# Patient Record
Sex: Male | Born: 1950 | Race: White | Hispanic: No | Smoking: Former smoker
Health system: Southern US, Community
[De-identification: ages and names within clinical notes are randomized; demographics above are authoritative.]

## PROBLEM LIST (undated history)

## (undated) DIAGNOSIS — M109 Gout, unspecified: Secondary | ICD-10-CM

## (undated) DIAGNOSIS — E7800 Pure hypercholesterolemia, unspecified: Secondary | ICD-10-CM

## (undated) DIAGNOSIS — I2510 Atherosclerotic heart disease of native coronary artery without angina pectoris: Secondary | ICD-10-CM

## (undated) DIAGNOSIS — M419 Scoliosis, unspecified: Secondary | ICD-10-CM

## (undated) DIAGNOSIS — I2699 Other pulmonary embolism without acute cor pulmonale: Secondary | ICD-10-CM

## (undated) DIAGNOSIS — I82409 Acute embolism and thrombosis of unspecified deep veins of unspecified lower extremity: Secondary | ICD-10-CM

## (undated) DIAGNOSIS — C801 Malignant (primary) neoplasm, unspecified: Secondary | ICD-10-CM

## (undated) DIAGNOSIS — J449 Chronic obstructive pulmonary disease, unspecified: Secondary | ICD-10-CM

## (undated) DIAGNOSIS — I10 Essential (primary) hypertension: Secondary | ICD-10-CM

## (undated) HISTORY — DX: Gout, unspecified: M10.9

## (undated) HISTORY — PX: NECK SURGERY: SHX720

## (undated) HISTORY — PX: SKIN CANCER EXCISION: SHX779

## (undated) HISTORY — PX: FOOT SURGERY: SHX648

---

## 2008-12-16 ENCOUNTER — Ambulatory Visit (HOSPITAL_COMMUNITY): Admission: RE | Admit: 2008-12-16 | Discharge: 2008-12-16 | Payer: Self-pay | Admitting: Family Medicine

## 2010-04-02 ENCOUNTER — Ambulatory Visit (HOSPITAL_COMMUNITY): Admission: RE | Admit: 2010-04-02 | Discharge: 2010-04-02 | Payer: Self-pay | Admitting: Family Medicine

## 2010-07-16 ENCOUNTER — Ambulatory Visit: Payer: Self-pay | Admitting: Orthopedic Surgery

## 2010-07-16 DIAGNOSIS — M6530 Trigger finger, unspecified finger: Secondary | ICD-10-CM | POA: Insufficient documentation

## 2010-07-16 DIAGNOSIS — M47812 Spondylosis without myelopathy or radiculopathy, cervical region: Secondary | ICD-10-CM | POA: Insufficient documentation

## 2010-07-24 ENCOUNTER — Encounter: Payer: Self-pay | Admitting: Orthopedic Surgery

## 2010-07-26 ENCOUNTER — Telehealth: Payer: Self-pay | Admitting: Orthopedic Surgery

## 2010-07-27 ENCOUNTER — Ambulatory Visit (HOSPITAL_COMMUNITY): Admission: RE | Admit: 2010-07-27 | Discharge: 2010-07-27 | Payer: Self-pay | Admitting: Orthopedic Surgery

## 2010-07-30 ENCOUNTER — Encounter: Payer: Self-pay | Admitting: Orthopedic Surgery

## 2010-07-31 ENCOUNTER — Encounter: Payer: Self-pay | Admitting: Orthopedic Surgery

## 2010-08-13 ENCOUNTER — Ambulatory Visit: Payer: Self-pay | Admitting: Orthopedic Surgery

## 2010-08-14 ENCOUNTER — Telehealth: Payer: Self-pay | Admitting: Orthopedic Surgery

## 2010-08-14 ENCOUNTER — Encounter: Payer: Self-pay | Admitting: Orthopedic Surgery

## 2010-08-21 ENCOUNTER — Encounter: Payer: Self-pay | Admitting: Orthopedic Surgery

## 2010-08-21 ENCOUNTER — Telehealth (INDEPENDENT_AMBULATORY_CARE_PROVIDER_SITE_OTHER): Payer: Self-pay | Admitting: *Deleted

## 2010-09-18 ENCOUNTER — Encounter: Payer: Self-pay | Admitting: Orthopedic Surgery

## 2010-09-27 ENCOUNTER — Encounter: Payer: Self-pay | Admitting: Orthopedic Surgery

## 2010-10-05 ENCOUNTER — Ambulatory Visit (HOSPITAL_COMMUNITY): Admission: RE | Admit: 2010-10-05 | Discharge: 2010-10-05 | Payer: Self-pay | Admitting: Neurosurgery

## 2010-10-30 ENCOUNTER — Encounter: Payer: Self-pay | Admitting: Orthopedic Surgery

## 2010-11-29 ENCOUNTER — Encounter: Payer: Self-pay | Admitting: Orthopedic Surgery

## 2010-12-20 NOTE — Progress Notes (Signed)
Summary: Referral to Neurosurgeon.  Phone Note Outgoing Call   Call placed by: Waldon Reining,  August 14, 2010 1:35 PM Call placed to: Specialist Action Taken: Information Sent Summary of Call: I faxed a referral for this patient to [facility] to be seen for spinal stenosis of C-spine.

## 2010-12-20 NOTE — Letter (Signed)
Summary: History form  History form   Imported By: Jacklynn Ganong 07/30/2010 15:34:13  _____________________________________________________________________  External Attachment:    Type:   Image     Comment:   External Document

## 2010-12-20 NOTE — Letter (Signed)
Summary: *Referral Letter [facility])  [facility]  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   Pertinent Labs: MRI  Thank you again for agreeing to see our patient; please contact us if you have any further questions or need additional information.  Sincerely,  Fuller Canada MD

## 2010-12-20 NOTE — Progress Notes (Signed)
Summary: Dr. Franky Macho appt 08/27/10  Phone Note Outgoing Call Call back at [facility] Phone [phone]   Summary of Call: called patient and lmom to confirm appt with Dr. Franky Macho on 08/27/10, left [facility] number for pt to call for time of appt, also gave our number for pt to call us back to confirm that he received our message, no appt time was given from [facility] appt confirmation form only Date.

## 2010-12-20 NOTE — Letter (Signed)
Summary: Insurance MRI notification  Insurance MRI notification   Imported By: Jacklynn Ganong 07/30/2010 15:47:30  _____________________________________________________________________  External Attachment:    Type:   Image     Comment:   External Document

## 2010-12-20 NOTE — Assessment & Plan Note (Signed)
Summary: mri results/disc by courier/bsf   Visit Type:  Follow-up Referring Provider:  Dr. Sherwood Gambler Primary Provider:  Dr. Sherwood Gambler   History of Present Illness: I saw Dwayne Cuevas in the office today for a followup visit.  He is a 60 years old man with the complaint of:  numbness and tingling with weakness of the right upper extremity   MRI results:  IMPRESSION: 1.  Mild spinal stenosis without mass effect on the spinal cord or spinal cord signal abnormality is multifactorial at C5-C6. Associated severe bilateral C6 foraminal stenosis. 2.  Moderate to severe multifactorial right C4 foraminal stenosis. 3.  Borderline C6-C7 spinal stenosis and mild bilateral C7 foraminal stenosis.   Read By:  Augusto Gamble,  M.D.      I have recommended a neuro surgical consult for him   Allergies: 1)  ! 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 Patient Level II (03500)  Patient Instructions: 1)  CONSULT NEURO SURGERY  2)  SPINAL STENOSIS C SPINE  3)  RETURN FOR TRIGGER THUMB IF IT CONTINUES TO LOCK

## 2010-12-20 NOTE — Consult Note (Signed)
Summary: Consult note from Dr. Coletta Memos  Consult note from Dr. Coletta Memos   Imported By: Jacklynn Ganong 09/18/2010 11:45:52  _____________________________________________________________________  External Attachment:    Type:   Image     Comment:   External Document

## 2010-12-20 NOTE — Assessment & Plan Note (Signed)
Summary: EVAL/TREAT TRIGGER FINGER RT THUMB/REF L.FUSCO/[employer]/CAF   Vital Signs:  Patient profile:   60 year old male Height:      72 inches Weight:      191 pounds Pulse rate:   70 / minute Resp:     16 per minute  Vitals Entered By: Fuller Canada MD (July 16, 2010 4:37 PM)  Visit Type:  new patient Referring Provider:  Dr. Sherwood Gambler Primary Provider:  Dr. Sherwood Gambler  CC:  right thumb pain.  History of Present Illness: I saw Dwayne Cuevas in the office today for an initial visit.  He is a 60 years old man with the complaint of:  right trigger thumb.  No xrays.  meds: Welchol, Simvastatin, Fenofibrate, HCTZ, Cialis, Loratadine.  RIGHT trigger thumb for this 60 year old male causing sharp throbbing burning moderate pain.  Basically been triggering and having pain for the last 2 months of locking.  Notes some numbness gradual onset.  Pain comes and goes.      Allergies (verified): 1)  ! 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  Review of Systems Constitutional:  Denies weight loss, weight gain, fever, chills, and fatigue. Cardiovascular:  Denies chest pain, palpitations, fainting, and murmurs. Respiratory:  Denies short of breath, wheezing, couch, tightness, pain on inspiration, and snoring . Gastrointestinal:  Complains of heartburn; denies nausea, vomiting, diarrhea, constipation, and blood in your stools. Genitourinary:  Denies frequency, urgency, difficulty urinating, painful urination, flank pain, and bleeding in urine. Neurologic:  Denies numbness, tingling, unsteady gait, dizziness, tremors, and seizure. Musculoskeletal:  Complains of joint pain; denies swelling, instability,  stiffness, redness, heat, and muscle pain. Endocrine:  Denies excessive thirst, exessive urination, and heat or cold intolerance. Psychiatric:  Denies nervousness, depression, anxiety, and hallucinations. Skin:  Denies changes in the skin, poor healing, rash, itching, and redness. HEENT:  Denies blurred or double vision, eye pain, redness, and watering. Immunology:  Complains of seasonal allergies; denies sinus problems and allergic to bee stings. Hemoatologic:  Denies easy bleeding and brusing.  Physical Exam  Additional Exam:  He has tenderness over the A1 pulley of the RIGHT thumb with catching and locking although the thumb remained stable , grip strength is normal skin is intact pulses are normal sensation is normal.  His thumb is stable  GEN: well developed, well nourished, normal grooming and hygiene, no deformity and normal body habitus.   CDV: pulses are normal, no edema, no erythema. no tenderness  Lymph: normal lymph nodes   Skin: no rashes, skin lesions or open sores   NEURO: normal coordination, reflexes, the abnormal sensation seems to be in the long finger somewhat in the palm and somewhat over the thumb suggesting C7 issue.  Psyche: awake, alert and oriented. Mood normal      Impression & Recommendations:  Problem # 1:  TRIGGER FINGER (ICD-727.03) Assessment New  Orders: New Patient Level III (81191) Injection, Tendon / Ligament (47829) Depo- Medrol 40mg  (J1030) Verbal consent was obtained: The finger was prepped with ethyl chloride and injected with 1:1 injection of .25% sensorcaine, 1cc  and 40 mg of depomedrol, 1cc. There were no complications. 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  Recommend MRI  Medications Added to Medication List This Visit: 1)  Valium 10 Mg Tabs (Diazepam) .[name] Kitchen.. 1 tablet one tablet 30 minutes before mri  Patient Instructions: 1)  MRI neck will call with results. 2)  You have received an injection of cortisone today. You may experience increased pain at the injection site. Apply ice pack to the area for 20 minutes every 2 hours and take 2 xtra strength tylenol every 8 hours. This increased pain will usually resolve in 24 hours. The injection will take effect in 3-10 days.  Prescriptions: VALIUM 10 MG TABS (DIAZEPAM) 1 tablet one tablet 30 minutes before MRI  #1 x 0   Entered and Authorized by:   Fuller Canada MD   Signed by:   Fuller Canada MD on 07/16/2010   Method used:   Print then Give to Patient   RxID:   [id number]

## 2010-12-20 NOTE — Letter (Signed)
Summary: [facility] Office notes Dr Letitia Libra Office notes Dr Franky Macho   Imported By: Cammie Sickle 11/21/2010 10:35:49  _____________________________________________________________________  External Attachment:    Type:   Image     Comment:   External Document

## 2010-12-20 NOTE — Miscellaneous (Signed)
  MRI approved by physician to physician conversationClinical Lists Changes

## 2010-12-20 NOTE — Letter (Signed)
Summary: [facility] referral appointment confirmation  [facility] referral appointment confirmation   Imported By: Jacklynn Ganong 08/21/2010 16:27:45  _____________________________________________________________________  External Attachment:    Type:   Image     Comment:   External Document

## 2010-12-20 NOTE — Progress Notes (Signed)
Summary: MRI appointment.  Phone Note Outgoing Call   Call placed by: Waldon Reining,  July 26, 2010 3:11 PM Call placed to: Patient Action Taken: Appt scheduled Summary of Call: I called and left a message with th epatient's MRI appointment at [facility] on 07-27-10 at 5:30 pm. Patient has [facility], authorization [phone]. Patient will follow up here for his results.

## 2010-12-20 NOTE — Letter (Signed)
Summary: MRI insurance notification  MRI insurance notification   Imported By: Jacklynn Ganong 07/31/2010 14:31:29  _____________________________________________________________________  External Attachment:    Type:   Image     Comment:   External Document

## 2010-12-20 NOTE — Letter (Signed)
Summary: [facility] office notes Dr Letitia Libra office notes Dr Franky Macho   Imported By: Cammie Sickle 10/12/2010 14:28:06  _____________________________________________________________________  External Attachment:    Type:   Image     Comment:   External Document

## 2011-01-24 NOTE — Letter (Signed)
Summary: [facility] office note Dr Letitia Libra office note Dr Franky Macho   Imported By: Cammie Sickle 01/15/2011 20:08:39  _____________________________________________________________________  External Attachment:    Type:   Image     Comment:   External Document

## 2011-01-29 LAB — BASIC METABOLIC PANEL
BUN: 16 mg/dL (ref 6–23)
CO2: 30 mEq/L (ref 19–32)
Calcium: 9.6 mg/dL (ref 8.4–10.5)
Chloride: 104 mEq/L (ref 96–112)
Creatinine, Ser: 1.18 mg/dL (ref 0.4–1.5)
GFR calc Af Amer: >60 mL/min (ref >60)
GFR calc non Af Amer: >60 mL/min (ref >60)
Glucose, Bld: 94 mg/dL (ref 70–99)
Potassium: 4.3 mEq/L (ref 3.5–5.1)
Sodium: 140 mEq/L (ref 135–145)

## 2011-01-29 LAB — CBC
HCT: 44.6 % (ref 39.0–52.0)
Hemoglobin: 15.1 g/dL (ref 13.0–17.0)
MCH: 30.2 pg (ref 26.0–34.0)
MCHC: 33.9 g/dL (ref 30.0–36.0)
MCV: 89.2 fL (ref 78.0–100.0)
Platelets: 289 10*3/uL (ref 150–400)
RBC: 5 MIL/uL (ref 4.22–5.81)
RDW: 12.5 % (ref 11.5–15.5)
WBC: 8.2 10*3/uL (ref 4.0–10.5)

## 2011-01-29 LAB — SURGICAL PCR SCREEN
MRSA, PCR: NEGATIVE
Staphylococcus aureus: NEGATIVE

## 2012-02-24 ENCOUNTER — Encounter (INDEPENDENT_AMBULATORY_CARE_PROVIDER_SITE_OTHER): Payer: Self-pay | Admitting: *Deleted

## 2012-03-04 ENCOUNTER — Encounter (INDEPENDENT_AMBULATORY_CARE_PROVIDER_SITE_OTHER): Payer: Self-pay

## 2012-12-10 ENCOUNTER — Other Ambulatory Visit (HOSPITAL_COMMUNITY): Payer: Self-pay | Admitting: *Deleted

## 2012-12-10 ENCOUNTER — Ambulatory Visit (HOSPITAL_COMMUNITY)
Admission: RE | Admit: 2012-12-10 | Discharge: 2012-12-10 | Disposition: A | Discharge status: Home / Self Care | Payer: PRIVATE HEALTH INSURANCE | Source: Ambulatory Visit | Attending: Orthopedic Surgery | Admitting: Orthopedic Surgery

## 2012-12-10 DIAGNOSIS — M542 Cervicalgia: Secondary | ICD-10-CM

## 2013-01-04 ENCOUNTER — Other Ambulatory Visit (HOSPITAL_COMMUNITY): Payer: Self-pay | Admitting: *Deleted

## 2013-01-04 ENCOUNTER — Ambulatory Visit (HOSPITAL_COMMUNITY)
Admission: RE | Admit: 2013-01-04 | Discharge: 2013-01-04 | Disposition: A | Discharge status: Home / Self Care | Payer: PRIVATE HEALTH INSURANCE | Source: Ambulatory Visit | Attending: Family Medicine | Admitting: Family Medicine

## 2013-01-04 DIAGNOSIS — S3992XA Unspecified injury of lower back, initial encounter: Secondary | ICD-10-CM

## 2013-01-04 DIAGNOSIS — R52 Pain, unspecified: Secondary | ICD-10-CM

## 2013-01-04 DIAGNOSIS — M549 Dorsalgia, unspecified: Secondary | ICD-10-CM

## 2013-01-04 DIAGNOSIS — M5380 Other specified dorsopathies, site unspecified: Secondary | ICD-10-CM | POA: Insufficient documentation

## 2013-01-04 DIAGNOSIS — M25579 Pain in unspecified ankle and joints of unspecified foot: Secondary | ICD-10-CM | POA: Insufficient documentation

## 2013-01-04 DIAGNOSIS — M546 Pain in thoracic spine: Secondary | ICD-10-CM | POA: Insufficient documentation

## 2013-01-04 DIAGNOSIS — M25559 Pain in unspecified hip: Secondary | ICD-10-CM | POA: Insufficient documentation

## 2016-02-05 ENCOUNTER — Inpatient Hospital Stay (HOSPITAL_COMMUNITY)
Admission: EM | Admit: 2016-02-05 | Discharge: 2016-02-07 | DRG: 299 | Disposition: A | Discharge status: Home / Self Care | Payer: Medicare Other | Source: Ambulatory Visit | Attending: Internal Medicine | Admitting: Internal Medicine

## 2016-02-05 ENCOUNTER — Emergency Department (HOSPITAL_COMMUNITY): Payer: Medicare Other

## 2016-02-05 ENCOUNTER — Ambulatory Visit (HOSPITAL_COMMUNITY)
Admission: RE | Admit: 2016-02-05 | Discharge: 2016-02-05 | Disposition: A | Discharge status: Home / Self Care | Payer: Medicare Other | Source: Ambulatory Visit | Attending: Preventative Medicine | Admitting: Preventative Medicine

## 2016-02-05 ENCOUNTER — Encounter (HOSPITAL_COMMUNITY): Payer: Self-pay | Admitting: Emergency Medicine

## 2016-02-05 ENCOUNTER — Other Ambulatory Visit: Payer: Self-pay

## 2016-02-05 ENCOUNTER — Other Ambulatory Visit (HOSPITAL_COMMUNITY): Payer: Self-pay | Admitting: Preventative Medicine

## 2016-02-05 DIAGNOSIS — R52 Pain, unspecified: Secondary | ICD-10-CM

## 2016-02-05 DIAGNOSIS — M109 Gout, unspecified: Secondary | ICD-10-CM | POA: Diagnosis not present

## 2016-02-05 DIAGNOSIS — F17210 Nicotine dependence, cigarettes, uncomplicated: Secondary | ICD-10-CM | POA: Diagnosis present

## 2016-02-05 DIAGNOSIS — I10 Essential (primary) hypertension: Secondary | ICD-10-CM | POA: Diagnosis not present

## 2016-02-05 DIAGNOSIS — I82409 Acute embolism and thrombosis of unspecified deep veins of unspecified lower extremity: Secondary | ICD-10-CM | POA: Diagnosis not present

## 2016-02-05 DIAGNOSIS — I2699 Other pulmonary embolism without acute cor pulmonale: Secondary | ICD-10-CM | POA: Diagnosis present

## 2016-02-05 DIAGNOSIS — I82401 Acute embolism and thrombosis of unspecified deep veins of right lower extremity: Principal | ICD-10-CM | POA: Diagnosis present

## 2016-02-05 DIAGNOSIS — J449 Chronic obstructive pulmonary disease, unspecified: Secondary | ICD-10-CM | POA: Diagnosis not present

## 2016-02-05 DIAGNOSIS — Z23 Encounter for immunization: Secondary | ICD-10-CM

## 2016-02-05 DIAGNOSIS — Z880 Allergy status to penicillin: Secondary | ICD-10-CM

## 2016-02-05 DIAGNOSIS — Z7401 Bed confinement status: Secondary | ICD-10-CM

## 2016-02-05 DIAGNOSIS — R609 Edema, unspecified: Secondary | ICD-10-CM

## 2016-02-05 DIAGNOSIS — M7989 Other specified soft tissue disorders: Secondary | ICD-10-CM | POA: Diagnosis not present

## 2016-02-05 DIAGNOSIS — I4510 Unspecified right bundle-branch block: Secondary | ICD-10-CM | POA: Diagnosis present

## 2016-02-05 DIAGNOSIS — I82411 Acute embolism and thrombosis of right femoral vein: Secondary | ICD-10-CM | POA: Diagnosis not present

## 2016-02-05 HISTORY — DX: Pure hypercholesterolemia, unspecified: E78.00

## 2016-02-05 HISTORY — DX: Chronic obstructive pulmonary disease, unspecified: J44.9

## 2016-02-05 HISTORY — DX: Acute embolism and thrombosis of unspecified deep veins of unspecified lower extremity: I82.409

## 2016-02-05 HISTORY — DX: Other pulmonary embolism without acute cor pulmonale: I26.99

## 2016-02-05 HISTORY — DX: Scoliosis, unspecified: M41.9

## 2016-02-05 HISTORY — DX: Essential (primary) hypertension: I10

## 2016-02-05 LAB — CBC WITH DIFFERENTIAL/PLATELET
Basophils Absolute: 0 10*3/uL (ref 0.0–0.1)
Basophils Relative: 1 %
Eosinophils Absolute: 0.2 10*3/uL (ref 0.0–0.7)
Eosinophils Relative: 2 %
HCT: 45.2 % (ref 39.0–52.0)
Hemoglobin: 15.4 g/dL (ref 13.0–17.0)
Lymphocytes Relative: 27 %
Lymphs Abs: 2.3 10*3/uL (ref 0.7–4.0)
MCH: 36.1 pg — ABNORMAL HIGH (ref 26.0–34.0)
MCHC: 34.1 g/dL (ref 30.0–36.0)
MCV: 105.9 fL — ABNORMAL HIGH (ref 78.0–100.0)
Monocytes Absolute: 0.7 10*3/uL (ref 0.1–1.0)
Monocytes Relative: 8 %
Neutro Abs: 5.5 10*3/uL (ref 1.7–7.7)
Neutrophils Relative %: 62 %
Platelets: 317 10*3/uL (ref 150–400)
RBC: 4.27 MIL/uL (ref 4.22–5.81)
RDW: 15 % (ref 11.5–15.5)
WBC: 8.7 10*3/uL (ref 4.0–10.5)

## 2016-02-05 LAB — BASIC METABOLIC PANEL
Anion gap: 8 (ref 5–15)
BUN: 11 mg/dL (ref 6–20)
CO2: 30 mmol/L (ref 22–32)
Calcium: 9 mg/dL (ref 8.9–10.3)
Chloride: 104 mmol/L (ref 101–111)
Creatinine, Ser: 0.91 mg/dL (ref 0.61–1.24)
GFR calc Af Amer: >60 mL/min (ref >60)
GFR calc non Af Amer: >60 mL/min (ref >60)
Glucose, Bld: 95 mg/dL (ref 65–99)
Potassium: 3.9 mmol/L (ref 3.5–5.1)
Sodium: 142 mmol/L (ref 135–145)

## 2016-02-05 LAB — PROTIME-INR
INR: 1.09 (ref 0.00–1.49)
Prothrombin Time: 14.3 seconds (ref 11.6–15.2)

## 2016-02-05 MED ORDER — ONDANSETRON HCL 4 MG PO TABS: 4.0000 mg | ORAL_TABLET | Freq: Four times a day (QID) | ORAL | Status: DC | PRN

## 2016-02-05 MED ORDER — ACETAMINOPHEN 650 MG RE SUPP: 650.0000 mg | Freq: Four times a day (QID) | RECTAL | Status: DC | PRN

## 2016-02-05 MED ORDER — COLCHICINE 0.6 MG PO TABS
0.6000 mg | ORAL_TABLET | Freq: Two times a day (BID) | ORAL | Status: DC
  Administered 2016-02-06 – 2016-02-07 (×3): 0.6 mg via ORAL
  Filled 2016-02-05 (×3): qty 1

## 2016-02-05 MED ORDER — RIVAROXABAN 15 MG PO TABS: 15.0000 mg | ORAL_TABLET | Freq: Once | ORAL | Status: DC

## 2016-02-05 MED ORDER — COLCHICINE 0.6 MG PO TABS
0.6000 mg | ORAL_TABLET | Freq: Two times a day (BID) | ORAL | Status: DC
  Administered 2016-02-05: 0.6 mg via ORAL
  Filled 2016-02-05: qty 1

## 2016-02-05 MED ORDER — ACETAMINOPHEN 325 MG PO TABS
650.0000 mg | ORAL_TABLET | Freq: Four times a day (QID) | ORAL | Status: DC | PRN
  Administered 2016-02-06: 650 mg via ORAL
  Filled 2016-02-05: qty 2

## 2016-02-05 MED ORDER — IOHEXOL 350 MG/ML SOLN
100.0000 mL | Freq: Once | INTRAVENOUS | Status: AC | PRN
  Administered 2016-02-05: 100 mL via INTRAVENOUS

## 2016-02-05 MED ORDER — SODIUM CHLORIDE 0.9 % IV SOLN: INTRAVENOUS | Status: DC

## 2016-02-05 MED ORDER — ONDANSETRON HCL 4 MG/2ML IJ SOLN: 4.0000 mg | Freq: Four times a day (QID) | INTRAMUSCULAR | Status: DC | PRN

## 2016-02-05 MED ORDER — HEPARIN BOLUS VIA INFUSION
4000.0000 [IU] | Freq: Once | INTRAVENOUS | Status: AC
  Administered 2016-02-05: 4000 [IU] via INTRAVENOUS

## 2016-02-05 MED ORDER — HEPARIN (PORCINE) IN NACL 100-0.45 UNIT/ML-% IJ SOLN
1500.0000 [IU]/h | INTRAMUSCULAR | Status: DC
  Administered 2016-02-05: 1000 [IU]/h via INTRAVENOUS
  Administered 2016-02-06: 1500 [IU]/h via INTRAVENOUS
  Filled 2016-02-05 (×2): qty 250

## 2016-02-05 NOTE — ED Provider Notes (Signed)
CSN: [id number]     Arrival date & time 02/05/16  1621 History   First MD Initiated Contact with Patient 02/05/16 1718     Chief Complaint  Patient presents with  . DVT     (Consider location/radiation/quality/duration/timing/severity/associated sxs/prior Treatment) HPI Dwayne Cuevas is a 65 year old man sent to the hospital for urgent care to have a Doppler of his right lower extremity. It was positive for DVT and he was subsequently sent to the emergency department for evaluation. He states that he has been having leg swelling over the past week. It has worsened in nature. He thought that it was his gout which he has had before.  He does endorse a has had some dyspnea. He has not noted any fever or chills. He denies any history of immobilization, long travel, or blood dyscrasias or bleeding diatheses. He has not had any  DVT or pulmonary embolism in the past. Past Medical History  Diagnosis Date  . DVT (deep venous thrombosis) ([place])   . Hypertension   . Hypercholesteremia   . COPD (chronic obstructive pulmonary disease) ([place])   . Scoliosis    Past Surgical History  Procedure Laterality Date  . Back surgery     No family history on file. Social History  Substance Use Topics  . Smoking status: Current Every Day Smoker -- 1.00 packs/day    Types: Cigarettes  . Smokeless tobacco: None  . Alcohol Use: No    Review of Systems  All other systems reviewed and are negative.     Allergies  Penicillins  Home Medications   Prior to Admission medications   Not on File   BP 155/75 mmHg  Pulse 77  Temp(Src) 98.7 F (37.1 C) (Oral)  Resp 16  Ht 6' (1.829 m)  Wt 84.369 kg  BMI 25.22 kg/m2  SpO2 100% Physical Exam  Constitutional: He is oriented to person, place, and time. He appears well-developed and well-nourished.  HENT:  Head: Normocephalic and atraumatic.  Right Ear: External ear normal.  Left Ear: External ear normal.  Nose: Nose normal.  Mouth/Throat: Oropharynx is  clear and moist.  Eyes: Conjunctivae and EOM are normal. Pupils are equal, round, and reactive to light.  Neck: Normal range of motion. Neck supple.  Cardiovascular: Normal rate, regular rhythm, normal heart sounds and intact distal pulses.   Pulmonary/Chest: Effort normal and breath sounds normal. No respiratory distress. He has no wheezes. He exhibits no tenderness.  Abdominal: Soft. Bowel sounds are normal. He exhibits no distension and no mass. There is no tenderness. There is no guarding.  Musculoskeletal: Normal range of motion. He exhibits edema and tenderness.  Edema and tenderness with erythema of right lower extremity from knee through the foot. Dorsal pedalis pulses are palpable.  Neurological: He is alert and oriented to person, place, and time. He has normal reflexes. He exhibits normal muscle tone. Coordination normal.  Skin: Skin is warm and dry.  Psychiatric: He has a normal mood and affect. His behavior is normal. Judgment and thought content normal.  Nursing note and vitals reviewed.   ED Course  Procedures (including critical care time) Labs Review Labs Reviewed  CBC WITH DIFFERENTIAL/PLATELET - Abnormal; Notable for the following:    MCV 105.9 (*)    MCH 36.1 (*)    All other components within normal limits  BASIC METABOLIC PANEL  PROTIME-INR    Imaging Review Ct Angio Chest Pe W/cm &/or Wo Cm  02/05/2016  CLINICAL DATA:  Diagnosed with DVT  today. Right leg swelling for 2 weeks. Mild shortness of breath. EXAM: CT ANGIOGRAPHY CHEST WITH CONTRAST TECHNIQUE: Multidetector CT imaging of the chest was performed using the standard protocol during bolus administration of intravenous contrast. Multiplanar CT image reconstructions and MIPs were obtained to evaluate the vascular anatomy. CONTRAST:  148mL OMNIPAQUE IOHEXOL 350 MG/ML SOLN COMPARISON:  None. FINDINGS: Technically adequate study with good opacification of the central and segmental pulmonary arteries. Large filling  defect demonstrated in the distal right main pulmonary artery, extending into multiple upper and lower lobe branches. Appearance is consistent with acute pulmonary embolus. RV to LV ratio is less than 1 suggesting no evidence of right heart strain. Normal heart size. Normal caliber thoracic aorta. No aortic dissection. Great vessel origins are patent. Calcification of the thoracic aorta and coronary arteries. Scattered lymph nodes in mediastinum are not pathologically enlarged and likely reactive. Esophagus is decompressed. Small right pleural effusion with patchy infiltration in the right lower lung possibly representing infarcts. Mild emphysematous changes in the lung apices with scattered peripheral fibrosis. No focal consolidation. No pneumothorax. Airways appear patent. Included portions of the upper abdominal organs demonstrate diffuse fatty infiltration of the liver. Vague focal lesion demonstrated in segment 7 measuring 11 mm diameter. This is of indeterminate etiology cannot be characterized on this study. Degenerative changes throughout the spine. Postoperative changes in the lower cervical spine. No destructive bone lesions. Review of the MIP images confirms the above findings. IMPRESSION: Positive examination for pulmonary embolus with involvement of the distal right main pulmonary artery and multiple upper and lower lobe branches. Small right pleural effusion with patchy infiltration may indicate pulmonary infarct. No evidence of right heart strain. These results were called by telephone at the time of interpretation on 02/05/2016 at 7:08 pm to Dr. Pattricia Boss , who verbally acknowledged these results. Electronically Signed   By: Lucienne Capers M.D.   On: 02/05/2016 19:11   US Venous Img Lower Unilateral Right  02/05/2016  CLINICAL DATA:  Right lower extremity pain and edema for the past 2 weeks. History of smoking and varicose veins. Evaluate for DVT. EXAM: RIGHT LOWER EXTREMITY VENOUS DOPPLER  ULTRASOUND TECHNIQUE: Gray-scale sonography with graded compression, as well as color Doppler and duplex ultrasound were performed to evaluate the lower extremity deep venous systems from the level of the common femoral vein and including the common femoral, femoral, profunda femoral, popliteal and calf veins including the posterior tibial, peroneal and gastrocnemius veins when visible. The superficial great saphenous vein was also interrogated. Spectral Doppler was utilized to evaluate flow at rest and with distal augmentation maneuvers in the common femoral, femoral and popliteal veins. COMPARISON:  None. FINDINGS: There is hypoechoic occlusive thrombus extending from the level of the right common femoral vein throughout the entirety of the imaged portions of the right lower extremity venous system, including the saphenofemoral junction and imaged portions of the right greater saphenous vein. The contralateral left common femoral vein appears patent were imaged. Other Findings:  None. IMPRESSION: The examination is positive for extensive occlusive right lower extremity DVT extending from the right common femoral vein to involve all imaged venous structures of the right lower extremity. These results will be called to the ordering clinician or representative by the Radiologist Assistant, and communication documented in the PACS or zVision Dashboard. Electronically Signed   By: Sandi Mariscal M.D.   On: 02/05/2016 15:22   I have personally reviewed and evaluated these images and lab results as part of my medical  decision-making.   EKG Interpretation None      MDM   Final diagnoses:  Other acute pulmonary embolism without acute cor pulmonale ([place])   65 year old man with DVT presents to the ED for treatment. CT angiogram of the chest is significant for pulmonary embolism about heart strain. Patient is started on heparin and was admitted to hospitalist. He remained hemodynamically stable here in the ED. EKG  is currently pending   Pattricia Boss, MD 02/05/16 2210

## 2016-02-05 NOTE — ED Notes (Signed)
Dinner tray given to patient

## 2016-02-05 NOTE — ED Notes (Signed)
Pt sent over by [facility] for a DVT in RT leg. Pt had [place] earlier today. Pt reports mild SOB and RT leg pain/swelling.

## 2016-02-05 NOTE — Progress Notes (Signed)
ANTICOAGULATION CONSULT NOTE - Initial Consult  Pharmacy Consult for Heparin Indication: pulmonary embolus  Allergies  Allergen Reactions  . Penicillins Rash    Has patient had a PCN reaction causing immediate rash, facial/tongue/throat swelling, SOB or lightheadedness with hypotension: Yes Has patient had a PCN reaction causing severe rash involving mucus membranes or skin necrosis: No Has patient had a PCN reaction that required hospitalization No Has patient had a PCN reaction occurring within the last 10 years: No If all of the above answers are "NO", then may proceed with Cephalosporin use.     Patient Measurements: Height: 6' (182.9 cm) Weight: 186 lb (84.369 kg) IBW/kg (Calculated) : 77.6 HEPARIN DW (KG): 84.4  Vital Signs: Temp: 98.7 F (37.1 C) (03/20 1703) Temp Source: Oral (03/20 1703) BP: 155/75 mmHg (03/20 1830) Pulse Rate: 77 (03/20 1830)  Labs:  Recent Labs  02/05/16 1720  HGB 15.4  HCT 45.2  PLT 317  LABPROT 14.3  INR 1.09  CREATININE 0.91    Estimated Creatinine Clearance: 90 mL/min (by C-G formula based on Cr of 0.91).   Medical History: Past Medical History  Diagnosis Date  . DVT (deep venous thrombosis) ([place])   . Hypertension   . Hypercholesteremia   . COPD (chronic obstructive pulmonary disease) ([place])   . Scoliosis    Assessment: Heparin for PE  BASELINE LABS OK   Goal of Therapy:  Monitor platelets by anticoagulation protocol: Yes Heparin level 0.3-0.7  Plan:  Heparin 4000 units given in ED Heparin infusion at 1250 units/hr Heparin level and CBC daily  [place] Crane, Taralynn Quiett A 02/05/2016,8:13 PM

## 2016-02-05 NOTE — H&P (Signed)
PCP:   No PCP Per Patient   Chief Complaint:  Leg swelling  HPI:  65 year old male who  has a past medical history of DVT (deep venous thrombosis) ([place]); Hypertension; Hypercholesteremia; COPD (chronic obstructive pulmonary disease) ([place]); and Scoliosis. today presents to the hospital with chief complaint of right leg swelling and shortness of breath. Patient says that he developed right leg swelling 2 weeks ago, and experienced pain in the leg which moved up to the back of the thigh and hip. Today he also noticed some shortness of breath so he came to the ED for further evaluation. Ultrasound of the lower extremities revealed right lower extremity DVT. CT angiogram showed pulmonary embolus with involvement of distal right main pulmonary artery and multiple upper and lower lobe branches. Patient started IV heparin. He denies recent long distance journey. No recent surgery. But admits to being  bedbound after Christmas eve at least for 2 weeks due to upper respiratory infection.  Patient is not requiring oxygen, currently not short of breath. Complains of chest pain on deep breathing under the left chest wall.  Allergies:   Allergies  Allergen Reactions  . Penicillins Rash    Has patient had a PCN reaction causing immediate rash, facial/tongue/throat swelling, SOB or lightheadedness with hypotension: Yes Has patient had a PCN reaction causing severe rash involving mucus membranes or skin necrosis: No Has patient had a PCN reaction that required hospitalization No Has patient had a PCN reaction occurring within the last 10 years: No If all of the above answers are "NO", then may proceed with Cephalosporin use.       Past Medical History  Diagnosis Date  . DVT (deep venous thrombosis) ([place])   . Hypertension   . Hypercholesteremia   . COPD (chronic obstructive pulmonary disease) ([place])   . Scoliosis     Past Surgical History  Procedure Laterality Date  . Back surgery      Prior  to Admission medications   Not on File    Social History:  reports that he has been smoking Cigarettes.  He has been smoking about 1.00 pack per day. He does not have any smokeless tobacco history on file. He reports that he does not drink alcohol. His drug history is not on file.    Filed Weights   02/05/16 1703  Weight: 84.369 kg (186 lb)    All the positives are listed in BOLD  Review of Systems:  HEENT: Headache, blurred vision, runny nose, sore throat Neck: Hypothyroidism, hyperthyroidism,,lymphadenopathy Chest : Shortness of breath, history of COPD, Asthma Heart : Chest pain, history of coronary arterey disease GI:  Nausea, vomiting, diarrhea, constipation, GERD GU: Dysuria, urgency, frequency of urination, hematuria Neuro: Stroke, seizures, syncope Psych: Depression, anxiety, hallucinations   Physical Exam: Blood pressure 155/75, pulse 77, temperature 98.7 F (37.1 C), temperature source Oral, resp. rate 16, height 6' (1.829 m), weight 84.369 kg (186 lb), SpO2 100 %. Constitutional:   Patient is a well-developed and well-nourished male* in no acute distress and cooperative with exam. Head: Normocephalic and atraumatic Mouth: Mucus membranes moist Eyes: PERRL, EOMI, conjunctivae normal Neck: Supple, No Thyromegaly Cardiovascular: RRR, S1 normal, S2 normal Pulmonary/Chest: CTAB, no wheezes, rales, or rhonchi Abdominal: Soft. Non-tender, non-distended, bowel sounds are normal, no masses, organomegaly, or guarding present.  Neurological: A&O x3, Strength is normal and symmetric bilaterally, cranial nerve II-XII are grossly intact, no focal motor deficit, sensory intact to light touch bilaterally.  Extremities : No Cyanosis, Clubbing or  Edema. Erythema noted in the left foot second digit.  Labs on Admission:  Basic Metabolic Panel:  Recent Labs Lab 02/05/16 1720  NA 142  K 3.9  CL 104  CO2 30  GLUCOSE 95  BUN 11  CREATININE 0.91  CALCIUM 9.0   CBC:  Recent  Labs Lab 02/05/16 1720  WBC 8.7  NEUTROABS 5.5  HGB 15.4  HCT 45.2  MCV 105.9*  PLT 317   Given reported Radiological Exams on Admission: Ct Angio Chest Pe W/cm &/or Wo Cm  02/05/2016  CLINICAL DATA:  Diagnosed with DVT today. Right leg swelling for 2 weeks. Mild shortness of breath. EXAM: CT ANGIOGRAPHY CHEST WITH CONTRAST TECHNIQUE: Multidetector CT imaging of the chest was performed using the standard protocol during bolus administration of intravenous contrast. Multiplanar CT image reconstructions and MIPs were obtained to evaluate the vascular anatomy. CONTRAST:  137mL OMNIPAQUE IOHEXOL 350 MG/ML SOLN COMPARISON:  None. FINDINGS: Technically adequate study with good opacification of the central and segmental pulmonary arteries. Large filling defect demonstrated in the distal right main pulmonary artery, extending into multiple upper and lower lobe branches. Appearance is consistent with acute pulmonary embolus. RV to LV ratio is less than 1 suggesting no evidence of right heart strain. Normal heart size. Normal caliber thoracic aorta. No aortic dissection. Great vessel origins are patent. Calcification of the thoracic aorta and coronary arteries. Scattered lymph nodes in mediastinum are not pathologically enlarged and likely reactive. Esophagus is decompressed. Small right pleural effusion with patchy infiltration in the right lower lung possibly representing infarcts. Mild emphysematous changes in the lung apices with scattered peripheral fibrosis. No focal consolidation. No pneumothorax. Airways appear patent. Included portions of the upper abdominal organs demonstrate diffuse fatty infiltration of the liver. Vague focal lesion demonstrated in segment 7 measuring 11 mm diameter. This is of indeterminate etiology cannot be characterized on this study. Degenerative changes throughout the spine. Postoperative changes in the lower cervical spine. No destructive bone lesions. Review of the MIP images  confirms the above findings. IMPRESSION: Positive examination for pulmonary embolus with involvement of the distal right main pulmonary artery and multiple upper and lower lobe branches. Small right pleural effusion with patchy infiltration may indicate pulmonary infarct. No evidence of right heart strain. These results were called by telephone at the time of interpretation on 02/05/2016 at 7:08 pm to Dr. Pattricia Boss , who verbally acknowledged these results. Electronically Signed   By: Lucienne Capers M.D.   On: 02/05/2016 19:11   US Venous Img Lower Unilateral Right  02/05/2016  CLINICAL DATA:  Right lower extremity pain and edema for the past 2 weeks. History of smoking and varicose veins. Evaluate for DVT. EXAM: RIGHT LOWER EXTREMITY VENOUS DOPPLER ULTRASOUND TECHNIQUE: Gray-scale sonography with graded compression, as well as color Doppler and duplex ultrasound were performed to evaluate the lower extremity deep venous systems from the level of the common femoral vein and including the common femoral, femoral, profunda femoral, popliteal and calf veins including the posterior tibial, peroneal and gastrocnemius veins when visible. The superficial great saphenous vein was also interrogated. Spectral Doppler was utilized to evaluate flow at rest and with distal augmentation maneuvers in the common femoral, femoral and popliteal veins. COMPARISON:  None. FINDINGS: There is hypoechoic occlusive thrombus extending from the level of the right common femoral vein throughout the entirety of the imaged portions of the right lower extremity venous system, including the saphenofemoral junction and imaged portions of the right greater saphenous vein. The  contralateral left common femoral vein appears patent were imaged. Other Findings:  None. IMPRESSION: The examination is positive for extensive occlusive right lower extremity DVT extending from the right common femoral vein to involve all imaged venous structures of  the right lower extremity. These results will be called to the ordering clinician or representative by the Radiologist Assistant, and communication documented in the PACS or zVision Dashboard. Electronically Signed   By: Sandi Mariscal M.D.   On: 02/05/2016 15:22       Assessment/Plan Active Problems:   Pulmonary emboli ([place])   Pulmonary embolism ([place])  Pulmonary embolism We'll admit the patient, start IV heparin per pharmacy consultation. Once stabilized, patient will need to be given the option for Coumadin versus and NOAC.  Gout Start colchicine 0.1 mg twice a day  Code status: Full code  Family discussion: No family at bedside   Time Spent on Admission: 60 min  [facility] Pager: [phone] 02/05/2016, 7:41 PM  If 7PM-7AM, please contact night-coverage  [email or web address]  Password [email or web address]

## 2016-02-05 NOTE — ED Notes (Signed)
MD at bedside. 

## 2016-02-06 ENCOUNTER — Encounter (HOSPITAL_COMMUNITY): Payer: Self-pay | Admitting: Family Medicine

## 2016-02-06 DIAGNOSIS — F17210 Nicotine dependence, cigarettes, uncomplicated: Secondary | ICD-10-CM | POA: Diagnosis present

## 2016-02-06 DIAGNOSIS — I10 Essential (primary) hypertension: Secondary | ICD-10-CM | POA: Diagnosis present

## 2016-02-06 DIAGNOSIS — I824Y2 Acute embolism and thrombosis of unspecified deep veins of left proximal lower extremity: Secondary | ICD-10-CM

## 2016-02-06 DIAGNOSIS — I2699 Other pulmonary embolism without acute cor pulmonale: Secondary | ICD-10-CM | POA: Diagnosis not present

## 2016-02-06 DIAGNOSIS — Z23 Encounter for immunization: Secondary | ICD-10-CM | POA: Diagnosis not present

## 2016-02-06 DIAGNOSIS — I82401 Acute embolism and thrombosis of unspecified deep veins of right lower extremity: Secondary | ICD-10-CM | POA: Diagnosis not present

## 2016-02-06 DIAGNOSIS — I82409 Acute embolism and thrombosis of unspecified deep veins of unspecified lower extremity: Secondary | ICD-10-CM

## 2016-02-06 DIAGNOSIS — I4510 Unspecified right bundle-branch block: Secondary | ICD-10-CM | POA: Diagnosis present

## 2016-02-06 DIAGNOSIS — Z7401 Bed confinement status: Secondary | ICD-10-CM | POA: Diagnosis not present

## 2016-02-06 DIAGNOSIS — J449 Chronic obstructive pulmonary disease, unspecified: Secondary | ICD-10-CM | POA: Diagnosis present

## 2016-02-06 DIAGNOSIS — Z880 Allergy status to penicillin: Secondary | ICD-10-CM | POA: Diagnosis not present

## 2016-02-06 DIAGNOSIS — M109 Gout, unspecified: Secondary | ICD-10-CM | POA: Diagnosis present

## 2016-02-06 LAB — COMPREHENSIVE METABOLIC PANEL
ALT: 9 U/L — ABNORMAL LOW (ref 17–63)
AST: 10 U/L — ABNORMAL LOW (ref 15–41)
Albumin: 3.3 g/dL — ABNORMAL LOW (ref 3.5–5.0)
Alkaline Phosphatase: 71 U/L (ref 38–126)
Anion gap: 6 (ref 5–15)
BUN: 10 mg/dL (ref 6–20)
CO2: 29 mmol/L (ref 22–32)
Calcium: 8.8 mg/dL — ABNORMAL LOW (ref 8.9–10.3)
Chloride: 106 mmol/L (ref 101–111)
Creatinine, Ser: 0.84 mg/dL (ref 0.61–1.24)
GFR calc Af Amer: >60 mL/min (ref >60)
GFR calc non Af Amer: >60 mL/min (ref >60)
Glucose, Bld: 98 mg/dL (ref 65–99)
Potassium: 4.2 mmol/L (ref 3.5–5.1)
Sodium: 141 mmol/L (ref 135–145)
Total Bilirubin: 0.8 mg/dL (ref 0.3–1.2)
Total Protein: 5.9 g/dL — ABNORMAL LOW (ref 6.5–8.1)

## 2016-02-06 LAB — CBC
HCT: 42.4 % (ref 39.0–52.0)
Hemoglobin: 14.5 g/dL (ref 13.0–17.0)
MCH: 36 pg — ABNORMAL HIGH (ref 26.0–34.0)
MCHC: 34.2 g/dL (ref 30.0–36.0)
MCV: 105.2 fL — ABNORMAL HIGH (ref 78.0–100.0)
Platelets: 292 10*3/uL (ref 150–400)
RBC: 4.03 MIL/uL — ABNORMAL LOW (ref 4.22–5.81)
RDW: 14.4 % (ref 11.5–15.5)
WBC: 7 10*3/uL (ref 4.0–10.5)

## 2016-02-06 LAB — HEPARIN LEVEL (UNFRACTIONATED)
Heparin Unfractionated: <0.1 IU/mL — ABNORMAL LOW (ref 0.30–0.70)
Heparin Unfractionated: 0.24 IU/mL — ABNORMAL LOW (ref 0.30–0.70)

## 2016-02-06 MED ORDER — PNEUMOCOCCAL VAC POLYVALENT 25 MCG/0.5ML IJ INJ
0.5000 mL | INJECTION | INTRAMUSCULAR | Status: AC
  Administered 2016-02-07: 0.5 mL via INTRAMUSCULAR
  Filled 2016-02-06: qty 0.5

## 2016-02-06 MED ORDER — HEPARIN BOLUS VIA INFUSION
2500.0000 [IU] | Freq: Once | INTRAVENOUS | Status: AC
  Administered 2016-02-06: 2500 [IU] via INTRAVENOUS
  Filled 2016-02-06: qty 2500

## 2016-02-06 MED ORDER — ACETAMINOPHEN 325 MG PO TABS: 650.0000 mg | ORAL_TABLET | Freq: Four times a day (QID) | ORAL | Status: DC | PRN

## 2016-02-06 MED ORDER — ENOXAPARIN SODIUM 80 MG/0.8ML ~~LOC~~ SOLN
80.0000 mg | Freq: Two times a day (BID) | SUBCUTANEOUS | Status: DC
  Administered 2016-02-06 – 2016-02-07 (×2): 80 mg via SUBCUTANEOUS
  Filled 2016-02-06 (×2): qty 0.8

## 2016-02-06 MED ORDER — WARFARIN - PHARMACIST DOSING INPATIENT: Freq: Every day | Status: DC

## 2016-02-06 MED ORDER — HYDROCODONE-ACETAMINOPHEN 5-325 MG PO TABS
1.0000 | ORAL_TABLET | Freq: Four times a day (QID) | ORAL | Status: DC | PRN
Start: 2016-02-06 — End: 2016-02-07
  Administered 2016-02-06: 2 via ORAL
  Administered 2016-02-06: 1 via ORAL
  Administered 2016-02-06 – 2016-02-07 (×2): 2 via ORAL
  Filled 2016-02-06 (×4): qty 2

## 2016-02-06 MED ORDER — WARFARIN SODIUM 5 MG PO TABS
7.5000 mg | ORAL_TABLET | Freq: Once | ORAL | Status: AC
  Administered 2016-02-06: 7.5 mg via ORAL
  Filled 2016-02-06: qty 2

## 2016-02-06 MED ORDER — INFLUENZA VAC SPLIT QUAD 0.5 ML IM SUSY
0.5000 mL | PREFILLED_SYRINGE | INTRAMUSCULAR | Status: AC
  Administered 2016-02-07: 0.5 mL via INTRAMUSCULAR
  Filled 2016-02-06: qty 0.5

## 2016-02-06 NOTE — ED Notes (Signed)
Patient c/o right leg pain, hospitalist notified.

## 2016-02-06 NOTE — Progress Notes (Signed)
ANTICOAGULATION CONSULT NOTE - Initial Consult  Pharmacy Consult for lovenox and coumadin Indication: pulmonary embolus  Allergies  Allergen Reactions  . Penicillins Rash    Has patient had a PCN reaction causing immediate rash, facial/tongue/throat swelling, SOB or lightheadedness with hypotension: Yes Has patient had a PCN reaction causing severe rash involving mucus membranes or skin necrosis: No Has patient had a PCN reaction that required hospitalization No Has patient had a PCN reaction occurring within the last 10 years: No If all of the above answers are "NO", then may proceed with Cephalosporin use.     Patient Measurements: Height: 6' (182.9 cm) Weight: 179 lb 6.4 oz (81.375 kg) IBW/kg (Calculated) : 77.6  Vital Signs: Temp: 98 F (36.7 C) (03/21 1300) Temp Source: Oral (03/21 1300) BP: 148/62 mmHg (03/21 1300) Pulse Rate: 62 (03/21 1300)  Labs:  Recent Labs  02/05/16 1720 02/06/16 0351 02/06/16 1812  HGB 15.4 14.5  --   HCT 45.2 42.4  --   PLT 317 292  --   LABPROT 14.3  --   --   INR 1.09  --   --   HEPARINUNFRC  --  <0.10* 0.24*  CREATININE 0.91 0.84  --     Estimated Creatinine Clearance: 97.5 mL/min (by C-G formula based on Cr of 0.84).   Medical History: Past Medical History  Diagnosis Date  . DVT (deep venous thrombosis) ([place]) 02/05/2016  . Hypertension   . Hypercholesteremia   . COPD (chronic obstructive pulmonary disease) ([place])   . Scoliosis   . PE (pulmonary embolism) 02/05/2016    Medications:  See med rec  Assessment: 65 yo male who presented to the ED with right leg swelling and SOB. US of the leg revealed RLE DVT. CT angiogram show pulmonary embolus. Patient initially started on heparin now transitioning to lovenox and coumadin. Goal of Therapy:  INR 2-3 Monitor platelets by anticoagulation protocol: Yes   Plan:  Lovenox 80mg  (1MG /KG)sq every 12 hours starting 1 hour after heparin infusion stopped. Coumadin 7.5mg  x 1  today PT/INR daily Monitor for S/S of bleeding  [name], BS [name], BCPS Clinical Pharmacist Pager [phone] 02/06/2016,7:26 PM

## 2016-02-06 NOTE — Progress Notes (Signed)
ANTICOAGULATION CONSULT NOTE - Initial Consult  Pharmacy Consult for Heparin Indication: pulmonary embolus  Allergies  Allergen Reactions  . Penicillins Rash    Has patient had a PCN reaction causing immediate rash, facial/tongue/throat swelling, SOB or lightheadedness with hypotension: Yes Has patient had a PCN reaction causing severe rash involving mucus membranes or skin necrosis: No Has patient had a PCN reaction that required hospitalization No Has patient had a PCN reaction occurring within the last 10 years: No If all of the above answers are "NO", then may proceed with Cephalosporin use.     Patient Measurements: Height: 6' (182.9 cm) Weight: 179 lb 6.4 oz (81.375 kg) IBW/kg (Calculated) : 77.6 HEPARIN DW (KG): 81.4  Vital Signs: Temp: 97.8 F (36.6 C) (03/21 1041) Temp Source: Oral (03/21 1041) BP: 153/71 mmHg (03/21 1041) Pulse Rate: 68 (03/21 1041)  Labs:  Recent Labs  02/05/16 1720 02/06/16 0351  HGB 15.4 14.5  HCT 45.2 42.4  PLT 317 292  LABPROT 14.3  --   INR 1.09  --   HEPARINUNFRC  --  <0.10*  CREATININE 0.91 0.84    Estimated Creatinine Clearance: 97.5 mL/min (by C-G formula based on Cr of 0.84).   Medical History: Past Medical History  Diagnosis Date  . DVT (deep venous thrombosis) ([place]) 02/05/2016  . Hypertension   . Hypercholesteremia   . COPD (chronic obstructive pulmonary disease) ([place])   . Scoliosis   . PE (pulmonary embolism) 02/05/2016   Assessment: Heparin for PE  BASELINE LABS OK   Goal of Therapy:  Monitor platelets by anticoagulation protocol: Yes Heparin level 0.3-0.7  Plan:  Heparin 2500 units Increase Heparin infusion at 1500 units/hr Heparin level in 6-8 hours Heparin level and CBC daily  [name], BS [name], BCPS Clinical Pharmacist Pager [phone] 02/06/2016,10:49 AM

## 2016-02-06 NOTE — Progress Notes (Addendum)
PROGRESS NOTE  Dwayne Cuevas [id number] DOB: 1951/09/18 DOA: 02/05/2016 PCP: No PCP Per Patient  Summary: 84 yom presented with right leg swelling and shortness of breath. US of the BLE revealed RLE DVT. CT angiogram showed pulmonary embolus with involvement of distal right main pulmonary artery and multiple upper and lower lobe branches. Patient started IV heparin and admitted for further treatment.   Assessment/Plan: 1. Pulmonary embolism distal right main pulmonary artery, multiple upper and lower lobe branches. Possible pulmonary infarct. No evidence of heart strain. Hemodynamics stable. No hypoxia. 2. Extensive occlusive right lower extremity DVT. Perfusion in foot intact. Without massive edema. 3. RBBB, chronicity unknown . Adjacent matted. 4. COPD. Stable.  5. Gout, continue medications.    Continue IV heparin   Discussed various anticoagulants and risk/benefit with patient, he elects warfarin.   Code Status: Full DVT prophylaxis: Heparin  Family Communication: No family bedside  Disposition Plan: Anticipate discharge in 2-3 days.   Murray Hodgkins, MD  [facility] Direct contact:  --Via amion app OR  --[email or web address]; password [email or web address] and click  [email or web address] contact night coverage as above 02/06/2016, 7:25 AM  LOS: 1 day   Consultants:  None  Procedures:  None  Antibiotics:  None  HPI/Subjective: Feeling ok. Pain in his leg has improved with pain medications. Breathing is ok, occasionally difficulty breathing. DOE while ambulating to the restroom. Denies any pain or discomfort in chest. Denies hx DVT, recent travel, and surgeries. He reports that end of December he was bedridden due to infection for two weeks.  Recent gout. Last night noted swelling. Toes are numb on foot for last couple of weeks.  Objective: Filed Vitals:   02/06/16 0445 02/06/16 0530 02/06/16 0600 02/06/16 0630  BP:  120/69 143/55 150/72  Pulse: 69 68 63 66  Temp:      TempSrc:        Resp: 15 14 14 15   Height:      Weight:      SpO2: 96% 95% 95% 96%      Filed Weights   02/05/16 1703  Weight: 84.369 kg (186 lb)    Exam:  Afebrile, VSS, no hypoxia  General:  Appears calm and comfortable.  Cardiovascular: RRR, no m/r/g. Telemetry: SR, no arrhythmias  Respiratory: CTA bilaterally, no w/r/r. Normal respiratory effort.  Musculoskeletal: LLE no edema but there is erythema noted on the tip of 3rd toe. RLE  2+ edema up to the knee 1+ edema posterior thigh. Psychiatric: grossly normal mood and affect, speech fluent and appropriate  New data reviewed:  BMP and CBC unremarkable  Pertinent data since admission:  CT scan: Positive examination for pulmonary embolus with involvement of the distal right main pulmonary artery and multiple upper and lower lobe branches. Small right pleural effusion with patchy infiltration may indicate pulmonary infarct. No evidence of right heart strain.  [place]: The examination is positive for extensive occlusive right lower extremity DVT extending from the right common femoral vein to involve all imaged venous structures of the right lower extremity.   Scheduled Meds: . colchicine  0.6 mg Oral BID   Continuous Infusions: . sodium chloride    . heparin 1,250 Units/hr (02/05/16 2110)    Principal Problem:   Pulmonary emboli (HCC) Active Problems:   DVT (deep venous thrombosis) ([place])   Time spent: 35 minutes, greater than 50% in counseling and coordination of care     By signing my name below, I, Rennis Harding attest that this documentation has been prepared  under the direction and in the presence of Murray Hodgkins, MD Electronically signed: Rennis Harding  02/06/2016 11:53AM  I personally performed the services described in this documentation. All medical record entries made by the scribe were at my direction. I have reviewed the chart and agree that the record reflects my personal performance and is accurate and  complete. Murray Hodgkins, MD

## 2016-02-07 DIAGNOSIS — I82401 Acute embolism and thrombosis of unspecified deep veins of right lower extremity: Principal | ICD-10-CM

## 2016-02-07 DIAGNOSIS — I2699 Other pulmonary embolism without acute cor pulmonale: Secondary | ICD-10-CM

## 2016-02-07 LAB — PROTIME-INR
INR: 1.11 (ref 0.00–1.49)
Prothrombin Time: 14.5 seconds (ref 11.6–15.2)

## 2016-02-07 MED ORDER — WARFARIN SODIUM 5 MG PO TABS: 7.5000 mg | ORAL_TABLET | Freq: Once | ORAL | Status: DC

## 2016-02-07 MED ORDER — APIXABAN 5 MG PO TABS: 10.0000 mg | ORAL_TABLET | Freq: Two times a day (BID) | ORAL | Status: DC

## 2016-02-07 MED ORDER — ALLOPURINOL 300 MG PO TABS: 300.0000 mg | ORAL_TABLET | Freq: Every day | ORAL | Status: DC

## 2016-02-07 MED ORDER — APIXABAN 5 MG PO TABS: 5.0000 mg | ORAL_TABLET | Freq: Two times a day (BID) | ORAL | Status: DC

## 2016-02-07 MED ORDER — COLCHICINE 0.6 MG PO TABS: 0.6000 mg | ORAL_TABLET | Freq: Two times a day (BID) | ORAL | Status: DC

## 2016-02-07 NOTE — Care Management Important Message (Signed)
Important Message  Patient Details  Name: Chantry Torchio MRN: [id number] Date of Birth: 1951-11-05   Medicare Important Message Given:  N/A - LOS <3 / Initial given by admissions    Sherald Barge, RN 02/07/2016, 1:31 PM

## 2016-02-07 NOTE — Plan of Care (Signed)
Problem: Consults Goal: Diagnosis - Venous Thromboembolism (VTE) Choose a selection  Outcome: Progressing PE (Pulmonary Embolism)     

## 2016-02-07 NOTE — Progress Notes (Signed)
Patient alert and oriented, independent, VSS, pt. Tolerating diet well. No complaints of pain or nausea. Pt. Had IV removed tip intact. Pt. Had prescriptions given. Pt. Voiced understanding of discharge instructions with no further questions. Pt. Discharged via wheelchair with auxilliary.  

## 2016-02-07 NOTE — Care Management Note (Signed)
Case Management Note  Patient Details  Name: Dwayne Cuevas MRN: [id number] Date of Birth: 1950/11/22  Subjective/Objective:                  Pt is from home, lives alone and is ind with ADL's. Pt has insurance but no drug coverage. Pt will need anticoagulation. Elequis is appropriate for pt and per drug company pt is eligible for financial assistance. Pt has been to Dr. Orson Ape in the past but he has since retired. Larene Pickett states he will be considerd a new pt and they are not accepting new pt's.   Action/Plan: Pt given voucher for first 30 days free and financial aid applications to have his PCP complete and send in. With pt's permission, appointment made at the [facility], appointment time placed on AVS. Anticipate DC home today. No further CM needs at this time.   Expected Discharge Date:    02/07/2016             Expected Discharge Plan:  Home/Self Care  In-House Referral:  NA  Discharge planning Services  CM Consult  Post Acute Care Choice:  NA Choice offered to:  NA  DME Arranged:    DME Agency:     HH Arranged:    HH Agency:     Status of Service:  Completed, signed off  Medicare Important Message Given:    Date Medicare IM Given:    Medicare IM give by:    Date Additional Medicare IM Given:    Additional Medicare Important Message give by:     If discussed at [place] of Stay Meetings, dates discussed:    Additional Comments:  Sherald Barge, RN 02/07/2016, 1:28 PM

## 2016-02-07 NOTE — Discharge Summary (Signed)
Physician Discharge Summary  Dwayne Cuevas [id number] DOB: Sep 05, 1951 DOA: 02/05/2016  PCP: No PCP Per Patient  Admit date: 02/05/2016 Discharge date: 02/07/2016  Time spent: 45 minutes  Recommendations for Outpatient Follow-up:  -Will be discharged home today. -Has appointment with new PCP scheduled for 3/29. -We'll be discharged on Eliquis for treatment of his PE/DVT.   Discharge Diagnoses:  Principal Problem:   Pulmonary emboli [facility]) Active Problems:   DVT (deep venous thrombosis) ([place])   Discharge Condition: Stable and improved  Filed Weights   02/05/16 1703 02/06/16 1041  Weight: 84.369 kg (186 lb) 81.375 kg (179 lb 6.4 oz)    History of present illness:  As per Dr. Darrick Cuevas on 3/20: 65 year old male who  has a past medical history of DVT (deep venous thrombosis) ([place]); Hypertension; Hypercholesteremia; COPD (chronic obstructive pulmonary disease) ([place]); and Scoliosis. today presents to the hospital with chief complaint of right leg swelling and shortness of breath. Patient says that he developed right leg swelling 2 weeks ago, and experienced pain in the leg which moved up to the back of the thigh and hip. Today he also noticed some shortness of breath so he came to the ED for further evaluation. Ultrasound of the lower extremities revealed right lower extremity DVT. CT angiogram showed pulmonary embolus with involvement of distal right main pulmonary artery and multiple upper and lower lobe branches. Patient started IV heparin. He denies recent long distance journey. No recent surgery. But admits to being bedbound after Christmas eve at least for 2 weeks due to upper respiratory infection.  Patient is not requiring oxygen, currently not short of breath. Complains of chest pain on deep breathing under the left chest wall.  Hospital Course:   Pulmonary embolism/DVT right LE -Hemodynamically stable, no current oxygen requirements, no evidence of right heart strain. -Also  with extensive occlusive right lower extremity DVT with intact perfusion to the foot. -Plan to discharge on Eliquis. -Case management has ensured that he will be able to afford this drug after discharge.  Procedures:  None   Consultations: None   Discharge Instructions  Discharge Instructions    Increase activity slowly    Complete by:  As directed             Medication List    TAKE these medications        allopurinol 300 MG tablet  Commonly known as:  ZYLOPRIM  Take 1 tablet (300 mg total) by mouth daily.     apixaban 5 MG Tabs tablet  Commonly known as:  ELIQUIS  Take 2 tablets (10 mg total) by mouth 2 (two) times daily.     apixaban 5 MG Tabs tablet  Commonly known as:  ELIQUIS  Take 1 tablet (5 mg total) by mouth 2 (two) times daily.  Start taking on:  02/14/2016     colchicine 0.6 MG tablet  Take 1 tablet (0.6 mg total) by mouth 2 (two) times daily.       Allergies  Allergen Reactions  . Penicillins Rash    Has patient had a PCN reaction causing immediate rash, facial/tongue/throat swelling, SOB or lightheadedness with hypotension: Yes Has patient had a PCN reaction causing severe rash involving mucus membranes or skin necrosis: No Has patient had a PCN reaction that required hospitalization No Has patient had a PCN reaction occurring within the last 10 years: No If all of the above answers are "NO", then may proceed with Cephalosporin use.  Follow-up Information    Follow up with Cuevas,FERNANDO, MD On 02/14/2016.   Specialty:  Internal Medicine   Why:  1115 am   Contact information:   [place] [address] [phone]        The results of significant diagnostics from this hospitalization (including imaging, microbiology, ancillary and laboratory) are listed below for reference.    Significant Diagnostic Studies: Ct Angio Chest Pe W/cm &/or Wo Cm  02/05/2016  CLINICAL DATA:  Diagnosed with DVT today. Right leg  swelling for 2 weeks. Mild shortness of breath. EXAM: CT ANGIOGRAPHY CHEST WITH CONTRAST TECHNIQUE: Multidetector CT imaging of the chest was performed using the standard protocol during bolus administration of intravenous contrast. Multiplanar CT image reconstructions and MIPs were obtained to evaluate the vascular anatomy. CONTRAST:  153mL OMNIPAQUE IOHEXOL 350 MG/ML SOLN COMPARISON:  None. FINDINGS: Technically adequate study with good opacification of the central and segmental pulmonary arteries. Large filling defect demonstrated in the distal right main pulmonary artery, extending into multiple upper and lower lobe branches. Appearance is consistent with acute pulmonary embolus. RV to LV ratio is less than 1 suggesting no evidence of right heart strain. Normal heart size. Normal caliber thoracic aorta. No aortic dissection. Great vessel origins are patent. Calcification of the thoracic aorta and coronary arteries. Scattered lymph nodes in mediastinum are not pathologically enlarged and likely reactive. Esophagus is decompressed. Small right pleural effusion with patchy infiltration in the right lower lung possibly representing infarcts. Mild emphysematous changes in the lung apices with scattered peripheral fibrosis. No focal consolidation. No pneumothorax. Airways appear patent. Included portions of the upper abdominal organs demonstrate diffuse fatty infiltration of the liver. Vague focal lesion demonstrated in segment 7 measuring 11 mm diameter. This is of indeterminate etiology cannot be characterized on this study. Degenerative changes throughout the spine. Postoperative changes in the lower cervical spine. No destructive bone lesions. Review of the MIP images confirms the above findings. IMPRESSION: Positive examination for pulmonary embolus with involvement of the distal right main pulmonary artery and multiple upper and lower lobe branches. Small right pleural effusion with patchy infiltration may  indicate pulmonary infarct. No evidence of right heart strain. These results were called by telephone at the time of interpretation on 02/05/2016 at 7:08 pm to Dr. Pattricia Cuevas , who verbally acknowledged these results. Electronically Signed   By: Lucienne Capers M.D.   On: 02/05/2016 19:11   US Venous Img Lower Unilateral Right  02/05/2016  CLINICAL DATA:  Right lower extremity pain and edema for the past 2 weeks. History of smoking and varicose veins. Evaluate for DVT. EXAM: RIGHT LOWER EXTREMITY VENOUS DOPPLER ULTRASOUND TECHNIQUE: Gray-scale sonography with graded compression, as well as color Doppler and duplex ultrasound were performed to evaluate the lower extremity deep venous systems from the level of the common femoral vein and including the common femoral, femoral, profunda femoral, popliteal and calf veins including the posterior tibial, peroneal and gastrocnemius veins when visible. The superficial great saphenous vein was also interrogated. Spectral Doppler was utilized to evaluate flow at rest and with distal augmentation maneuvers in the common femoral, femoral and popliteal veins. COMPARISON:  None. FINDINGS: There is hypoechoic occlusive thrombus extending from the level of the right common femoral vein throughout the entirety of the imaged portions of the right lower extremity venous system, including the saphenofemoral junction and imaged portions of the right greater saphenous vein. The contralateral left common femoral vein appears patent were imaged. Other Findings:  None. IMPRESSION: The examination is positive for extensive occlusive right lower extremity DVT extending from the right common femoral vein to involve all imaged venous structures of the right lower extremity. These results will be called to the ordering clinician or representative by the Radiologist Assistant, and communication documented in the PACS or zVision Dashboard. Electronically Signed   By: Sandi Mariscal M.D.   On:  02/05/2016 15:22    Microbiology: No results found for this or any previous visit (from the past 240 hour(s)).   Labs: Basic Metabolic Panel:  Recent Labs Lab 02/05/16 1720 02/06/16 0351  NA 142 141  K 3.9 4.2  CL 104 106  CO2 30 29  GLUCOSE 95 98  BUN 11 10  CREATININE 0.91 0.84  CALCIUM 9.0 8.8*   Liver Function Tests:  Recent Labs Lab 02/06/16 0351  AST 10*  ALT 9*  ALKPHOS 71  BILITOT 0.8  PROT 5.9*  ALBUMIN 3.3*   No results for input(s): LIPASE, AMYLASE in the last 168 hours. No results for input(s): AMMONIA in the last 168 hours. CBC:  Recent Labs Lab 02/05/16 1720 02/06/16 0351  WBC 8.7 7.0  NEUTROABS 5.5  --   HGB 15.4 14.5  HCT 45.2 42.4  MCV 105.9* 105.2*  PLT 317 292   Cardiac Enzymes: No results for input(s): CKTOTAL, CKMB, CKMBINDEX, TROPONINI in the last 168 hours. BNP: BNP (last 3 results) No results for input(s): BNP in the last 8760 hours.  ProBNP (last 3 results) No results for input(s): PROBNP in the last 8760 hours.  CBG: No results for input(s): GLUCAP in the last 168 hours.     SignedLelon Frohlich  [facility] Pager: [phone] 02/07/2016, 2:26 PM

## 2016-02-07 NOTE — Progress Notes (Signed)
ANTICOAGULATION CONSULT NOTE - Initial Consult  Pharmacy Consult for lovenox and coumadin Indication: pulmonary embolus  Allergies  Allergen Reactions  . Penicillins Rash    Has patient had a PCN reaction causing immediate rash, facial/tongue/throat swelling, SOB or lightheadedness with hypotension: Yes Has patient had a PCN reaction causing severe rash involving mucus membranes or skin necrosis: No Has patient had a PCN reaction that required hospitalization No Has patient had a PCN reaction occurring within the last 10 years: No If all of the above answers are "NO", then may proceed with Cephalosporin use.     Patient Measurements: Height: 6' (182.9 cm) Weight: 179 lb 6.4 oz (81.375 kg) IBW/kg (Calculated) : 77.6  Vital Signs: Temp: 97.9 F (36.6 C) (03/22 0300) Temp Source: Oral (03/22 0300) BP: 165/72 mmHg (03/22 0300) Pulse Rate: 60 (03/22 0300)  Labs:  Recent Labs  02/05/16 1720 02/06/16 0351 02/06/16 1812 02/07/16 0614  HGB 15.4 14.5  --   --   HCT 45.2 42.4  --   --   PLT 317 292  --   --   LABPROT 14.3  --   --  14.5  INR 1.09  --   --  1.11  HEPARINUNFRC  --  <0.10* 0.24*  --   CREATININE 0.91 0.84  --   --     Estimated Creatinine Clearance: 97.5 mL/min (by C-G formula based on Cr of 0.84).   Medical History: Past Medical History  Diagnosis Date  . DVT (deep venous thrombosis) ([place]) 02/05/2016  . Hypertension   . Hypercholesteremia   . COPD (chronic obstructive pulmonary disease) ([place])   . Scoliosis   . PE (pulmonary embolism) 02/05/2016    Medications:  See med rec  Assessment: 65 yo male who presented to the ED with right leg swelling and SOB. US of the leg revealed RLE DVT. CT angiogram show pulmonary embolus. Patient initially started on heparin now transitioning to lovenox and coumadin. INR 1.11  Goal of Therapy:  INR 2-3 Monitor platelets by anticoagulation protocol: Yes   Plan:  Continue lovenox 80mg  (1MG /KG)sq every 12  hours Coumadin 7.5mg  x 1 today PT/INR daily Monitor for S/S of bleeding  [name], BS [name], BCPS Clinical Pharmacist Pager [phone] 02/07/2016,10:08 AM

## 2016-02-07 NOTE — Progress Notes (Signed)
ANTICOAGULATION CONSULT NOTE - Initial Consult  Pharmacy Consult for eliquis Indication: pulmonary embolus  Allergies  Allergen Reactions  . Penicillins Rash    Has patient had a PCN reaction causing immediate rash, facial/tongue/throat swelling, SOB or lightheadedness with hypotension: Yes Has patient had a PCN reaction causing severe rash involving mucus membranes or skin necrosis: No Has patient had a PCN reaction that required hospitalization No Has patient had a PCN reaction occurring within the last 10 years: No If all of the above answers are "NO", then may proceed with Cephalosporin use.     Patient Measurements: Height: 6' (182.9 cm) Weight: 179 lb 6.4 oz (81.375 kg) IBW/kg (Calculated) : 77.6  Vital Signs: Temp: 98 F (36.7 C) (03/22 1251) Temp Source: Oral (03/22 1251) BP: 154/65 mmHg (03/22 1251) Pulse Rate: 61 (03/22 1251)  Labs:  Recent Labs  02/05/16 1720 02/06/16 0351 02/06/16 1812 02/07/16 0614  HGB 15.4 14.5  --   --   HCT 45.2 42.4  --   --   PLT 317 292  --   --   LABPROT 14.3  --   --  14.5  INR 1.09  --   --  1.11  HEPARINUNFRC  --  <0.10* 0.24*  --   CREATININE 0.91 0.84  --   --     Estimated Creatinine Clearance: 97.5 mL/min (by C-G formula based on Cr of 0.84).   Medical History: Past Medical History  Diagnosis Date  . DVT (deep venous thrombosis) ([place]) 02/05/2016  . Hypertension   . Hypercholesteremia   . COPD (chronic obstructive pulmonary disease) ([place])   . Scoliosis   . PE (pulmonary embolism) 02/05/2016    Medications:  See med rec  Assessment: 65 yo male who presented to the ED with right leg swelling and SOB. US of the leg revealed RLE DVT. CT angiogram show pulmonary embolus. Patient initially started on heparin, then lovenox and coumadin, now transitioning to eliquis.  Goal of Therapy:  Monitor platelets by anticoagulation protocol: Yes   Plan:  Eliquis 10mg  po bid for 7 days, then 5mg  po bid Monitor for S/S of  bleeding  [name], BS [name], BCPS Clinical Pharmacist Pager [phone] 02/07/2016,2:13 PM

## 2016-02-07 NOTE — Plan of Care (Cosign Needed)
Problem: Consults Goal: Diagnosis - Venous Thromboembolism (VTE) Choose a selection Outcome: Progressing DVT (Deep Vein Thrombosis)     

## 2016-02-07 NOTE — Plan of Care (Signed)
Problem: Consults Goal: Diagnosis - Venous Thromboembolism (VTE) Choose a selection Outcome: Progressing DVT (Deep Vein Thrombosis)     

## 2016-02-14 DIAGNOSIS — Z720 Tobacco use: Secondary | ICD-10-CM | POA: Diagnosis not present

## 2016-02-14 DIAGNOSIS — Z86711 Personal history of pulmonary embolism: Secondary | ICD-10-CM | POA: Diagnosis not present

## 2016-02-14 DIAGNOSIS — I82511 Chronic embolism and thrombosis of right femoral vein: Secondary | ICD-10-CM | POA: Diagnosis not present

## 2016-02-14 DIAGNOSIS — I10 Essential (primary) hypertension: Secondary | ICD-10-CM | POA: Diagnosis not present

## 2016-02-14 DIAGNOSIS — T2045XA Corrosion of unspecified degree of scalp [any part], initial encounter: Secondary | ICD-10-CM | POA: Diagnosis not present

## 2016-03-15 DIAGNOSIS — T2045XA Corrosion of unspecified degree of scalp [any part], initial encounter: Secondary | ICD-10-CM | POA: Diagnosis not present

## 2016-03-15 DIAGNOSIS — I82511 Chronic embolism and thrombosis of right femoral vein: Secondary | ICD-10-CM | POA: Diagnosis not present

## 2016-03-15 DIAGNOSIS — Z720 Tobacco use: Secondary | ICD-10-CM | POA: Diagnosis not present

## 2016-03-15 DIAGNOSIS — Z86711 Personal history of pulmonary embolism: Secondary | ICD-10-CM | POA: Diagnosis not present

## 2016-03-15 DIAGNOSIS — I10 Essential (primary) hypertension: Secondary | ICD-10-CM | POA: Diagnosis not present

## 2016-03-22 DIAGNOSIS — I82511 Chronic embolism and thrombosis of right femoral vein: Secondary | ICD-10-CM | POA: Diagnosis not present

## 2016-03-22 DIAGNOSIS — I10 Essential (primary) hypertension: Secondary | ICD-10-CM | POA: Diagnosis not present

## 2016-03-22 DIAGNOSIS — Z79899 Other long term (current) drug therapy: Secondary | ICD-10-CM | POA: Diagnosis not present

## 2016-03-22 DIAGNOSIS — R946 Abnormal results of thyroid function studies: Secondary | ICD-10-CM | POA: Diagnosis not present

## 2016-03-22 DIAGNOSIS — R6889 Other general symptoms and signs: Secondary | ICD-10-CM | POA: Diagnosis not present

## 2016-03-22 DIAGNOSIS — Z13220 Encounter for screening for lipoid disorders: Secondary | ICD-10-CM | POA: Diagnosis not present

## 2016-03-22 DIAGNOSIS — E782 Mixed hyperlipidemia: Secondary | ICD-10-CM | POA: Diagnosis not present

## 2016-03-22 DIAGNOSIS — E8881 Metabolic syndrome: Secondary | ICD-10-CM | POA: Diagnosis not present

## 2016-03-22 DIAGNOSIS — R7309 Other abnormal glucose: Secondary | ICD-10-CM | POA: Diagnosis not present

## 2016-04-26 ENCOUNTER — Other Ambulatory Visit (HOSPITAL_COMMUNITY): Payer: Self-pay | Admitting: Internal Medicine

## 2016-04-26 DIAGNOSIS — D751 Secondary polycythemia: Secondary | ICD-10-CM | POA: Diagnosis not present

## 2016-04-26 DIAGNOSIS — R5383 Other fatigue: Secondary | ICD-10-CM | POA: Diagnosis not present

## 2016-04-26 DIAGNOSIS — F17210 Nicotine dependence, cigarettes, uncomplicated: Secondary | ICD-10-CM | POA: Diagnosis not present

## 2016-04-26 DIAGNOSIS — I10 Essential (primary) hypertension: Secondary | ICD-10-CM | POA: Diagnosis not present

## 2016-04-26 DIAGNOSIS — Z86718 Personal history of other venous thrombosis and embolism: Secondary | ICD-10-CM | POA: Diagnosis not present

## 2016-04-26 DIAGNOSIS — E785 Hyperlipidemia, unspecified: Secondary | ICD-10-CM | POA: Diagnosis not present

## 2016-04-26 DIAGNOSIS — R0602 Shortness of breath: Secondary | ICD-10-CM | POA: Diagnosis not present

## 2016-04-26 DIAGNOSIS — E782 Mixed hyperlipidemia: Secondary | ICD-10-CM

## 2016-04-26 DIAGNOSIS — R42 Dizziness and giddiness: Secondary | ICD-10-CM | POA: Diagnosis not present

## 2016-04-26 DIAGNOSIS — R0989 Other specified symptoms and signs involving the circulatory and respiratory systems: Secondary | ICD-10-CM | POA: Diagnosis not present

## 2016-04-29 ENCOUNTER — Other Ambulatory Visit (HOSPITAL_COMMUNITY): Payer: Self-pay | Admitting: Respiratory Therapy

## 2016-04-29 DIAGNOSIS — R0602 Shortness of breath: Secondary | ICD-10-CM

## 2016-04-29 DIAGNOSIS — R5383 Other fatigue: Secondary | ICD-10-CM

## 2016-05-02 ENCOUNTER — Other Ambulatory Visit (HOSPITAL_COMMUNITY): Payer: Self-pay | Admitting: Internal Medicine

## 2016-05-02 DIAGNOSIS — D751 Secondary polycythemia: Secondary | ICD-10-CM

## 2016-05-02 DIAGNOSIS — Z86718 Personal history of other venous thrombosis and embolism: Secondary | ICD-10-CM

## 2016-05-03 ENCOUNTER — Other Ambulatory Visit: Payer: Self-pay | Admitting: Internal Medicine

## 2016-05-03 ENCOUNTER — Other Ambulatory Visit (HOSPITAL_COMMUNITY): Payer: Self-pay | Admitting: Internal Medicine

## 2016-05-03 DIAGNOSIS — E782 Mixed hyperlipidemia: Secondary | ICD-10-CM

## 2016-05-03 DIAGNOSIS — M2550 Pain in unspecified joint: Secondary | ICD-10-CM

## 2016-05-03 DIAGNOSIS — Z86718 Personal history of other venous thrombosis and embolism: Secondary | ICD-10-CM

## 2016-05-03 DIAGNOSIS — R0689 Other abnormalities of breathing: Secondary | ICD-10-CM

## 2016-05-03 DIAGNOSIS — R52 Pain, unspecified: Secondary | ICD-10-CM

## 2016-05-03 DIAGNOSIS — R1084 Generalized abdominal pain: Secondary | ICD-10-CM

## 2016-05-03 DIAGNOSIS — D751 Secondary polycythemia: Secondary | ICD-10-CM

## 2016-05-06 DIAGNOSIS — Z86718 Personal history of other venous thrombosis and embolism: Secondary | ICD-10-CM | POA: Diagnosis not present

## 2016-05-08 ENCOUNTER — Ambulatory Visit (HOSPITAL_COMMUNITY)
Admission: RE | Admit: 2016-05-08 | Discharge: 2016-05-08 | Disposition: A | Discharge status: Home / Self Care | Payer: Medicare Other | Source: Ambulatory Visit | Attending: Internal Medicine | Admitting: Internal Medicine

## 2016-05-08 DIAGNOSIS — R0602 Shortness of breath: Secondary | ICD-10-CM | POA: Diagnosis not present

## 2016-05-08 DIAGNOSIS — R5383 Other fatigue: Secondary | ICD-10-CM | POA: Diagnosis not present

## 2016-05-08 LAB — SPIROMETRY WITH GRAPH
FEF 25-75 Pre: 1.51 L/sec
FEF2575-%Pred-Pre: 51 %
FEV1-%Pred-Pre: 66 %
FEV1-Pre: 2.48 L
FEV1FVC-%Pred-Pre: 93 %
FEV6-%Pred-Pre: 74 %
FEV6-Pre: 3.52 L
FEV6FVC-%Pred-Pre: 105 %
FVC-%Pred-Pre: 71 %
FVC-Pre: 3.53 L
Pre FEV1/FVC ratio: 70 %
Pre FEV6/FVC Ratio: 100 %

## 2016-05-09 ENCOUNTER — Ambulatory Visit (HOSPITAL_COMMUNITY)
Admission: RE | Admit: 2016-05-09 | Discharge: 2016-05-09 | Disposition: A | Discharge status: Home / Self Care | Payer: Medicare Other | Source: Ambulatory Visit | Attending: Internal Medicine | Admitting: Internal Medicine

## 2016-05-09 DIAGNOSIS — I714 Abdominal aortic aneurysm, without rupture: Secondary | ICD-10-CM | POA: Diagnosis not present

## 2016-05-09 DIAGNOSIS — R1084 Generalized abdominal pain: Secondary | ICD-10-CM

## 2016-05-09 DIAGNOSIS — R0689 Other abnormalities of breathing: Secondary | ICD-10-CM

## 2016-05-09 DIAGNOSIS — I2510 Atherosclerotic heart disease of native coronary artery without angina pectoris: Secondary | ICD-10-CM | POA: Insufficient documentation

## 2016-05-09 DIAGNOSIS — I700 Atherosclerosis of aorta: Secondary | ICD-10-CM | POA: Insufficient documentation

## 2016-05-09 DIAGNOSIS — R52 Pain, unspecified: Secondary | ICD-10-CM

## 2016-05-09 LAB — POCT I-STAT CREATININE: Creatinine, Ser: 1.4 mg/dL — ABNORMAL HIGH (ref 0.61–1.24)

## 2016-05-09 MED ORDER — IOPAMIDOL (ISOVUE-300) INJECTION 61%
100.0000 mL | Freq: Once | INTRAVENOUS | Status: AC | PRN
  Administered 2016-05-09: 100 mL via INTRAVENOUS

## 2016-05-13 DIAGNOSIS — I82511 Chronic embolism and thrombosis of right femoral vein: Secondary | ICD-10-CM | POA: Diagnosis not present

## 2016-05-13 DIAGNOSIS — I779 Disorder of arteries and arterioles, unspecified: Secondary | ICD-10-CM | POA: Diagnosis not present

## 2016-05-13 DIAGNOSIS — R0602 Shortness of breath: Secondary | ICD-10-CM | POA: Diagnosis not present

## 2016-05-13 DIAGNOSIS — K7689 Other specified diseases of liver: Secondary | ICD-10-CM | POA: Diagnosis not present

## 2016-05-13 DIAGNOSIS — I2584 Coronary atherosclerosis due to calcified coronary lesion: Secondary | ICD-10-CM | POA: Diagnosis not present

## 2016-05-13 DIAGNOSIS — M10071 Idiopathic gout, right ankle and foot: Secondary | ICD-10-CM | POA: Diagnosis not present

## 2016-05-13 DIAGNOSIS — R791 Abnormal coagulation profile: Secondary | ICD-10-CM | POA: Diagnosis not present

## 2016-05-13 DIAGNOSIS — K5710 Diverticulosis of small intestine without perforation or abscess without bleeding: Secondary | ICD-10-CM | POA: Diagnosis not present

## 2016-05-29 NOTE — Progress Notes (Signed)
Cardiology Office Note   Date:  05/31/2016   ID:  Dwayne Cuevas, DOB 1951-02-18, MRN [id number]  PCP:  Hilbert Corrigan, MD  Cardiologist:   Dorris Carnes, MD    Pt referred for SOB and abnormal CT    History of Present Illness: Jayvin Sedlar is a 65 y.o. male with a history of DVT, PE, HTN, HL, copD.  He was discharged from the [facility] in March 2017.    CT of the chest at that time showed calcifidation of the aorta and coronary arteries   Pt says at times he has trouble breathing   Going up stairs to apt takes it out of him Gets some sharp pains that are transient    L sided  Fleeting    Fri night had coughing spell   Hurt on L  Eased        Outpatient Prescriptions Prior to Visit  Medication Sig Dispense Refill  . allopurinol (ZYLOPRIM) 300 MG tablet Take 1 tablet (300 mg total) by mouth daily. 30 tablet 2  . apixaban (ELIQUIS) 5 MG TABS tablet Take 1 tablet (5 mg total) by mouth 2 (two) times daily. 60 tablet 2  . apixaban (ELIQUIS) 5 MG TABS tablet Take 2 tablets (10 mg total) by mouth 2 (two) times daily. 28 tablet 0  . colchicine 0.6 MG tablet Take 1 tablet (0.6 mg total) by mouth 2 (two) times daily. 60 tablet 2   No facility-administered medications prior to visit.     Allergies:   Penicillins   Past Medical History  Diagnosis Date  . DVT (deep venous thrombosis) ([place]) 02/05/2016  . Hypertension   . Hypercholesteremia   . COPD (chronic obstructive pulmonary disease) ([place])   . Scoliosis   . PE (pulmonary embolism) 02/05/2016    Past Surgical History  Procedure Laterality Date  . Back surgery       Social History:  The patient  reports that he has been smoking Cigarettes.  He has been smoking about 1.00 pack per day. He does not have any smokeless tobacco history on file. He reports that he does not drink alcohol.   Smoking x 51 year   Cut back    Family History:  The patient's family history is not on file. Mother and dad with heart attacks     ROS:   Please see the history of present illness. All other systems are reviewed and  Negative to the above problem except as noted.    PHYSICAL EXAM: VS:  BP 126/70 mmHg  Pulse 72  Ht 6' (1.829 m)  Wt 178 lb (80.74 kg)  BMI 24.14 kg/m2  SpO2 95%   O2 stayed at 98% with walking   GEN: Well nourished, well developed, in no acute distress HEENT: normal Neck: no JVD, carotid bruits, or masses Cardiac: RRR; no murmurs, rubs, or gallops,no edema  Respiratory:  clear to auscultation bilaterally, normal work of breathing GI: soft, nontender, nondistended, + BS  No hepatomegaly  MS: no deformity Moving all extremities   Skin: warm and dry, no rash Neuro:  Strength and sensation are intact Psych: euthymic mood, full affect   EKG:  EKG is not ordered today.n March 2017  SR  RBBB   Lipid Panel No results found for: CHOL, TRIG, HDL, CHOLHDL, VLDL, LDLCALC, LDLDIRECT    Wt Readings from Last 3 Encounters:  05/31/16 178 lb (80.74 kg)  02/06/16 179 lb 6.4 oz (81.375 kg)  07/16/10 191 lb (86.637 kg)  ASSESSMENT AND PLAN:  1  Coronary artery dz.  Found on CT Also mild dilation of aorta   He has DOE  Sats stay the same  Will sched for [employer] Will need f/u of PAD    2  HL  Just started on mevacor  Due to get lipids in about a month  3.  PE  On Eliquis  4.  HTN  Good control     F/U in clinic in  Feb   2018    Signed, Dorris Carnes, MD  05/31/2016 1:54 PM    [facility] [address], [place], [place]  [address] Phone: [phone]; Fax: [phone]

## 2016-05-31 ENCOUNTER — Ambulatory Visit (INDEPENDENT_AMBULATORY_CARE_PROVIDER_SITE_OTHER): Payer: Medicare Other | Admitting: Internal Medicine

## 2016-05-31 ENCOUNTER — Encounter: Payer: Self-pay | Admitting: Internal Medicine

## 2016-05-31 VITALS — BP 126/70 | HR 72 | Ht 72.0 in | Wt 178.0 lb

## 2016-05-31 DIAGNOSIS — R0602 Shortness of breath: Secondary | ICD-10-CM

## 2016-05-31 NOTE — Patient Instructions (Signed)
Your physician wants you to follow-up in: 12/2016   Dr Theressa Stamps will receive a reminder letter in the mail two months in advance. If you don't receive a letter, please call our office to schedule the follow-up appointment.   Your physician recommends that you continue on your current medications as directed. Please refer to the Current Medication list given to you today.     Your physician has requested that you have a lexiscan myoview. For further information please visit [email or web address]. Please follow instruction sheet, as given.      Thank you for choosing [facility] !

## 2016-06-06 ENCOUNTER — Encounter (HOSPITAL_COMMUNITY): Payer: Self-pay

## 2016-06-06 ENCOUNTER — Inpatient Hospital Stay (HOSPITAL_COMMUNITY): Admission: RE | Admit: 2016-06-06 | Payer: Medicare Other | Source: Ambulatory Visit

## 2016-06-06 ENCOUNTER — Encounter (HOSPITAL_COMMUNITY)
Admission: RE | Admit: 2016-06-06 | Discharge: 2016-06-06 | Disposition: A | Discharge status: Home / Self Care | Payer: Medicare Other | Source: Ambulatory Visit | Attending: Internal Medicine | Admitting: Internal Medicine

## 2016-06-06 DIAGNOSIS — R0602 Shortness of breath: Secondary | ICD-10-CM | POA: Diagnosis not present

## 2016-06-06 DIAGNOSIS — I510 Cardiac septal defect, acquired: Secondary | ICD-10-CM | POA: Insufficient documentation

## 2016-06-06 LAB — NM MYOCAR MULTI W/SPECT W/WALL MOTION / EF
LV dias vol: 106 mL (ref 62–150)
LV sys vol: 32 mL
Peak HR: 79 {beats}/min
Percent HR: 50 %
RATE: 0.33
Rest HR: 54 {beats}/min
SDS: 3
SRS: 3
SSS: 6
TID: 0.97

## 2016-06-06 MED ORDER — TECHNETIUM TC 99M TETROFOSMIN IV KIT
30.0000 | PACK | Freq: Once | INTRAVENOUS | Status: AC | PRN
  Administered 2016-06-06: 33 via INTRAVENOUS

## 2016-06-06 MED ORDER — TECHNETIUM TC 99M TETROFOSMIN IV KIT
10.0000 | PACK | Freq: Once | INTRAVENOUS | Status: AC | PRN
  Administered 2016-06-06: 10.4 via INTRAVENOUS

## 2016-06-06 MED ORDER — SODIUM CHLORIDE 0.9% FLUSH
INTRAVENOUS | Status: AC
  Filled 2016-06-06: qty 160

## 2016-06-06 MED ORDER — REGADENOSON 0.4 MG/5ML IV SOLN
INTRAVENOUS | Status: AC
  Administered 2016-06-06: 0.4 mg
  Filled 2016-06-06: qty 5

## 2016-06-07 ENCOUNTER — Other Ambulatory Visit: Payer: Self-pay

## 2016-06-07 DIAGNOSIS — Z86711 Personal history of pulmonary embolism: Secondary | ICD-10-CM

## 2016-06-11 ENCOUNTER — Ambulatory Visit (HOSPITAL_COMMUNITY)
Admission: RE | Admit: 2016-06-11 | Discharge: 2016-06-11 | Disposition: A | Discharge status: Home / Self Care | Payer: Medicare Other | Source: Ambulatory Visit | Attending: Internal Medicine | Admitting: Internal Medicine

## 2016-06-11 DIAGNOSIS — I351 Nonrheumatic aortic (valve) insufficiency: Secondary | ICD-10-CM | POA: Insufficient documentation

## 2016-06-11 DIAGNOSIS — Z86711 Personal history of pulmonary embolism: Secondary | ICD-10-CM | POA: Insufficient documentation

## 2016-06-11 NOTE — Progress Notes (Signed)
*  PRELIMINARY RESULTS* Echocardiogram 2D Echocardiogram has been performed.  Samuel Germany 06/11/2016, 9:03 AM

## 2016-06-13 ENCOUNTER — Telehealth: Payer: Self-pay

## 2016-06-13 DIAGNOSIS — R0602 Shortness of breath: Secondary | ICD-10-CM

## 2016-06-13 NOTE — Telephone Encounter (Signed)
-----   Message from Fay Records, MD sent at 06/12/2016 11:52 AM EDT ----- Pumping function of the heart is normal (both R and L sides)  Echo does not appear to show signs of pulmonary hypertension So far, stress test and echo do not show a heart cause for SOB Would set up for lung function tests (PFTs)

## 2016-06-13 NOTE — Telephone Encounter (Signed)
Pt notified of echo results,pft's ordered

## 2016-06-21 ENCOUNTER — Ambulatory Visit (HOSPITAL_COMMUNITY)
Admission: RE | Admit: 2016-06-21 | Discharge: 2016-06-21 | Disposition: A | Discharge status: Home / Self Care | Payer: Medicare Other | Source: Ambulatory Visit | Attending: Internal Medicine | Admitting: Internal Medicine

## 2016-06-21 DIAGNOSIS — R0602 Shortness of breath: Secondary | ICD-10-CM | POA: Insufficient documentation

## 2016-06-21 DIAGNOSIS — J988 Other specified respiratory disorders: Secondary | ICD-10-CM | POA: Insufficient documentation

## 2016-06-21 LAB — PULMONARY FUNCTION TEST
DL/VA % pred: 71 %
DL/VA: 3.39 ml/min/mmHg/L
DLCO unc % pred: 55 %
DLCO unc: 19.36 ml/min/mmHg
FEF 25-75 Post: 1.91 L/sec
FEF 25-75 Pre: 1.57 L/sec
FEF2575-%Change-Post: 21 %
FEF2575-%Pred-Post: 65 %
FEF2575-%Pred-Pre: 53 %
FEV1-%Change-Post: 5 %
FEV1-%Pred-Post: 66 %
FEV1-%Pred-Pre: 63 %
FEV1-Post: 2.48 L
FEV1-Pre: 2.36 L
FEV1FVC-%Change-Post: -2 %
FEV1FVC-%Pred-Pre: 93 %
FEV6-%Change-Post: 8 %
FEV6-%Pred-Post: 75 %
FEV6-%Pred-Pre: 70 %
FEV6-Post: 3.57 L
FEV6-Pre: 3.31 L
FEV6FVC-%Change-Post: 0 %
FEV6FVC-%Pred-Post: 104 %
FEV6FVC-%Pred-Pre: 103 %
FVC-%Change-Post: 7 %
FVC-%Pred-Post: 72 %
FVC-%Pred-Pre: 67 %
FVC-Post: 3.6 L
FVC-Pre: 3.36 L
Post FEV1/FVC ratio: 69 %
Post FEV6/FVC ratio: 99 %
Pre FEV1/FVC ratio: 70 %
Pre FEV6/FVC Ratio: 98 %
RV % pred: 174 %
RV: 4.27 L
TLC % pred: 99 %
TLC: 7.41 L

## 2016-06-21 MED ORDER — ALBUTEROL SULFATE (2.5 MG/3ML) 0.083% IN NEBU
2.5000 mg | INHALATION_SOLUTION | Freq: Once | RESPIRATORY_TRACT | Status: AC
Start: 2016-06-21 — End: 2016-06-21
  Administered 2016-06-21: 2.5 mg via RESPIRATORY_TRACT

## 2016-07-01 ENCOUNTER — Telehealth: Payer: Self-pay

## 2016-07-01 DIAGNOSIS — J449 Chronic obstructive pulmonary disease, unspecified: Secondary | ICD-10-CM

## 2016-07-01 NOTE — Telephone Encounter (Signed)
Ref placed to dr Dineen Kid spoke w pt

## 2016-07-12 ENCOUNTER — Encounter (INDEPENDENT_AMBULATORY_CARE_PROVIDER_SITE_OTHER): Payer: Self-pay | Admitting: Internal Medicine

## 2016-07-15 DIAGNOSIS — Z86718 Personal history of other venous thrombosis and embolism: Secondary | ICD-10-CM | POA: Diagnosis not present

## 2016-07-15 DIAGNOSIS — E782 Mixed hyperlipidemia: Secondary | ICD-10-CM | POA: Diagnosis not present

## 2016-07-15 DIAGNOSIS — F17210 Nicotine dependence, cigarettes, uncomplicated: Secondary | ICD-10-CM | POA: Diagnosis not present

## 2016-07-30 ENCOUNTER — Ambulatory Visit (INDEPENDENT_AMBULATORY_CARE_PROVIDER_SITE_OTHER): Payer: Medicare Other | Admitting: Internal Medicine

## 2016-07-30 ENCOUNTER — Telehealth (INDEPENDENT_AMBULATORY_CARE_PROVIDER_SITE_OTHER): Payer: Self-pay | Admitting: *Deleted

## 2016-07-30 ENCOUNTER — Encounter (INDEPENDENT_AMBULATORY_CARE_PROVIDER_SITE_OTHER): Payer: Self-pay | Admitting: Internal Medicine

## 2016-07-30 ENCOUNTER — Encounter (INDEPENDENT_AMBULATORY_CARE_PROVIDER_SITE_OTHER): Payer: Self-pay | Admitting: *Deleted

## 2016-07-30 ENCOUNTER — Other Ambulatory Visit (INDEPENDENT_AMBULATORY_CARE_PROVIDER_SITE_OTHER): Payer: Self-pay | Admitting: Internal Medicine

## 2016-07-30 VITALS — BP 150/60 | HR 72 | Temp 98.0°F | Ht 72.0 in | Wt 183.0 lb

## 2016-07-30 DIAGNOSIS — Z86010 Personal history of colonic polyps: Secondary | ICD-10-CM | POA: Diagnosis not present

## 2016-07-30 MED ORDER — PEG 3350-KCL-NA BICARB-NACL 420 G PO SOLR: 4000.0000 mL | Freq: Once | ORAL | 0 refills | Status: AC

## 2016-07-30 NOTE — Telephone Encounter (Signed)
Patient needs trilyte 

## 2016-07-30 NOTE — Patient Instructions (Signed)
The risks and benefits such as perforation, bleeding, and infection were reviewed with the patient and is agreeable. 

## 2016-07-30 NOTE — Progress Notes (Signed)
Subjective:    Patient ID: Dwayne Cuevas, male    DOB: 14-Aug-1951, 65 y.o.   MRN: [id number]  HPI Referred by Dr. Jani Gravel for diverticulitis. He tells me in March he had gout and his leg was swollen. He had a DVT and was admitted. He underwent a CT and it showed PE x 2. In June he underwent a CT for abdominal pain. See below. He was treated by diet only.  His last colonoscopy was in 2010. Biopsy revealed Tubular adenoma. Appetite is good. No weight loss. Usually has a BM daily. No melena or BRRB. No change in his BMs.     Last colonoscopy in 2010 by Dr. Laural Golden. Four small removed . A 8x 12 mm sessile polyp snared piece meal from splenic flexure. A 61mm polyp snared from splenic flexure.  Biopsy: Tubular adenoma.  One was hyperplastic.  Hx of DVT,PE,  Hypertension, COPD.        05/09/2016 CT ABDOMEN AND PELVIS WITH CONTRAST IMPRESSION: 1. Infrarenal abdominal aortic aneurysm of 3.3 cm with considerable plaque formation. Diffuse changes of aortic atherosclerosis. Recommend followup by ultrasound in 3 years. This recommendation follows ACR consensus guidelines: White Paper of the ACR Incidental Findings Committee II on Vascular Findings. J Am Coll Radiol 2013; [id number] 2. Poor opacification of the right common iliac artery which is heavily calcified as well. Cannot exclude arterial occlusion. Correlate clinically. 3. Diffuse coronary artery calcifications. 4. Question small bilateral nonobstructing renal calculi or possibly renovascular calcifications. 5. Thickened mucosa of the rectosigmoid colon may be due to diverticulosis but mild mucosal edema cannot be excluded. 6. The appendix and terminal ileum are unremarkable.           Review of Systems Past Medical History:  Diagnosis Date  . COPD (chronic obstructive pulmonary disease) ([place])   . DVT (deep venous thrombosis) ([place]) 02/05/2016  . Gout   . Hypercholesteremia   . Hypertension   . PE (pulmonary embolism)  02/05/2016  . Scoliosis     Past Surgical History:  Procedure Laterality Date  . BACK SURGERY      Allergies  Allergen Reactions  . Penicillins Rash    Has patient had a PCN reaction causing immediate rash, facial/tongue/throat swelling, SOB or lightheadedness with hypotension: Yes Has patient had a PCN reaction causing severe rash involving mucus membranes or skin necrosis: No Has patient had a PCN reaction that required hospitalization No Has patient had a PCN reaction occurring within the last 10 years: No If all of the above answers are "NO", then may proceed with Cephalosporin use.     Current Outpatient Prescriptions on File Prior to Visit  Medication Sig Dispense Refill  . allopurinol (ZYLOPRIM) 300 MG tablet Take 1 tablet (300 mg total) by mouth daily. 30 tablet 2  . apixaban (ELIQUIS) 5 MG TABS tablet Take 1 tablet (5 mg total) by mouth 2 (two) times daily. 60 tablet 2  . Cod Liver Oil CAPS Take 1 capsule by mouth at bedtime.    . enalapril-hydrochlorothiazide (VASERETIC) 10-25 MG tablet Take 1 tablet by mouth daily.    [name] Kitchen ibuprofen (ADVIL,MOTRIN) 800 MG tablet Take 800 mg by mouth 2 (two) times daily.    [name] Kitchen lovastatin (MEVACOR) 40 MG tablet Take 40 mg by mouth at bedtime.     No current facility-administered medications on file prior to visit.        Objective:   Physical Exam Blood pressure (!) 150/60, pulse 72, temperature 98 F (36.7 C),  height 6' (1.829 m), weight 183 lb (83 kg). Alert and oriented. Skin warm and dry. Oral mucosa is moist.   . Sclera anicteric, conjunctivae is pink. Thyroid not enlarged. No cervical lymphadenopathy. Lungs clear. Heart regular rate and rhythm.  Abdomen is soft. Bowel sounds are positive. No hepatomegaly. No abdominal masses felt. No tenderness.  No edema to lower extremities.          Assessment & Plan:  Hx of colon polyps. Needs surveillance colonoscopy. Recent hx of diverticulitis. The risks and benefits such as perforation,  bleeding, and infection were reviewed with the patient and is agreeable.

## 2016-08-01 ENCOUNTER — Telehealth (INDEPENDENT_AMBULATORY_CARE_PROVIDER_SITE_OTHER): Payer: Self-pay | Admitting: *Deleted

## 2016-08-01 NOTE — Telephone Encounter (Addendum)
Per Estill Bamberg w/ Dr Maudie Mercury it is ok for patient to hold Eliquis 2 days before TCS on 10/03/16

## 2016-08-02 DIAGNOSIS — I10 Essential (primary) hypertension: Secondary | ICD-10-CM | POA: Diagnosis not present

## 2016-08-02 DIAGNOSIS — F17200 Nicotine dependence, unspecified, uncomplicated: Secondary | ICD-10-CM | POA: Diagnosis not present

## 2016-08-02 DIAGNOSIS — J449 Chronic obstructive pulmonary disease, unspecified: Secondary | ICD-10-CM | POA: Diagnosis not present

## 2016-10-01 DIAGNOSIS — K7689 Other specified diseases of liver: Secondary | ICD-10-CM | POA: Diagnosis not present

## 2016-10-01 DIAGNOSIS — I2584 Coronary atherosclerosis due to calcified coronary lesion: Secondary | ICD-10-CM | POA: Diagnosis not present

## 2016-10-01 DIAGNOSIS — E872 Acidosis: Secondary | ICD-10-CM | POA: Diagnosis not present

## 2016-10-01 DIAGNOSIS — F17210 Nicotine dependence, cigarettes, uncomplicated: Secondary | ICD-10-CM | POA: Diagnosis not present

## 2016-10-01 DIAGNOSIS — I779 Disorder of arteries and arterioles, unspecified: Secondary | ICD-10-CM | POA: Diagnosis not present

## 2016-10-01 DIAGNOSIS — Z86718 Personal history of other venous thrombosis and embolism: Secondary | ICD-10-CM | POA: Diagnosis not present

## 2016-10-03 ENCOUNTER — Ambulatory Visit (HOSPITAL_COMMUNITY): Admit: 2016-10-03 | Payer: Medicare Other | Admitting: Internal Medicine

## 2016-10-03 ENCOUNTER — Encounter (HOSPITAL_COMMUNITY): Payer: Self-pay

## 2016-10-03 SURGERY — COLONOSCOPY
Anesthesia: Moderate Sedation

## 2016-10-04 DIAGNOSIS — J449 Chronic obstructive pulmonary disease, unspecified: Secondary | ICD-10-CM | POA: Diagnosis not present

## 2016-10-04 DIAGNOSIS — M545 Low back pain: Secondary | ICD-10-CM | POA: Diagnosis not present

## 2016-10-04 DIAGNOSIS — Z23 Encounter for immunization: Secondary | ICD-10-CM | POA: Diagnosis not present

## 2016-10-04 DIAGNOSIS — E782 Mixed hyperlipidemia: Secondary | ICD-10-CM | POA: Diagnosis not present

## 2016-10-04 DIAGNOSIS — I10 Essential (primary) hypertension: Secondary | ICD-10-CM | POA: Diagnosis not present

## 2016-10-07 DIAGNOSIS — I10 Essential (primary) hypertension: Secondary | ICD-10-CM | POA: Diagnosis not present

## 2016-10-07 DIAGNOSIS — J449 Chronic obstructive pulmonary disease, unspecified: Secondary | ICD-10-CM | POA: Diagnosis not present

## 2016-10-07 DIAGNOSIS — R938 Abnormal findings on diagnostic imaging of other specified body structures: Secondary | ICD-10-CM | POA: Diagnosis not present

## 2016-10-07 DIAGNOSIS — Z86711 Personal history of pulmonary embolism: Secondary | ICD-10-CM | POA: Diagnosis not present

## 2016-11-04 DIAGNOSIS — R5383 Other fatigue: Secondary | ICD-10-CM | POA: Diagnosis not present

## 2016-11-04 DIAGNOSIS — I10 Essential (primary) hypertension: Secondary | ICD-10-CM | POA: Diagnosis not present

## 2016-11-04 DIAGNOSIS — R0602 Shortness of breath: Secondary | ICD-10-CM | POA: Diagnosis not present

## 2016-12-09 DIAGNOSIS — Z720 Tobacco use: Secondary | ICD-10-CM | POA: Diagnosis not present

## 2016-12-09 DIAGNOSIS — I10 Essential (primary) hypertension: Secondary | ICD-10-CM | POA: Diagnosis not present

## 2017-01-08 DIAGNOSIS — I10 Essential (primary) hypertension: Secondary | ICD-10-CM | POA: Diagnosis not present

## 2017-01-08 DIAGNOSIS — I2510 Atherosclerotic heart disease of native coronary artery without angina pectoris: Secondary | ICD-10-CM | POA: Diagnosis not present

## 2017-01-08 DIAGNOSIS — M109 Gout, unspecified: Secondary | ICD-10-CM | POA: Diagnosis not present

## 2017-01-08 DIAGNOSIS — J449 Chronic obstructive pulmonary disease, unspecified: Secondary | ICD-10-CM | POA: Diagnosis not present

## 2017-03-31 DIAGNOSIS — B379 Candidiasis, unspecified: Secondary | ICD-10-CM | POA: Diagnosis not present

## 2017-03-31 DIAGNOSIS — R6884 Jaw pain: Secondary | ICD-10-CM | POA: Diagnosis not present

## 2017-03-31 DIAGNOSIS — J449 Chronic obstructive pulmonary disease, unspecified: Secondary | ICD-10-CM | POA: Diagnosis not present

## 2017-04-01 DIAGNOSIS — I10 Essential (primary) hypertension: Secondary | ICD-10-CM | POA: Diagnosis not present

## 2017-04-01 DIAGNOSIS — E785 Hyperlipidemia, unspecified: Secondary | ICD-10-CM | POA: Diagnosis not present

## 2017-04-01 DIAGNOSIS — I2510 Atherosclerotic heart disease of native coronary artery without angina pectoris: Secondary | ICD-10-CM | POA: Diagnosis not present

## 2017-04-01 DIAGNOSIS — M109 Gout, unspecified: Secondary | ICD-10-CM | POA: Diagnosis not present

## 2017-04-01 DIAGNOSIS — J449 Chronic obstructive pulmonary disease, unspecified: Secondary | ICD-10-CM | POA: Diagnosis not present

## 2017-04-08 DIAGNOSIS — I120 Hypertensive chronic kidney disease with stage 5 chronic kidney disease or end stage renal disease: Secondary | ICD-10-CM | POA: Diagnosis not present

## 2017-04-08 DIAGNOSIS — J449 Chronic obstructive pulmonary disease, unspecified: Secondary | ICD-10-CM | POA: Diagnosis not present

## 2017-04-08 DIAGNOSIS — Z86711 Personal history of pulmonary embolism: Secondary | ICD-10-CM | POA: Diagnosis not present

## 2017-04-08 DIAGNOSIS — I2510 Atherosclerotic heart disease of native coronary artery without angina pectoris: Secondary | ICD-10-CM | POA: Diagnosis not present

## 2017-07-09 ENCOUNTER — Other Ambulatory Visit (HOSPITAL_COMMUNITY): Payer: Self-pay | Admitting: Pulmonary Disease

## 2017-07-09 DIAGNOSIS — I739 Peripheral vascular disease, unspecified: Secondary | ICD-10-CM

## 2017-07-09 DIAGNOSIS — I10 Essential (primary) hypertension: Secondary | ICD-10-CM | POA: Diagnosis not present

## 2017-07-09 DIAGNOSIS — J449 Chronic obstructive pulmonary disease, unspecified: Secondary | ICD-10-CM | POA: Diagnosis not present

## 2017-07-09 DIAGNOSIS — I120 Hypertensive chronic kidney disease with stage 5 chronic kidney disease or end stage renal disease: Secondary | ICD-10-CM | POA: Diagnosis not present

## 2017-07-09 DIAGNOSIS — I2510 Atherosclerotic heart disease of native coronary artery without angina pectoris: Secondary | ICD-10-CM | POA: Diagnosis not present

## 2017-07-11 ENCOUNTER — Other Ambulatory Visit (HOSPITAL_COMMUNITY): Payer: Self-pay | Admitting: Pulmonary Disease

## 2017-07-11 ENCOUNTER — Ambulatory Visit (HOSPITAL_COMMUNITY)
Admission: RE | Admit: 2017-07-11 | Discharge: 2017-07-11 | Disposition: A | Discharge status: Home / Self Care | Payer: Medicare Other | Source: Ambulatory Visit | Attending: Pulmonary Disease | Admitting: Pulmonary Disease

## 2017-07-11 DIAGNOSIS — I739 Peripheral vascular disease, unspecified: Secondary | ICD-10-CM

## 2017-07-11 DIAGNOSIS — I998 Other disorder of circulatory system: Secondary | ICD-10-CM | POA: Diagnosis not present

## 2017-07-11 DIAGNOSIS — R600 Localized edema: Secondary | ICD-10-CM | POA: Diagnosis not present

## 2017-07-11 DIAGNOSIS — I70292 Other atherosclerosis of native arteries of extremities, left leg: Secondary | ICD-10-CM | POA: Insufficient documentation

## 2017-07-11 DIAGNOSIS — I70201 Unspecified atherosclerosis of native arteries of extremities, right leg: Secondary | ICD-10-CM | POA: Insufficient documentation

## 2017-07-31 DIAGNOSIS — Z08 Encounter for follow-up examination after completed treatment for malignant neoplasm: Secondary | ICD-10-CM | POA: Diagnosis not present

## 2017-07-31 DIAGNOSIS — L218 Other seborrheic dermatitis: Secondary | ICD-10-CM | POA: Diagnosis not present

## 2017-07-31 DIAGNOSIS — Z85828 Personal history of other malignant neoplasm of skin: Secondary | ICD-10-CM | POA: Diagnosis not present

## 2017-07-31 DIAGNOSIS — C44319 Basal cell carcinoma of skin of other parts of face: Secondary | ICD-10-CM | POA: Diagnosis not present

## 2017-08-05 ENCOUNTER — Emergency Department (HOSPITAL_COMMUNITY)
Admission: EM | Admit: 2017-08-05 | Discharge: 2017-08-06 | Disposition: A | Discharge status: Home / Self Care | Payer: Medicare Other | Attending: Emergency Medicine | Admitting: Emergency Medicine

## 2017-08-05 ENCOUNTER — Encounter (HOSPITAL_COMMUNITY): Payer: Self-pay | Admitting: Emergency Medicine

## 2017-08-05 DIAGNOSIS — Z7982 Long term (current) use of aspirin: Secondary | ICD-10-CM | POA: Diagnosis not present

## 2017-08-05 DIAGNOSIS — I10 Essential (primary) hypertension: Secondary | ICD-10-CM | POA: Insufficient documentation

## 2017-08-05 DIAGNOSIS — Z79899 Other long term (current) drug therapy: Secondary | ICD-10-CM | POA: Insufficient documentation

## 2017-08-05 DIAGNOSIS — S0180XA Unspecified open wound of other part of head, initial encounter: Secondary | ICD-10-CM | POA: Diagnosis not present

## 2017-08-05 DIAGNOSIS — F17210 Nicotine dependence, cigarettes, uncomplicated: Secondary | ICD-10-CM | POA: Diagnosis not present

## 2017-08-05 DIAGNOSIS — R42 Dizziness and giddiness: Secondary | ICD-10-CM | POA: Diagnosis not present

## 2017-08-05 DIAGNOSIS — L7622 Postprocedural hemorrhage and hematoma of skin and subcutaneous tissue following other procedure: Secondary | ICD-10-CM | POA: Diagnosis not present

## 2017-08-05 DIAGNOSIS — L7621 Postprocedural hemorrhage and hematoma of skin and subcutaneous tissue following a dermatologic procedure: Secondary | ICD-10-CM | POA: Diagnosis not present

## 2017-08-05 DIAGNOSIS — J449 Chronic obstructive pulmonary disease, unspecified: Secondary | ICD-10-CM | POA: Insufficient documentation

## 2017-08-05 DIAGNOSIS — R58 Hemorrhage, not elsewhere classified: Secondary | ICD-10-CM

## 2017-08-05 DIAGNOSIS — D689 Coagulation defect, unspecified: Secondary | ICD-10-CM | POA: Diagnosis present

## 2017-08-05 LAB — CBC WITH DIFFERENTIAL/PLATELET
Basophils Absolute: 0 10*3/uL (ref 0.0–0.1)
Basophils Relative: 0 %
Eosinophils Absolute: 0.1 10*3/uL (ref 0.0–0.7)
Eosinophils Relative: 1 %
HCT: 40.8 % (ref 39.0–52.0)
Hemoglobin: 14.2 g/dL (ref 13.0–17.0)
Lymphocytes Relative: 33 %
Lymphs Abs: 3.1 10*3/uL (ref 0.7–4.0)
MCH: 34.3 pg — ABNORMAL HIGH (ref 26.0–34.0)
MCHC: 34.8 g/dL (ref 30.0–36.0)
MCV: 98.6 fL (ref 78.0–100.0)
Monocytes Absolute: 0.6 10*3/uL (ref 0.1–1.0)
Monocytes Relative: 6 %
Neutro Abs: 5.7 10*3/uL (ref 1.7–7.7)
Neutrophils Relative %: 60 %
Platelets: 214 10*3/uL (ref 150–400)
RBC: 4.14 MIL/uL — ABNORMAL LOW (ref 4.22–5.81)
RDW: 14 % (ref 11.5–15.5)
WBC: 9.5 10*3/uL (ref 4.0–10.5)

## 2017-08-05 MED ORDER — SILVER NITRATE-POT NITRATE 75-25 % EX MISC
CUTANEOUS | Status: AC
  Filled 2017-08-05: qty 1

## 2017-08-05 MED ORDER — SODIUM CHLORIDE 0.9 % IV BOLUS (SEPSIS)
500.0000 mL | Freq: Once | INTRAVENOUS | Status: AC
  Administered 2017-08-05: 500 mL via INTRAVENOUS

## 2017-08-05 MED ORDER — SODIUM CHLORIDE 0.9 % IV BOLUS (SEPSIS)
500.0000 mL | Freq: Once | INTRAVENOUS | Status: AC
  Administered 2017-08-06: 500 mL via INTRAVENOUS

## 2017-08-05 NOTE — Discharge Instructions (Signed)
AVOID ALCOHOL. IT IS OKAY TO START ASPIRIN 81 MG DAILY STARTING TOMORROW.Make sure that you drink at least six 8 ounce glasses of water TOMORROW IN ORDER TO STAY WELL-HYDRATED. Call Dr. Nevada Crane tomorrow to let him know that you were here. He may want to see in the office sooner than your next scheduled appointment in October

## 2017-08-05 NOTE — ED Triage Notes (Signed)
Pt had skin cancer removed Thursday and the area started bleeding tonight.

## 2017-08-05 NOTE — ED Provider Notes (Signed)
[facility] DEPT Provider Note   CSN: [id number] Arrival date & time: 08/05/17  2125     History   Chief Complaint Chief Complaint  Patient presents with  . Coagulation Disorder    HPI Dwayne Cuevas is a 66 y.o. male.  HPI Patient had spontaneous bleeding from a surgical wound at his left temporal areathat was excised 5 days ago. Other associated symptoms include mild lightheadedness worse with standing. No other associated symptoms. Past Medical History:  Diagnosis Date  . COPD (chronic obstructive pulmonary disease) ([place])   . DVT (deep venous thrombosis) ([place]) 02/05/2016  . Gout   . Hypercholesteremia   . Hypertension   . PE (pulmonary embolism) 02/05/2016  . Scoliosis     Patient Active Problem List   Diagnosis Date Noted  . History of colonic polyps 07/30/2016  . DVT (deep venous thrombosis) ([place]) 02/06/2016  . Pulmonary emboli ([place]) 02/05/2016  . CERVICAL SPONDYLOSIS WITHOUT MYELOPATHY 07/16/2010  . TRIGGER FINGER 07/16/2010    Past Surgical History:  Procedure Laterality Date  . BACK SURGERY    . SKIN CANCER EXCISION         Home Medications    Prior to Admission medications   Medication Sig Start Date End Date Taking? Authorizing Provider  allopurinol (ZYLOPRIM) 300 MG tablet Take 1 tablet (300 mg total) by mouth daily. 02/07/16  Yes Isaac Bliss, Rayford Halsted, MD  aspirin EC 81 MG tablet Take 81 mg by mouth daily.   Yes [provider]  co-enzyme Q-10 30 MG capsule Take 30 mg by mouth daily.   Yes [provider]  Cod Liver Oil CAPS Take 1 capsule by mouth at bedtime.   Yes [provider]  enalapril-hydrochlorothiazide (VASERETIC) 10-25 MG tablet Take 1 tablet by mouth daily.   Yes [provider]  ibuprofen (ADVIL,MOTRIN) 800 MG tablet Take 800 mg by mouth daily.    Yes [provider]  lovastatin (MEVACOR) 40 MG tablet Take 40 mg by mouth at bedtime.   Yes [provider]  Vitamin E 100 units  TABS Take 1 tablet by mouth daily.   Yes [provider]  patient has been off of aspirin for 1 week since removal of skin cancer  Family History Family History  Problem Relation Age of Onset  . Heart disease Mother   . Heart disease Father   . Cystic fibrosis Paternal Aunt     Social History Social History  Substance Use Topics  . Smoking status: Current Every Day Smoker    Packs/day: 1.00    Types: Cigarettes  . Smokeless tobacco: Never Used  . Alcohol use Yes  denies illicit drug use   Allergies   Penicillins   Review of Systems Review of Systems  Skin: Positive for wound.  Neurological: Positive for light-headedness.  All other systems reviewed and are negative.    Physical Exam Updated Vital Signs BP (!) 163/75   Pulse 73   Temp 98.5 F (36.9 C)   Resp 20   Ht 6' (1.829 m)   Wt 85.3 kg (188 lb)   SpO2 99%   BMI 25.50 kg/m   Physical Exam  Constitutional: He is oriented to person, place, and time. He appears well-developed and well-nourished.  HENT:  Head: Normocephalic and atraumatic.  There is an approximately 3 cm x 2 cm oval excised surgical wound at left temporal areawith clotted blood No active bleeding presently. Otherwise normocephalic atraumatic  Eyes: Pupils are equal, round, and reactive  to light. Conjunctivae are normal.  Neck: Neck supple. No tracheal deviation present. No thyromegaly present.  Cardiovascular: Normal rate and regular rhythm.   No murmur heard. Pulmonary/Chest: Effort normal and breath sounds normal.  Abdominal: Soft. Bowel sounds are normal. He exhibits no distension. There is no tenderness.  Musculoskeletal: Normal range of motion. He exhibits no edema or tenderness.  Neurological: He is alert and oriented to person, place, and time. Coordination normal.  Gait normal mildly lightheaded on standing  Skin: Skin is warm and dry. No rash noted.  Psychiatric: He has a normal mood and affect.  Nursing note and vitals  reviewed.    ED Treatments / Results  Labs (all labs ordered are listed, but only abnormal results are displayed) Labs Reviewed  CBC WITH DIFFERENTIAL/PLATELET    EKG  EKG Interpretation None       Radiology No results found.  Procedures Procedures (including critical care time)  Medications Ordered in ED Medications  silver nitrate applicators 85-63 % applicator (not administered)  sodium chloride 0.9 % bolus 500 mL (500 mLs Intravenous New Bag/Given 08/05/17 2306)   Results for orders placed or performed during the hospital encounter of 08/05/17  CBC with Differential/Platelet  Result Value Ref Range   WBC 9.5 4.0 - 10.5 K/uL   RBC 4.14 (L) 4.22 - 5.81 MIL/uL   Hemoglobin 14.2 13.0 - 17.0 g/dL   HCT 40.8 39.0 - 52.0 %   MCV 98.6 78.0 - 100.0 fL   MCH 34.3 (H) 26.0 - 34.0 pg   MCHC 34.8 30.0 - 36.0 g/dL   RDW 14.0 11.5 - 15.5 %   Platelets 214 150 - 400 K/uL   Neutrophils Relative % 60 %   Neutro Abs 5.7 1.7 - 7.7 K/uL   Lymphocytes Relative 33 %   Lymphs Abs 3.1 0.7 - 4.0 K/uL   Monocytes Relative 6 %   Monocytes Absolute 0.6 0.1 - 1.0 K/uL   Eosinophils Relative 1 %   Eosinophils Absolute 0.1 0.0 - 0.7 K/uL   Basophils Relative 0 %   Basophils Absolute 0.0 0.0 - 0.1 K/uL   US Arterial Seg Multiple  Result Date: 07/12/2017 CLINICAL DATA:  Constant right foot pain, redness, edema. History of DVT. Diabetes, hypertension, tobacco abuse. EXAM: NONINVASIVE PHYSIOLOGIC VASCULAR STUDY OF BILATERAL LOWER EXTREMITIES TECHNIQUE: Evaluation of both lower extremities was performed at rest, including calculation of ankle-brachial indices, multiple segmental pressure evaluation, segmental Doppler and segmental pulse volume recording. COMPARISON:  None. FINDINGS: Right ABI:  0.36 Left ABI:  0.62 Right Lower Extremity: Segmental pressures suggest severe inflow disease, and tandem infrapopliteal occlusive disease. Toe brachial index 0.42. Pulse volume recording is attenuated  from the low thigh distally. Left Lower Extremity: Segmental pressures suggest both inflow and run off components of occlusive disease. Toe brachial index 0.63. IMPRESSION: 1. 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 Consider CTA runoff (higher spatial resolution) or MRA runoff (no radiation risk, can be performed noncontrast in the setting of renal dysfunction) to better define the site and nature of arterial occlusive disease and delineate treatment options. Electronically Signed   By: Lucrezia Europe M.D.   On: 07/12/2017 09:40    Initial Impression / Assessment and Plan / ED Course  I have reviewed the triage vital signs and the nursing notes.  Pertinent labs & imaging results that were available during my care of the patient were reviewed by me  and considered in my medical decision making (see chart for details).     Wound at left temporal areawas cauterized by me using silver nitrate sticks with good hemostasis. 11:40 PMafter treatment with intravenous normal saline, lightheadedness it is improved and patient feels ready to go home.plan avoid alcohol. He is told that he can start aspirin and tomorrow. Follow up with Dr. Nevada Crane, dermatologist Final Clinical Impressions(s) / ED Diagnoses  Diagnosis postoperative bleeding Final diagnoses:  None    New Prescriptions New Prescriptions   No medications on file     Orlie Dakin, MD 08/05/17 2351

## 2017-08-18 DIAGNOSIS — L980 Pyogenic granuloma: Secondary | ICD-10-CM | POA: Diagnosis not present

## 2017-08-18 DIAGNOSIS — Z85828 Personal history of other malignant neoplasm of skin: Secondary | ICD-10-CM | POA: Diagnosis not present

## 2017-08-18 DIAGNOSIS — Z08 Encounter for follow-up examination after completed treatment for malignant neoplasm: Secondary | ICD-10-CM | POA: Diagnosis not present

## 2017-08-25 DIAGNOSIS — L980 Pyogenic granuloma: Secondary | ICD-10-CM | POA: Diagnosis not present

## 2017-08-27 ENCOUNTER — Other Ambulatory Visit: Payer: Self-pay

## 2017-08-27 DIAGNOSIS — I739 Peripheral vascular disease, unspecified: Secondary | ICD-10-CM

## 2017-08-29 ENCOUNTER — Encounter: Payer: Self-pay | Admitting: Vascular Surgery

## 2017-08-29 ENCOUNTER — Ambulatory Visit (INDEPENDENT_AMBULATORY_CARE_PROVIDER_SITE_OTHER): Payer: Medicare Other | Admitting: Vascular Surgery

## 2017-08-29 ENCOUNTER — Ambulatory Visit (HOSPITAL_COMMUNITY)
Admission: RE | Admit: 2017-08-29 | Discharge: 2017-08-29 | Disposition: A | Discharge status: Home / Self Care | Payer: Medicare Other | Source: Ambulatory Visit | Attending: Vascular Surgery | Admitting: Vascular Surgery

## 2017-08-29 ENCOUNTER — Other Ambulatory Visit: Payer: Self-pay | Admitting: *Deleted

## 2017-08-29 ENCOUNTER — Encounter: Payer: Self-pay | Admitting: *Deleted

## 2017-08-29 VITALS — BP 140/73 | HR 70 | Temp 97.3°F | Resp 20 | Ht 72.0 in | Wt 189.3 lb

## 2017-08-29 DIAGNOSIS — I70229 Atherosclerosis of native arteries of extremities with rest pain, unspecified extremity: Secondary | ICD-10-CM | POA: Insufficient documentation

## 2017-08-29 DIAGNOSIS — I998 Other disorder of circulatory system: Secondary | ICD-10-CM

## 2017-08-29 DIAGNOSIS — I739 Peripheral vascular disease, unspecified: Secondary | ICD-10-CM | POA: Insufficient documentation

## 2017-08-29 LAB — VAS US LOWER EXTREMITY ARTERIAL DUPLEX
LEFT PERO DIST SYS: 30 cm/s
Left ant tibial distal sys: 18 cm/s
Left super femoral dist sys PSV: -46 cm/s
Left super femoral mid sys PSV: -51 cm/s
Left super femoral prox sys PSV: -83 cm/s
RIGHT POST TIB DIST SYS: 11 cm/s
left post tibial dist sys: 30 cm/s

## 2017-08-29 MED ORDER — OXYCODONE-ACETAMINOPHEN 5-325 MG PO TABS: 1.0000 | ORAL_TABLET | Freq: Four times a day (QID) | ORAL | 0 refills | Status: DC | PRN

## 2017-08-29 NOTE — Progress Notes (Signed)
Requested by:  Sinda Du, MD [place],  [id number]  Reason for consultation: PAD   History of Present Illness   Dwayne Cuevas is a 66 y.o. (04-29-1951) male who presents with chief complaint: right leg pain.  Onset of symptoms occurred > 1.5 years ago.  Pain is described as aching at rest, waking him from rest, severity 10/10, and associated with ambulation and rest.  Patient has attempted to treat this pain with NSAID.  The patient has rest pain symptoms.  The patient has no leg wounds/ulcers.    Atherosclerotic risk factors include: HLD, HTN, active smoker.  Past Medical History:  Diagnosis Date  . COPD (chronic obstructive pulmonary disease) ([place])   . DVT (deep venous thrombosis) ([place]) 02/05/2016  . Gout   . Hypercholesteremia   . Hypertension   . PE (pulmonary embolism) 02/05/2016  . Scoliosis     Past Surgical History:  Procedure Laterality Date  . BACK SURGERY    . SKIN CANCER EXCISION      Social History   Social History  . Marital status: Divorced    Spouse name: N/A  . Number of children: N/A  . Years of education: N/A   Occupational History  . Not on file.   Social History Main Topics  . Smoking status: Current Every Day Smoker    Packs/day: 1.00    Types: Cigarettes  . Smokeless tobacco: Never Used  . Alcohol use Yes  . Drug use: Unknown  . Sexual activity: Not on file   Other Topics Concern  . Not on file   Social History Narrative  . No narrative on file    Family History  Problem Relation Age of Onset  . Heart disease Mother   . Heart disease Father   . Cystic fibrosis Paternal Aunt     Current Outpatient Prescriptions  Medication Sig Dispense Refill  . allopurinol (ZYLOPRIM) 300 MG tablet Take 1 tablet (300 mg total) by mouth daily. 30 tablet 2  . aspirin EC 81 MG tablet Take 81 mg by mouth daily.    [name] Kitchen co-enzyme Q-10 30 MG capsule Take 30 mg by mouth daily.    [name] Kitchen Cod Liver Oil CAPS Take 1  capsule by mouth at bedtime.    . enalapril-hydrochlorothiazide (VASERETIC) 10-25 MG tablet Take 1 tablet by mouth daily.    [name] Kitchen ibuprofen (ADVIL,MOTRIN) 800 MG tablet Take 800 mg by mouth daily.     [name] Kitchen lovastatin (MEVACOR) 40 MG tablet Take 40 mg by mouth at bedtime.    . Vitamin E 100 units TABS Take 1 tablet by mouth daily.     No current facility-administered medications for this visit.     Allergies  Allergen Reactions  . Penicillins Rash    Has patient had a PCN reaction causing immediate rash, facial/tongue/throat swelling, SOB or lightheadedness with hypotension: Yes Has patient had a PCN reaction causing severe rash involving mucus membranes or skin necrosis: No Has patient had a PCN reaction that required hospitalization No Has patient had a PCN reaction occurring within the last 10 years: No If all of the above answers are "NO", then may proceed with Cephalosporin use.     REVIEW OF SYSTEMS (negative unless checked):   Cardiac:  []  Chest pain or chest pressure? [x]  Shortness of breath upon activity? []  Shortness of breath when lying flat? []  Irregular heart rhythm?  Vascular:  [x]  Pain in calf, thigh, or hip brought on by  walking? [x]  Pain in feet at night that wakes you up from your sleep? [x]  Blood clot in your veins? [x]  Leg swelling?  Pulmonary:  []  Oxygen at home? []  Productive cough? []  Wheezing?  Neurologic:  []  Sudden weakness in arms or legs? [x]  Sudden numbness in arms or legs? []  Sudden onset of difficult speaking or slurred speech? []  Temporary loss of vision in one eye? [x]  Problems with dizziness?  Gastrointestinal:  []  Blood in stool? []  Vomited blood?  Genitourinary:  []  Burning when urinating? []  Blood in urine?  Psychiatric:  []  Major depression  Hematologic:  []  Bleeding problems? []  Problems with blood clotting?  Dermatologic:  []  Rashes or ulcers?  Constitutional:  []  Fever or chills?  Ear/Nose/Throat:  []  Change in  hearing? []  Nose bleeds? []  Sore throat?  Musculoskeletal:  []  Back pain? []  Joint pain? []  Muscle pain?   For VQI Use Only   PRE-ADM LIVING Home  AMB STATUS Ambulatory  CAD Sx None  PRIOR CHF None  STRESS TEST Normal    Physical Examination     Vitals:   08/29/17 1215 08/29/17 1216  BP: (!) 142/73 140/73  Pulse: 70   Resp: 20   Temp: (!) 97.3 F (36.3 C)   TempSrc: Oral   SpO2: 100%   Weight: 189 lb 4.8 oz (85.9 kg)   Height: 6' (1.829 m)    Body mass index is 25.67 kg/m.  General Alert, O x 3, WD, NAD  [place]/AT,    Ear/Nose/ Throat Hearing grossly intact, nares without erythema or drainage, oropharynx without Erythema or Exudate, Mallampati score: 3,   Eyes PERRLA, EOMI,    Neck Supple, mid-line trachea,    Pulmonary Sym exp, good B air movt, CTA B  Cardiac RRR, Nl S1, S2, no Murmurs, No rubs, No S3,S4  Vascular Vessel Right Left  Radial Palpable Palpable  Brachial Palpable Palpable  Carotid Palpable, No Bruit Palpable, No Bruit  Aorta Not palpable N/A  Femoral Not palpable Faintly palpable  Popliteal Not palpable Not palpable  PT Not palpable Not palpable  DP Not palpable Not palpable    Gastro- intestinal soft, non-distended, non-tender to palpation, No guarding or rebound, no HSM, no masses, no CVAT B, No palpable prominent aortic pulse,    Musculo- skeletal M/S 5/5 throughout  , Extremities without ischemic changes  , Non-pitting edema present: R 1+, Varicosities present: B small, No Lipodermatosclerosis present, B feet cyanotic, no frank ulcers or gangrene  Neurologic Cranial nerves 2-12 intact , Pain and light touch intact in extremities , Motor exam as listed above  Psychiatric Judgement intact, Mood & affect appropriate for pt's clinical situation  Dermatologic See M/S exam for extremity exam, No rashes otherwise noted  Lymphatic  Palpable lymph nodes: None    Non-Invasive Vascular Imaging   Outside BLE Physiologic (07/11/17)  R:    ABI: 0.36,   Severe inflow disease and tandem infrapopliteal occlusive disease  L:   ABI: 0.62,   Inflow and runoff disease suggested  BLE Arterial Duplex (08/29/2017)  R:   Occluded CFA  PFA: 63 c/s  SFA: occluded  Pop: 26 c/s  AT: 11 c/s  PT: 11 c/s  L: patent throughout   Outside Studies/Documentation   6 pages of outside documents were reviewed including: outpatient PCP chart and outside physiologic studies.   Medical Decision Making   Dwayne Cuevas is a 66 y.o. male who presents with: RLE critical limb ischemia, moderate L PAD  I discussed with the patient the natural history of critical limb ischemia: 25% require amputation in one year, 50% are able to maintain their limbs in one year, and 25-30% die in one year due to comorbidities.  Given the limb threatening status of this patient, I recommend an aggressive work up including proceeding with an: Aortogram, Bilateral runoff and possible R leg intervention. I discussed with the patient the nature of angiographic procedures, especially the limited patencies of any endovascular intervention. The patient is aware of that the risks of an angiographic procedure include but are not limited to: bleeding, infection, access site complications, embolization, rupture of treated vessel, dissection, possible need for emergent surgical intervention, and possible need for surgical procedures to treat the patient's pathology. The patient is aware of the risks and agrees to proceed.  The procedure is scheduled for: 18 OCT 18.  I discussed in depth with the patient the nature of atherosclerosis, and emphasized the importance of maximal medical management including strict control of blood pressure, blood glucose, and lipid levels, antiplatelet agents, obtaining regular exercise, and cessation of smoking.  The patient is aware that without maximal medical management the underlying atherosclerotic disease process will progress,  limiting the benefit of any interventions. The patient is currently Mevacor  The patient is currently on an anti-platelet: ASA.  Pt reportedly has had worsening of his Cr recently. I told patient to stop taking Motrin. I gave the patient a prescription for: Percocet 5/325 mg 1 PO q6 hr prn pain #30 no refills to help his rest pain.  Thank you for allowing [place] to participate in this patient's care.   Adele Barthel, MD, FACS [facility] Office: [phone] Pager: [phone]  08/29/2017, 12:40 PM

## 2017-09-01 ENCOUNTER — Telehealth: Payer: Self-pay | Admitting: *Deleted

## 2017-09-01 NOTE — Telephone Encounter (Signed)
   [facility] Pre-operative Risk Assessment    Request for surgical clearance:  1. What type of surgery is being performed?:Cardiac Clearance: Aortogram with bilateral runoff and possible Right leg intervention.  2. When is this surgery scheduled? 10.18.2018   3. Are there any medications that need to be held prior to surgery and how long? None   4. Practice name and name of physician performing surgery? [facility] Adele Barthel, MD   5. What is your office phone and fax number? PH: [phone] FX: [phone]  4. Anesthesia type (None, local, MAC, general) ? None    [place], Dwayne Cuevas 09/01/2017, 5:19 PM  _________________________________________________________________   (provider comments below)

## 2017-09-01 NOTE — Telephone Encounter (Signed)
   Chart reviewed as part of pre-operative protocol coverage. Because of Dwayne Cuevas's past medical history and time since last visit, he/she will require a follow-up visit in order to better assess preoperative cardiovascular risk. He has not been seen since 2017. Please schedule appointment and call patient to inform them. Please call vascular surgery office to make them aware as procedure is scheduled for 09/04/17 for their MDs to review as I do not actually see any mention in recent note for need for cardiology clearance prior to procedure.  Charlie Pitter, PA-C  09/01/2017, 5:36 PM

## 2017-09-02 NOTE — Telephone Encounter (Signed)
Called and spoke to [place], Dr. Lianne Moris nurse, to get clarification as to whether the patient in fact needed cardiac clearance for the aortogram scheduled for 09/04/17, since aortograms typically do not require cardiac clearance. Jacqlyn Larsen states that the patient does not need cardiac clearance for the aortogram scheduled for 09/04/17, but will need cardiac clearance if they decide that the patient requires surgical intervention after the aortogram is complete. Patient is already scheduled to see Lyda Jester, PA on 09/03/17 for surgical clearance if it is needed after the aortogram.

## 2017-09-03 ENCOUNTER — Encounter: Payer: Self-pay | Admitting: Cardiology

## 2017-09-03 ENCOUNTER — Ambulatory Visit (INDEPENDENT_AMBULATORY_CARE_PROVIDER_SITE_OTHER): Payer: Medicare Other | Admitting: Cardiology

## 2017-09-03 VITALS — BP 124/50 | HR 69 | Resp 16 | Ht 72.0 in | Wt 188.0 lb

## 2017-09-03 DIAGNOSIS — Z01810 Encounter for preprocedural cardiovascular examination: Secondary | ICD-10-CM | POA: Diagnosis not present

## 2017-09-03 NOTE — Progress Notes (Signed)
09/03/2017 SRIMAN TALLY   [birth year]  [id number]  Primary Physician Sinda Du, MD Primary Cardiologist: Dr. Harrington Challenger  Reason for Visit/CC: Pre-operative Clearance   HPI:  BRODRICK CURRAN is a 66 y.o. male who is being seen today for pre-operative clearance. He is a smoker and significant  LE PVD w/ critical limb ischemia, followed by Dr. [name]. He is scheduled to undergo abdominal aortogram with LE runoff tomorrow. It is likely that he will need LE arterial bypass.   He has been followed by Dr. Harrington Challenger. He has a h/o COPD as well as PE, diagnosed in March 2017. Chest CT showed incidental findings of coronary artery calcifications as well as calcification of the thoracic aorta, prompting referral to cardiology. At that time, he noted exertional dyspnea walking up stairs but no chest pain. Dr. Harrington Challenger ordered an exercise NST. This was performed 06/06/2016. There were not diagnostic ST segment changes during exercise to indicate ischemia. Nuclear images showed small, mild intensity, inferoseptal defect extending from apex to base, overall fixed. There is a minor degree of partial reversibility towards the apex. Suspect variable soft tissue attenuation versus a minor region of apical inferoseptal ischemia. EF was 70. This was overall felt to be a low risk study. Dr. Harrington Challenger recommended medical therapy. 2D echo on 06/11/17 showed normal LVEF at 60-65% with normal wall motion without regional abnormalities. There was mild AI. The aortic root and ascending aorta were normal in size. He was also noted to have normal RV size and systolic function. After his visit with Dr. Harrington Challenger, he was sent to his pulmonologist, who adjusted his COPD inhalers. PT notes the change in his regimen resulted in significant improvement in breathing and improved exercise tolerance.   Despite his LE claudication, he has still been able to ambulate and perform daily ADLs w/o CP or dyspnea. He lives in a 2nd floor apartment and has to ambulate  up and down the stairs daily. He denies any exertional CP or dyspnea with this activity. He is also able to walk 4 blocks without cardiac symptoms. His EKG shows NSR with chronic RBBB (present on previous EKGs since 2017.   According to the Revised Cardiac Risk Index (RCRI), his Perioperative Risk of Major Cardiac Event is (%): 0.9  His Functional Capacity in METs is: 7.25 according to the Duke Activity Status Index (DASI).     Current Meds  Medication Sig  . allopurinol (ZYLOPRIM) 300 MG tablet Take 1 tablet (300 mg total) by mouth daily. (Patient taking differently: Take 300 mg by mouth every other day. )  . co-enzyme Q-10 30 MG capsule Take 30 mg by mouth daily.  [name] Kitchen Cod Liver Oil CAPS Take 1 capsule by mouth at bedtime.  . enalapril-hydrochlorothiazide (VASERETIC) 10-25 MG tablet Take 1 tablet by mouth daily.  [name] Kitchen lovastatin (MEVACOR) 40 MG tablet Take 40 mg by mouth every other day.   . oxyCODONE-acetaminophen (PERCOCET/ROXICET) 5-325 MG tablet Take 1 tablet by mouth every 6 (six) hours as needed for moderate pain. (Patient taking differently: Take 1 tablet by mouth 2 (two) times daily as needed for moderate pain. )  . Vitamin E 100 units TABS Take 100 Units by mouth daily.    Allergies  Allergen Reactions  . Shellfish Allergy Other (See Comments)    gout  . Penicillins Rash    Has patient had a PCN reaction causing immediate rash, facial/tongue/throat swelling, SOB or lightheadedness with hypotension: Yes Has patient had a PCN reaction causing severe rash  involving mucus membranes or skin necrosis: No Has patient had a PCN reaction that required hospitalization No Has patient had a PCN reaction occurring within the last 10 years: No If all of the above answers are "NO", then may proceed with Cephalosporin use.    Past Medical History:  Diagnosis Date  . COPD (chronic obstructive pulmonary disease) ([place])   . DVT (deep venous thrombosis) ([place]) 02/05/2016  . Gout   .  Hypercholesteremia   . Hypertension   . PE (pulmonary embolism) 02/05/2016  . Scoliosis    Family History  Problem Relation Age of Onset  . Heart disease Mother   . Heart disease Father   . Cystic fibrosis Paternal Aunt    Past Surgical History:  Procedure Laterality Date  . BACK SURGERY    . SKIN CANCER EXCISION     Social History   Social History  . Marital status: Divorced    Spouse name: N/A  . Number of children: N/A  . Years of education: N/A   Occupational History  . Not on file.   Social History Main Topics  . Smoking status: Current Every Day Smoker    Packs/day: 1.00    Types: Cigarettes  . Smokeless tobacco: Never Used  . Alcohol use Yes  . Drug use: Unknown  . Sexual activity: Not on file   Other Topics Concern  . Not on file   Social History Narrative  . No narrative on file     Review of Systems: General: negative for chills, fever, night sweats or weight changes.  Cardiovascular: negative for chest pain, dyspnea on exertion, edema, orthopnea, palpitations, paroxysmal nocturnal dyspnea or shortness of breath Dermatological: negative for rash Respiratory: negative for cough or wheezing Urologic: negative for hematuria Abdominal: negative for nausea, vomiting, diarrhea, bright red blood per rectum, melena, or hematemesis Neurologic: negative for visual changes, syncope, or dizziness All other systems reviewed and are otherwise negative except as noted above.   Physical Exam:  Blood pressure (!) 124/50, pulse 69, resp. rate 16, height 6' (1.829 m), weight 188 lb (85.3 kg), SpO2 98 %.  General appearance: alert, cooperative and no distress Neck: no carotid bruit and no JVD Lungs: clear to auscultation bilaterally Heart: regular rate and rhythm, S1, S2 normal, no murmur, click, rub or gallop Extremities: no LEE Pulses: decreased DPs bilaterally Skin: Skin color, texture, turgor normal. No rashes or lesions Neurologic: Grossly normal  EKG NSR  with chronic RBBB -- personally reviewed   ASSESSMENT AND PLAN:   1. Pre-operative Risk Assessment: Pt with suspected CAD based on presence of coronary calcifications on CT scan and risk factors of tobacco use and PAD. However he had a NST 1 year ago in 2017 that was negative for ischemia or previous infarction. 2D echo 05/2016 showed normal LVEF, wall motion and no valvular abnormalities. He also lacks ischemic symptoms.  His Functional Capacity in METs is: 7.25 according to the Duke Activity Status Index (DASI). He denies any exertional CP or dyspnea with this level of METS. EKG shows NSR with RBBB, unchanged from baseline EKG. Other than decreased DPs bilaterally, his physical exam is benign. According to the Revised Cardiac Risk Index (RCRI), his Perioperative Risk of Major Cardiac Event is (%): 0.9   Based on assessment and ACC guidelines, there is no indication for further cardiac testing prior to surgery. He can be cleared for likely vascular surgery with Dr. [name].    Missey Hasley Ladoris Gene, MHS [facility] 09/03/2017 1:08 PM

## 2017-09-03 NOTE — Patient Instructions (Signed)
Medication Instructions:  Your physician recommends that you continue on your current medications as directed. Please refer to the Current Medication list given to you today.   Labwork: NONE ORDERED TODAY  Testing/Procedures: NONE ORDERED TODAY  Follow-Up: 12/08/17 @ 9:40 WITH DR. ROSS IN THE [address] LOCATION AT [place]  Any Other Special Instructions Will Be Listed Below (If Applicable).     If you need a refill on your cardiac medications before your next appointment, please call your pharmacy.

## 2017-09-04 ENCOUNTER — Ambulatory Visit (HOSPITAL_COMMUNITY)
Admission: RE | Admit: 2017-09-04 | Discharge: 2017-09-04 | Disposition: A | Discharge status: Home / Self Care | Payer: Medicare Other | Source: Ambulatory Visit | Attending: Vascular Surgery | Admitting: Vascular Surgery

## 2017-09-04 ENCOUNTER — Encounter (HOSPITAL_COMMUNITY)
Admission: RE | Disposition: A | Discharge status: Home / Self Care | Payer: Self-pay | Source: Ambulatory Visit | Attending: Vascular Surgery

## 2017-09-04 DIAGNOSIS — Z7982 Long term (current) use of aspirin: Secondary | ICD-10-CM | POA: Insufficient documentation

## 2017-09-04 DIAGNOSIS — I739 Peripheral vascular disease, unspecified: Secondary | ICD-10-CM | POA: Diagnosis not present

## 2017-09-04 DIAGNOSIS — Z79899 Other long term (current) drug therapy: Secondary | ICD-10-CM | POA: Insufficient documentation

## 2017-09-04 DIAGNOSIS — F17210 Nicotine dependence, cigarettes, uncomplicated: Secondary | ICD-10-CM | POA: Insufficient documentation

## 2017-09-04 DIAGNOSIS — J449 Chronic obstructive pulmonary disease, unspecified: Secondary | ICD-10-CM | POA: Diagnosis not present

## 2017-09-04 DIAGNOSIS — I70223 Atherosclerosis of native arteries of extremities with rest pain, bilateral legs: Secondary | ICD-10-CM | POA: Diagnosis not present

## 2017-09-04 DIAGNOSIS — I10 Essential (primary) hypertension: Secondary | ICD-10-CM | POA: Insufficient documentation

## 2017-09-04 DIAGNOSIS — Z86718 Personal history of other venous thrombosis and embolism: Secondary | ICD-10-CM | POA: Diagnosis not present

## 2017-09-04 DIAGNOSIS — M109 Gout, unspecified: Secondary | ICD-10-CM | POA: Diagnosis not present

## 2017-09-04 DIAGNOSIS — I998 Other disorder of circulatory system: Secondary | ICD-10-CM | POA: Diagnosis present

## 2017-09-04 DIAGNOSIS — Z86711 Personal history of pulmonary embolism: Secondary | ICD-10-CM | POA: Diagnosis not present

## 2017-09-04 DIAGNOSIS — I70229 Atherosclerosis of native arteries of extremities with rest pain, unspecified extremity: Secondary | ICD-10-CM | POA: Diagnosis present

## 2017-09-04 DIAGNOSIS — E785 Hyperlipidemia, unspecified: Secondary | ICD-10-CM | POA: Diagnosis not present

## 2017-09-04 DIAGNOSIS — Z880 Allergy status to penicillin: Secondary | ICD-10-CM | POA: Diagnosis not present

## 2017-09-04 DIAGNOSIS — M79605 Pain in left leg: Secondary | ICD-10-CM | POA: Diagnosis present

## 2017-09-04 HISTORY — PX: PERIPHERAL VASCULAR INTERVENTION: CATH118257

## 2017-09-04 HISTORY — PX: ABDOMINAL AORTOGRAM W/LOWER EXTREMITY: CATH118223

## 2017-09-04 LAB — POCT ACTIVATED CLOTTING TIME
Activated Clotting Time: 230 seconds
Activated Clotting Time: 219 seconds
Activated Clotting Time: 191 seconds

## 2017-09-04 LAB — POCT I-STAT, CHEM 8
BUN: 24 mg/dL — ABNORMAL HIGH (ref 6–20)
Calcium, Ion: 1.15 mmol/L (ref 1.15–1.40)
Chloride: 104 mmol/L (ref 101–111)
Creatinine, Ser: 1.5 mg/dL — ABNORMAL HIGH (ref 0.61–1.24)
Glucose, Bld: 98 mg/dL (ref 65–99)
HCT: 43 % (ref 39.0–52.0)
Hemoglobin: 14.6 g/dL (ref 13.0–17.0)
Potassium: 4.6 mmol/L (ref 3.5–5.1)
Sodium: 141 mmol/L (ref 135–145)
TCO2: 27 mmol/L (ref 22–32)

## 2017-09-04 SURGERY — ABDOMINAL AORTOGRAM W/LOWER EXTREMITY
Anesthesia: LOCAL

## 2017-09-04 MED ORDER — MIDAZOLAM HCL 2 MG/2ML IJ SOLN
INTRAMUSCULAR | Status: DC | PRN
  Administered 2017-09-04: 1 mg via INTRAVENOUS

## 2017-09-04 MED ORDER — OXYCODONE HCL 5 MG PO TABS
ORAL_TABLET | ORAL | Status: DC
Start: 2017-09-04 — End: 2017-09-05
  Filled 2017-09-04: qty 1

## 2017-09-04 MED ORDER — HEPARIN (PORCINE) IN NACL 2-0.9 UNIT/ML-% IJ SOLN
INTRAMUSCULAR | Status: AC | PRN
  Administered 2017-09-04: 1000 mL

## 2017-09-04 MED ORDER — SODIUM CHLORIDE 0.9% FLUSH: 3.0000 mL | INTRAVENOUS | Status: DC | PRN

## 2017-09-04 MED ORDER — SODIUM CHLORIDE 0.9 % IV SOLN
INTRAVENOUS | Status: DC
  Administered 2017-09-04: 10:00:00 via INTRAVENOUS

## 2017-09-04 MED ORDER — SODIUM CHLORIDE 0.9 % IV SOLN: 250.0000 mL | INTRAVENOUS | Status: DC | PRN

## 2017-09-04 MED ORDER — HYDRALAZINE HCL 20 MG/ML IJ SOLN: 5.0000 mg | INTRAMUSCULAR | Status: DC | PRN

## 2017-09-04 MED ORDER — MORPHINE SULFATE (PF) 10 MG/ML IV SOLN: 2.0000 mg | INTRAVENOUS | Status: DC | PRN

## 2017-09-04 MED ORDER — FENTANYL CITRATE (PF) 100 MCG/2ML IJ SOLN
INTRAMUSCULAR | Status: DC | PRN
  Administered 2017-09-04 (×2): 50 ug via INTRAVENOUS

## 2017-09-04 MED ORDER — OXYCODONE HCL 5 MG PO TABS
5.0000 mg | ORAL_TABLET | ORAL | Status: DC | PRN
Start: 2017-09-04 — End: 2017-09-05
  Administered 2017-09-04: 5 mg via ORAL

## 2017-09-04 MED ORDER — HEPARIN (PORCINE) IN NACL 2-0.9 UNIT/ML-% IJ SOLN
INTRAMUSCULAR | Status: AC
  Filled 2017-09-04: qty 1000

## 2017-09-04 MED ORDER — MIDAZOLAM HCL 2 MG/2ML IJ SOLN
INTRAMUSCULAR | Status: AC
  Filled 2017-09-04: qty 2

## 2017-09-04 MED ORDER — SODIUM CHLORIDE 0.9 % WEIGHT BASED INFUSION: 1.0000 mL/kg/h | INTRAVENOUS | Status: DC

## 2017-09-04 MED ORDER — LIDOCAINE HCL 2 % IJ SOLN
INTRAMUSCULAR | Status: DC | PRN
  Administered 2017-09-04: 15 mL

## 2017-09-04 MED ORDER — IODIXANOL 320 MG/ML IV SOLN
INTRAVENOUS | Status: DC | PRN
  Administered 2017-09-04: 170 mL via INTRA_ARTERIAL

## 2017-09-04 MED ORDER — FENTANYL CITRATE (PF) 100 MCG/2ML IJ SOLN
INTRAMUSCULAR | Status: AC
  Filled 2017-09-04: qty 2

## 2017-09-04 MED ORDER — SODIUM CHLORIDE 0.9% FLUSH: 3.0000 mL | Freq: Two times a day (BID) | INTRAVENOUS | Status: DC

## 2017-09-04 MED ORDER — HEPARIN SODIUM (PORCINE) 1000 UNIT/ML IJ SOLN
INTRAMUSCULAR | Status: DC | PRN
Start: 2017-09-04 — End: 2017-09-04
  Administered 2017-09-04: 9000 [IU] via INTRAVENOUS

## 2017-09-04 MED ORDER — HEPARIN SODIUM (PORCINE) 1000 UNIT/ML IJ SOLN
INTRAMUSCULAR | Status: AC
  Filled 2017-09-04: qty 1

## 2017-09-04 MED ORDER — LABETALOL HCL 5 MG/ML IV SOLN: 10.0000 mg | INTRAVENOUS | Status: DC | PRN

## 2017-09-04 SURGICAL SUPPLY — 14 items
CATH OMNI FLUSH 5F 65CM (CATHETERS) ×1 IMPLANT
COVER PRB 48X5XTLSCP FOLD TPE (BAG) IMPLANT
COVER PROBE 5X48 (BAG) ×3
KIT ENCORE 26 ADVANTAGE (KITS) ×1 IMPLANT
KIT MICROINTRODUCER STIFF 5F (SHEATH) ×1 IMPLANT
KIT PV (KITS) ×3 IMPLANT
SHEATH BRITE TIP 7FR 35CM (SHEATH) ×1 IMPLANT
SHEATH PINNACLE 5F 10CM (SHEATH) ×1 IMPLANT
STENT OMNILINK ELITE 9X19X80 (Permanent Stent) ×1 IMPLANT
SYR MEDRAD MARK V 150ML (SYRINGE) ×3 IMPLANT
TRANSDUCER W/STOPCOCK (MISCELLANEOUS) ×3 IMPLANT
TRAY PV CATH (CUSTOM PROCEDURE TRAY) ×3 IMPLANT
WIRE BENTSON .035X145CM (WIRE) ×1 IMPLANT
WIRE ROSEN-J .035X260CM (WIRE) ×1 IMPLANT

## 2017-09-04 NOTE — Discharge Instructions (Signed)
Angiogram, Care After °This sheet gives you information about how to care for yourself after your procedure. Your health care provider may also give you more specific instructions. If you have problems or questions, contact your health care provider. °What can I expect after the procedure? °After the procedure, it is common to have bruising and tenderness at the catheter insertion area. °Follow these instructions at home: °Insertion site care  °· Follow instructions from your health care provider about how to take care of your insertion site. Make sure you: °¨ Wash your hands with soap and water before you change your bandage (dressing). If soap and water are not available, use hand sanitizer. °¨ Change your dressing as told by your health care provider. °¨ Leave stitches (sutures), skin glue, or adhesive strips in place. These skin closures may need to stay in place for 2 weeks or longer. If adhesive strip edges start to loosen and curl up, you may trim the loose edges. Do not remove adhesive strips completely unless your health care provider tells you to do that. °· Do not take baths, swim, or use a hot tub until your health care provider approves. °· You may shower 24-48 hours after the procedure or as told by your health care provider. °¨ Gently wash the site with plain soap and water. °¨ Pat the area dry with a clean towel. °¨ Do not rub the site. This may cause bleeding. °· Do not apply powder or lotion to the site. Keep the site clean and dry. °· Check your insertion site every day for signs of infection. Check for: °¨ Redness, swelling, or pain. °¨ Fluid or blood. °¨ Warmth. °¨ Pus or a bad smell. °Activity  °· Rest as told by your health care provider, usually for 1-2 days. °· Do not lift anything that is heavier than 10 lbs. (4.5 kg) or as told by your health care provider. °· Do not drive for 24 hours if you were given a medicine to help you relax (sedative). °· Do not drive or use heavy machinery while  taking prescription pain medicine. °General instructions  °· Return to your normal activities as told by your health care provider, usually in about a week. Ask your health care provider what activities are safe for you. °· If the catheter site starts bleeding, lie flat and put pressure on the site. If the bleeding does not stop, get help right away. This is a medical emergency. °· Drink enough fluid to keep your urine clear or pale yellow. This helps flush the contrast dye from your body. °· Take over-the-counter and prescription medicines only as told by your health care provider. °· Keep all follow-up visits as told by your health care provider. This is important. °Contact a health care provider if: °· You have a fever or chills. °· You have redness, swelling, or pain around your insertion site. °· You have fluid or blood coming from your insertion site. °· The insertion site feels warm to the touch. °· You have pus or a bad smell coming from your insertion site. °· You have bruising around the insertion site. °· You notice blood collecting in the tissue around the catheter site (hematoma). The hematoma may be painful to the touch. °Get help right away if: °· You have severe pain at the catheter insertion area. °· The catheter insertion area swells very fast. °· The catheter insertion area is bleeding, and the bleeding does not stop when you hold steady pressure on   the area. °· The area near or just beyond the catheter insertion site becomes pale, cool, tingly, or numb. °These symptoms may represent a serious problem that is an emergency. Do not wait to see if the symptoms will go away. Get medical help right away. Call your local emergency services (911 in the [place] Do not drive yourself to the hospital. °Summary °· After the procedure, it is common to have bruising and tenderness at the catheter insertion area. °· After the procedure, it is important to rest and drink plenty of fluids. °· Do not take baths,  swim, or use a hot tub until your health care provider says it is okay to do so. You may shower 24-48 hours after the procedure or as told by your health care provider. °· If the catheter site starts bleeding, lie flat and put pressure on the site. If the bleeding does not stop, get help right away. This is a medical emergency. °This information is not intended to replace advice given to you by your health care provider. Make sure you discuss any questions you have with your health care provider. °Document Released: 05/23/2005 Document Revised: 10/09/2016 Document Reviewed: 10/09/2016 °Elsevier Interactive Patient Education © 2017 Else[employer] Inc[employer] ° °

## 2017-09-04 NOTE — Progress Notes (Addendum)
Site area: Left  Groin a 7 french arterial sheath was removed  Site Prior to Removal:  Level 0  Pressure Applied For 20 MINUTES    Bedrest Beginning at 1715p  Manual:   Yes.    Patient Status During Pull:  stable  Post Pull Groin Site:  Level 0  Post Pull Instructions Given:  Yes.    Post Pull Pulses Present:  Yes.    Dressing Applied:  Yes.    Comments:  VS remain stable

## 2017-09-04 NOTE — H&P (View-Only) (Signed)
Requested by:  Sinda Du, MD [place], [place] [address]  Reason for consultation: PAD   History of Present Illness   Dwayne Cuevas is a 66 y.o. (May 27, 1951) male who presents with chief complaint: right leg pain.  Onset of symptoms occurred > 1.5 years ago.  Pain is described as aching at rest, waking him from rest, severity 10/10, and associated with ambulation and rest.  Patient has attempted to treat this pain with NSAID.  The patient has rest pain symptoms.  The patient has no leg wounds/ulcers.    Atherosclerotic risk factors include: HLD, HTN, active smoker.  Past Medical History:  Diagnosis Date  . COPD (chronic obstructive pulmonary disease) ([place])   . DVT (deep venous thrombosis) ([place]) 02/05/2016  . Gout   . Hypercholesteremia   . Hypertension   . PE (pulmonary embolism) 02/05/2016  . Scoliosis     Past Surgical History:  Procedure Laterality Date  . BACK SURGERY    . SKIN CANCER EXCISION      Social History   Social History  . Marital status: Divorced    Spouse name: N/A  . Number of children: N/A  . Years of education: N/A   Occupational History  . Not on file.   Social History Main Topics  . Smoking status: Current Every Day Smoker    Packs/day: 1.00    Types: Cigarettes  . Smokeless tobacco: Never Used  . Alcohol use Yes  . Drug use: Unknown  . Sexual activity: Not on file   Other Topics Concern  . Not on file   Social History Narrative  . No narrative on file    Family History  Problem Relation Age of Onset  . Heart disease Mother   . Heart disease Father   . Cystic fibrosis Paternal Aunt     Current Outpatient Prescriptions  Medication Sig Dispense Refill  . allopurinol (ZYLOPRIM) 300 MG tablet Take 1 tablet (300 mg total) by mouth daily. 30 tablet 2  . aspirin EC 81 MG tablet Take 81 mg by mouth daily.    [name] Kitchen co-enzyme Q-10 30 MG capsule Take 30 mg by mouth daily.    [name] Kitchen Cod Liver Oil CAPS Take 1  capsule by mouth at bedtime.    . enalapril-hydrochlorothiazide (VASERETIC) 10-25 MG tablet Take 1 tablet by mouth daily.    [name] Kitchen ibuprofen (ADVIL,MOTRIN) 800 MG tablet Take 800 mg by mouth daily.     [name] Kitchen lovastatin (MEVACOR) 40 MG tablet Take 40 mg by mouth at bedtime.    . Vitamin E 100 units TABS Take 1 tablet by mouth daily.     No current facility-administered medications for this visit.     Allergies  Allergen Reactions  . Penicillins Rash    Has patient had a PCN reaction causing immediate rash, facial/tongue/throat swelling, SOB or lightheadedness with hypotension: Yes Has patient had a PCN reaction causing severe rash involving mucus membranes or skin necrosis: No Has patient had a PCN reaction that required hospitalization No Has patient had a PCN reaction occurring within the last 10 years: No If all of the above answers are "NO", then may proceed with Cephalosporin use.     REVIEW OF SYSTEMS (negative unless checked):   Cardiac:  []  Chest pain or chest pressure? [x]  Shortness of breath upon activity? []  Shortness of breath when lying flat? []  Irregular heart rhythm?  Vascular:  [x]  Pain in calf, thigh, or hip brought on by  walking? [x]  Pain in feet at night that wakes you up from your sleep? [x]  Blood clot in your veins? [x]  Leg swelling?  Pulmonary:  []  Oxygen at home? []  Productive cough? []  Wheezing?  Neurologic:  []  Sudden weakness in arms or legs? [x]  Sudden numbness in arms or legs? []  Sudden onset of difficult speaking or slurred speech? []  Temporary loss of vision in one eye? [x]  Problems with dizziness?  Gastrointestinal:  []  Blood in stool? []  Vomited blood?  Genitourinary:  []  Burning when urinating? []  Blood in urine?  Psychiatric:  []  Major depression  Hematologic:  []  Bleeding problems? []  Problems with blood clotting?  Dermatologic:  []  Rashes or ulcers?  Constitutional:  []  Fever or chills?  Ear/Nose/Throat:  []  Change in  hearing? []  Nose bleeds? []  Sore throat?  Musculoskeletal:  []  Back pain? []  Joint pain? []  Muscle pain?   For VQI Use Only   PRE-ADM LIVING Home  AMB STATUS Ambulatory  CAD Sx None  PRIOR CHF None  STRESS TEST Normal    Physical Examination     Vitals:   08/29/17 1215 08/29/17 1216  BP: (!) 142/73 140/73  Pulse: 70   Resp: 20   Temp: (!) 97.3 F (36.3 C)   TempSrc: Oral   SpO2: 100%   Weight: 189 lb 4.8 oz (85.9 kg)   Height: 6' (1.829 m)    Body mass index is 25.67 kg/m.  General Alert, O x 3, WD, NAD  Head [place]/AT,    Ear/Nose/ Throat Hearing grossly intact, nares without erythema or drainage, oropharynx without Erythema or Exudate, Mallampati score: 3,   Eyes PERRLA, EOMI,    Neck Supple, mid-line trachea,    Pulmonary Sym exp, good B air movt, CTA B  Cardiac RRR, Nl S1, S2, no Murmurs, No rubs, No S3,S4  Vascular Vessel Right Left  Radial Palpable Palpable  Brachial Palpable Palpable  Carotid Palpable, No Bruit Palpable, No Bruit  Aorta Not palpable N/A  Femoral Not palpable Faintly palpable  Popliteal Not palpable Not palpable  PT Not palpable Not palpable  DP Not palpable Not palpable    Gastro- intestinal soft, non-distended, non-tender to palpation, No guarding or rebound, no HSM, no masses, no CVAT B, No palpable prominent aortic pulse,    Musculo- skeletal M/S 5/5 throughout  , Extremities without ischemic changes  , Non-pitting edema present: R 1+, Varicosities present: B small, No Lipodermatosclerosis present, B feet cyanotic, no frank ulcers or gangrene  Neurologic Cranial nerves 2-12 intact , Pain and light touch intact in extremities , Motor exam as listed above  Psychiatric Judgement intact, Mood & affect appropriate for pt's clinical situation  Dermatologic See M/S exam for extremity exam, No rashes otherwise noted  Lymphatic  Palpable lymph nodes: None    Non-Invasive Vascular Imaging   Outside BLE Physiologic (07/11/17)  R:    ABI: 0.36,   Severe inflow disease and tandem infrapopliteal occlusive disease  L:   ABI: 0.62,   Inflow and runoff disease suggested  BLE Arterial Duplex (08/29/2017)  R:   Occluded CFA  PFA: 63 c/s  SFA: occluded  Pop: 26 c/s  AT: 11 c/s  PT: 11 c/s  L: patent throughout   Outside Studies/Documentation   6 pages of outside documents were reviewed including: outpatient PCP chart and outside physiologic studies.   Medical Decision Making   Dwayne Cuevas is a 66 y.o. male who presents with: RLE critical limb ischemia, moderate L PAD  I discussed with the patient the natural history of critical limb ischemia: 25% require amputation in one year, 50% are able to maintain their limbs in one year, and 25-30% die in one year due to comorbidities.  Given the limb threatening status of this patient, I recommend an aggressive work up including proceeding with an: Aortogram, Bilateral runoff and possible R leg intervention. I discussed with the patient the nature of angiographic procedures, especially the limited patencies of any endovascular intervention. The patient is aware of that the risks of an angiographic procedure include but are not limited to: bleeding, infection, access site complications, embolization, rupture of treated vessel, dissection, possible need for emergent surgical intervention, and possible need for surgical procedures to treat the patient's pathology. The patient is aware of the risks and agrees to proceed.  The procedure is scheduled for: 18 OCT 18.  I discussed in depth with the patient the nature of atherosclerosis, and emphasized the importance of maximal medical management including strict control of blood pressure, blood glucose, and lipid levels, antiplatelet agents, obtaining regular exercise, and cessation of smoking.  The patient is aware that without maximal medical management the underlying atherosclerotic disease process will progress,  limiting the benefit of any interventions. The patient is currently Mevacor  The patient is currently on an anti-platelet: ASA.  Pt reportedly has had worsening of his Cr recently. I told patient to stop taking Motrin. I gave the patient a prescription for: Percocet 5/325 mg 1 PO q6 hr prn pain #30 no refills to help his rest pain.  Thank you for allowing [place] to participate in this patient's care.   Adele Barthel, MD, FACS [facility] Office: [phone] Pager: [phone]  08/29/2017, 12:40 PM

## 2017-09-04 NOTE — Interval H&P Note (Signed)
   History and Physical Update  The patient was interviewed and re-examined.  The patient's previous History and Physical has been reviewed and is unchanged from my consult.  There is no change in the plan of care: aortogram, bilateral leg runoff, and possible intervention.   I discussed with the patient the nature of angiographic procedures, especially the limited patencies of any endovascular intervention.    The patient is aware of that the risks of an angiographic procedure include but are not limited to: bleeding, infection, access site complications, renal failure, embolization, rupture of vessel, dissection, arteriovenous fistula, possible need for emergent surgical intervention, possible need for surgical procedures to treat the patient's pathology, anaphylactic reaction to contrast, and stroke and death.    The patient is aware of the risks and agrees to proceed.   Adele Barthel, MD, FACS [facility] Office: [phone] Pager: [phone]  09/04/2017, 12:37 PM

## 2017-09-04 NOTE — Op Note (Signed)
OPERATIVE NOTE   PROCEDURE: 1.  Left common femoral artery cannulation under ultrasound guidance 2.  Placement of catheter in aorta 3.  Aortogram 4.  Left common iliac artery angioplasty and stenting (9 mm x 19 mm) 5.  Bilateral runoff via catheter 6.  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, bilateral rest pain  POST-OPERATIVE DIAGNOSIS: same as above   SURGEON: Adele Barthel, MD  ANESTHESIA: conscious sedation  ESTIMATED BLOOD LOSS: 50 cc  CONTRAST: 170 cc  FINDING(S):  Aorta: patent, impressive of infrarenal abdominal aortic aneurysm   Superior mesenteric artery: patent Celiac artery: patent Inferior mesenteric artery: patent, along with hypertrophied right lumbar artery provides perfusion of right leg Large right lumbar artery in distal aorta: 75-90% proximal stenosis   Right Left  RA patent patent  CIA occluded Patent with >90% stenosis distally: resolved with stenting and angioplasty  EIA occluded patent  IIA occluded Patent, >90% proximal stenosis  CFA occluded patent  SFA occluded patent  PFA Patent, extensive collaterals patent  Pop Reconstitutes from profunda collaterals in above-the-knee position, underfills patent  Trif patent patent  AT patent patent  Pero patent patent  PT patent patent   SPECIMEN(S):  none  INDICATIONS:   Dwayne Cuevas is a 66 y.o. 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  The patient presents for: aortogram, bilateral leg runoff, and possible intervention.  I discussed with the patient the nature of angiographic procedures, especially the limited patencies of any endovascular intervention.  The patient is aware of that the risks of an angiographic procedure include but are not limited to: bleeding, infection, access site complications, renal failure, embolization, rupture of vessel, dissection, possible need for emergent  surgical intervention, possible need for surgical procedures to treat the patient's pathology, and stroke and death.  The patient is aware of the risks and agrees to proceed.  DESCRIPTION: After full informed consent was obtained from the patient, the patient was brought back to the angiography suite.  The patient was placed supine upon the angiography table and connected to cardiopulmonary monitoring equipment.  The patient was then given conscious sedation, the amounts of which are documented in the patient's chart.  A circulating radiologic technician maintained continuous monitoring of the patient's cardiopulmonary status.  Additionally, the control room radiologic technician provided backup monitoring throughout the procedure.  The patient was prepped and drape in the standard fashion for an angiographic procedure.  At this point, attention was turned to the left groin.  Under ultrasound guidance, the subcutaneous tissue surrounding the left common femoral artery was anesthesized with 1% lidocaine with epinephrine.  The artery was then cannulated with a micropuncture needle.  The microwire was advanced into the iliac arterial system.  The needle was exchanged for a microsheath, which was loaded into the common femoral artery over the wire.  The microwire was exchanged for a Bentson wire which was advanced into the proximal left common iliac artery.  The wire would not advance into aorta.  The microsheath was then exchanged for a 5-Fr sheath which was loaded into the common femoral artery.  The Omniflush catheter was then loaded over the wire and would not advance past the common iliac artery.  With some rotation of the catheter, eventually I was able to get the wire into the aorta.  The catheter was advanced up to the level of L1.  The catheter was connected to the power  injector circuit.  After de-airring and de-clotting the circuit, a power injector aortogram was completed.    The findings are listed  above.  I felt stenting the left common iliac artery was indicated in the event the patient was not a candidate for aortobifemoral bypass due to cardiac disease and femorofemoral bypass was necessary.  I did a magnified RAO pelvic injection to identify the distal aortic bifurcation.  Based on measurements, I felt a 9 mm x 19 mm balloon expandable stent was indicated.  I placed a Rosen wire through the left catheter and then removed the catheter.  The left femoral sheath was exchanged for a long 7-Fr sheath.  The patient was given 9000 units of Heparin intravenously, which was a therapeutic bolus.   I advanced the 7-Fr sheath into the aorta.  I then placed the 9 mm x 19 mm balloon expandable stent through the sheath.  The sheath was pulled back and then I positioned the stent with minimal protrusion in the aorta.  The stent was deployed at 11 ATM for 1 minute.  The balloon was deflated and removed.  I did a hand injection which demonstrated resolution of the stenosis with dense calcific plaque adjacent to the stent.  I replaced the dilator in the sheath and advanced it back into the distal aorta.  The dilator was removed and then the Omniflush catheter was loaded over the wire and advanced proximal to the Inferior mesenteric artery and hypertrophied right lumbar artery.  The sheath was pulled back into the left external iliac artery.  The wire was removed and then the catheter was connected to the power injector circuit.  An automated bilateral leg runoff was completed.  The findings are listed above.  Based on the images, ideally this patient needs an aortobifemoral bypass.  The angiogram demonstrated bilateral calf perfusion with intact tibial arteries, so this aortobifemoral bypass does not need to be done in an emergent fashion.  Pre-operative optimization will be necessary to limit the significant mortality and morbidity risk of the procedure.   COMPLICATIONS: none  CONDITION: stable   Adele Barthel,  MD, [facility] [place] Office: [phone] Pager: [phone]  09/04/2017, 3:59 PM

## 2017-09-05 ENCOUNTER — Encounter (HOSPITAL_COMMUNITY): Payer: Self-pay | Admitting: Vascular Surgery

## 2017-09-05 ENCOUNTER — Telehealth: Payer: Self-pay | Admitting: Vascular Surgery

## 2017-09-05 NOTE — Telephone Encounter (Signed)
Sched lab 09/23/17 at 4:00 and MD at 09/26/17. Spoke to pt.

## 2017-09-05 NOTE — Telephone Encounter (Signed)
-----   Message from Mena Goes, RN sent at 09/05/2017  9:32 AM EDT ----- Regarding: 2-3 weeks w/ bilateral GSV map   ----- Message ----- From: Conrad [place], MD Sent: 09/04/2017   4:16 PM To: [facility] [address]  Dwayne Cuevas [id number] 1951-08-04  PROCEDURE: 1.  Left common femoral artery cannulation under ultrasound guidance 2.  Placement of catheter in aorta 3.  Aortogram 4.  Left common iliac artery angioplasty and stenting (9 mm x 19 mm) 5.  Bilateral runoff via catheter 6.  Conscious sedation for 57 minutes  Follow-up: 2-3 weeks  Orders(s) for follow-up: BLE GSV mapping

## 2017-09-08 ENCOUNTER — Other Ambulatory Visit: Payer: Self-pay

## 2017-09-08 DIAGNOSIS — I998 Other disorder of circulatory system: Secondary | ICD-10-CM

## 2017-09-08 DIAGNOSIS — Z48812 Encounter for surgical aftercare following surgery on the circulatory system: Secondary | ICD-10-CM

## 2017-09-08 DIAGNOSIS — I70229 Atherosclerosis of native arteries of extremities with rest pain, unspecified extremity: Secondary | ICD-10-CM

## 2017-09-23 ENCOUNTER — Ambulatory Visit (HOSPITAL_COMMUNITY)
Admission: RE | Admit: 2017-09-23 | Discharge: 2017-09-23 | Disposition: A | Discharge status: Home / Self Care | Payer: Medicare Other | Source: Ambulatory Visit | Attending: Vascular Surgery | Admitting: Vascular Surgery

## 2017-09-23 DIAGNOSIS — I998 Other disorder of circulatory system: Secondary | ICD-10-CM | POA: Insufficient documentation

## 2017-09-23 DIAGNOSIS — I70229 Atherosclerosis of native arteries of extremities with rest pain, unspecified extremity: Secondary | ICD-10-CM

## 2017-09-23 DIAGNOSIS — Z48812 Encounter for surgical aftercare following surgery on the circulatory system: Secondary | ICD-10-CM

## 2017-09-26 ENCOUNTER — Other Ambulatory Visit: Payer: Self-pay | Admitting: *Deleted

## 2017-09-26 ENCOUNTER — Encounter: Payer: Self-pay | Admitting: Vascular Surgery

## 2017-09-26 ENCOUNTER — Encounter: Payer: Self-pay | Admitting: *Deleted

## 2017-09-26 ENCOUNTER — Ambulatory Visit (INDEPENDENT_AMBULATORY_CARE_PROVIDER_SITE_OTHER): Payer: Medicare Other | Admitting: Vascular Surgery

## 2017-09-26 VITALS — BP 114/67 | HR 74 | Temp 97.8°F | Resp 16 | Ht 72.0 in | Wt 187.0 lb

## 2017-09-26 DIAGNOSIS — I998 Other disorder of circulatory system: Secondary | ICD-10-CM

## 2017-09-26 DIAGNOSIS — I70229 Atherosclerosis of native arteries of extremities with rest pain, unspecified extremity: Secondary | ICD-10-CM

## 2017-09-26 MED ORDER — OXYCODONE-ACETAMINOPHEN 5-325 MG PO TABS: 1.0000 | ORAL_TABLET | ORAL | 0 refills | Status: DC | PRN

## 2017-09-26 NOTE — Progress Notes (Addendum)
Established Critical Limb Ischemia Patient   History of Present Illness   Dwayne Cuevas is a 66 y.o. (06/19/1951) male who presents with chief complaint: R leg rest pain.  The patient underwent angiography on 09/04/17 which demonstrated occluded R iliac arterial system, common femoral artery and superficial femoral artery with heavily calcified and stenosis L CIA.  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  I stented the L CIA to preserve L iliac arterial flow in the event his cardiac risk stratification was high risk.  The patient has been cleared for open surgery at this point by his cardiologist.  Patient notes numbness in his right foot is getting worse.       Past Medical History:  Diagnosis Date  . COPD (chronic obstructive pulmonary disease) ([place])   . DVT (deep venous thrombosis) ([place]) 02/05/2016  . Gout   . Hypercholesteremia   . Hypertension   . PE (pulmonary embolism) 02/05/2016  . Scoliosis     Past Surgical History:  Procedure Laterality Date  . BACK SURGERY    . SKIN CANCER EXCISION      Social History        Social History  . Marital status: Divorced    Spouse name: N/A  . Number of children: N/A  . Years of education: N/A      Occupational History  . Not on file.        Social History Main Topics  . Smoking status: Current Every Day Smoker    Packs/day: 1.00    Types: Cigarettes  . Smokeless tobacco: Never Used  . Alcohol use Yes  . Drug use: Unknown  . Sexual activity: Not on file       Other Topics Concern  . Not on file      Social History Narrative  . No narrative on file         Family History  Problem Relation Age of Onset  . Heart disease Mother   . Heart disease Father   . Cystic fibrosis Paternal Aunt           Current Outpatient Prescriptions  Medication Sig Dispense Refill  . allopurinol (ZYLOPRIM) 300 MG tablet Take 1 tablet (300 mg total) by mouth daily. 30 tablet 2  . aspirin EC 81 MG  tablet Take 81 mg by mouth daily.    [name] Kitchen co-enzyme Q-10 30 MG capsule Take 30 mg by mouth daily.    [name] Kitchen Cod Liver Oil CAPS Take 1 capsule by mouth at bedtime.    . enalapril-hydrochlorothiazide (VASERETIC) 10-25 MG tablet Take 1 tablet by mouth daily.    [name] Kitchen ibuprofen (ADVIL,MOTRIN) 800 MG tablet Take 800 mg by mouth daily.     [name] Kitchen lovastatin (MEVACOR) 40 MG tablet Take 40 mg by mouth at bedtime.    . Vitamin E 100 units TABS Take 1 tablet by mouth daily.     No current facility-administered medications for this visit.          Allergies  Allergen Reactions  . Penicillins Rash    Has patient had a PCN reaction causing immediate rash, facial/tongue/throat swelling, SOB or lightheadedness with hypotension: Yes Has patient had a PCN reaction causing severe rash involving mucus membranes or skin necrosis: No Has patient had a PCN reaction that required hospitalization No Has patient had a PCN reaction occurring within the last 10 years: No If all of the above answers are "NO", then may proceed with Cephalosporin use.  REVIEW OF SYSTEMS (negative unless checked):   Cardiac:  []  Chest pain or chest pressure? [x]  Shortness of breath upon activity? []  Shortness of breath when lying flat? []  Irregular heart rhythm?  Vascular:  [x]  Pain in calf, thigh, or hip brought on by walking? [x]  Pain in feet at night that wakes you up from your sleep? [x]  Blood clot in your veins? [x]  Leg swelling?  Pulmonary:  []  Oxygen at home? []  Productive cough? []  Wheezing?  Neurologic:  []  Sudden weakness in arms or legs? [x]  Sudden numbness in arms or legs? []  Sudden onset of difficult speaking or slurred speech? []  Temporary loss of vision in one eye? [x]  Problems with dizziness?  Gastrointestinal:  []  Blood in stool? []  Vomited blood?  Genitourinary:  []  Burning when urinating? []  Blood in urine?  Psychiatric:  []  Major depression  Hematologic:  []  Bleeding  problems? []  Problems with blood clotting?  Dermatologic:  []  Rashes or ulcers?  Constitutional:  []  Fever or chills?  Ear/Nose/Throat:  []  Change in hearing? []  Nose bleeds? []  Sore throat?  Musculoskeletal:  []  Back pain? []  Joint pain? []  Muscle pain?   Physical Examination   Vitals:   09/26/17 1013  BP: 114/67  Pulse: 74  Resp: 16  Temp: 97.8 F (36.6 C)  TempSrc: Oral  SpO2: 99%  Weight: 187 lb (84.8 kg)  Height: 6' (1.829 m)   Body mass index is 25.36 kg/m.  General Alert, O x 3, WD, NAD  Head [place]/AT,    Ear/Nose/ Throat Hearing grossly intact, nares without erythema or drainage, oropharynx without Erythema or Exudate, Mallampati score: 3,   Eyes PERRLA, EOMI,    Neck Supple, mid-line trachea,    Pulmonary Sym exp, good B air movt, CTA B  Cardiac RRR, Nl S1, S2, no Murmurs, No rubs, No S3,S4  Vascular Vessel Right Left  Radial Palpable Palpable  Brachial Palpable Palpable  Carotid Palpable, No Bruit Palpable, No Bruit  Aorta Not palpable N/A  Femoral Not palpable Faintly palpable  Popliteal Not palpable Not palpable  PT Not palpable Not palpable  DP Not palpable Not palpable    Gastro- intestinal soft, non-distended, non-tender to palpation, No guarding or rebound, no HSM, no masses, no CVAT B, No palpable prominent aortic pulse,    Musculo- skeletal M/S 5/5 throughout  , Extremities without ischemic changes  , Non-pitting edema present: R 1+, Varicosities present: B small, No Lipodermatosclerosis present, B feet cyanotic, no frank ulcers or gangrene  Neurologic Cranial nerves 2-12 intact , Pain and light touch intact in extremities , Motor exam as listed above  Psychiatric Judgement intact, Mood & affect appropriate for pt's clinical situation  Dermatologic See M/S exam for extremity exam, No rashes otherwise noted  Lymphatic  Palpable lymph nodes: None    Non-Invasive Vascular Imaging   BLE GSV Mapping (09/23/17)  R: chronic non-occlusive  DVT in R CFA extending into distal EIA and SFV, acceptable R GSV conduit  L: acceptable conduit to knee, maybe ok down to distal calf   Medical Decision Making   Dwayne Cuevas is a 66 y.o. 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 The patient is aware the risks of aortic surgery include but are not limited to: bleeding,  need for transfusion, infection, death, stroke, paralysis, wound complications, bowel injuries, impotence, bowel ischemia, extended ventilation and future ventral hernias.   Overall, I cited a mortality rate of 5-10% and morbidity rate of 30%. The patient has elected to proceed.  He is scheduled for 27 NOV 18, as I can't get him on the schedule earlier.  I discussed in depth with the patient the nature of atherosclerosis, and emphasized the importance of maximal medical management including strict control of blood pressure, blood glucose, and lipid levels, antiplatelet agents, obtaining regular exercise, and cessation of smoking.    The patient is aware that without maximal medical management the underlying atherosclerotic disease process will progress, limiting the benefit of any interventions. The patient is currently Mevacor  The patient is currently on an anti-platelet: ASA.   I gave the patient a prescription for: Percocet 5/325 mg 1 PO q6 hr prn pain #30 no refills to help his rest pain. Reportedly his pharmacy only gave him 20 pills previously.  Thank you for allowing [place] to participate in this patient's care.   Dwayne Barthel, Dwayne Cuevas, FACS [facility] [place] Office: [phone] Pager: [phone]

## 2017-09-26 NOTE — H&P (View-Only) (Signed)
Established Critical Limb Ischemia Patient   History of Present Illness   Dwayne Cuevas is a 66 y.o. (04/07/1951) male who presents with chief complaint: R leg rest pain.  The patient underwent angiography on 09/04/17 which demonstrated occluded R iliac arterial system, common femoral artery and superficial femoral artery with heavily calcified and stenosis L CIA.  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  I stented the L CIA to preserve L iliac arterial flow in the event his cardiac risk stratification was high risk.  The patient has been cleared for open surgery at this point by his cardiologist.  Patient notes numbness in his right foot is getting worse.       Past Medical History:  Diagnosis Date  . COPD (chronic obstructive pulmonary disease) ([place])   . DVT (deep venous thrombosis) ([place]) 02/05/2016  . Gout   . Hypercholesteremia   . Hypertension   . PE (pulmonary embolism) 02/05/2016  . Scoliosis     Past Surgical History:  Procedure Laterality Date  . BACK SURGERY    . SKIN CANCER EXCISION      Social History        Social History  . Marital status: Divorced    Spouse name: N/A  . Number of children: N/A  . Years of education: N/A      Occupational History  . Not on file.        Social History Main Topics  . Smoking status: Current Every Day Smoker    Packs/day: 1.00    Types: Cigarettes  . Smokeless tobacco: Never Used  . Alcohol use Yes  . Drug use: Unknown  . Sexual activity: Not on file       Other Topics Concern  . Not on file      Social History Narrative  . No narrative on file         Family History  Problem Relation Age of Onset  . Heart disease Mother   . Heart disease Father   . Cystic fibrosis Paternal Aunt           Current Outpatient Prescriptions  Medication Sig Dispense Refill  . allopurinol (ZYLOPRIM) 300 MG tablet Take 1 tablet (300 mg total) by mouth daily. 30 tablet 2  . aspirin EC 81 MG  tablet Take 81 mg by mouth daily.    [name] Kitchen co-enzyme Q-10 30 MG capsule Take 30 mg by mouth daily.    [name] Kitchen Cod Liver Oil CAPS Take 1 capsule by mouth at bedtime.    . enalapril-hydrochlorothiazide (VASERETIC) 10-25 MG tablet Take 1 tablet by mouth daily.    [name] Kitchen ibuprofen (ADVIL,MOTRIN) 800 MG tablet Take 800 mg by mouth daily.     [name] Kitchen lovastatin (MEVACOR) 40 MG tablet Take 40 mg by mouth at bedtime.    . Vitamin E 100 units TABS Take 1 tablet by mouth daily.     No current facility-administered medications for this visit.          Allergies  Allergen Reactions  . Penicillins Rash    Has patient had a PCN reaction causing immediate rash, facial/tongue/throat swelling, SOB or lightheadedness with hypotension: Yes Has patient had a PCN reaction causing severe rash involving mucus membranes or skin necrosis: No Has patient had a PCN reaction that required hospitalization No Has patient had a PCN reaction occurring within the last 10 years: No If all of the above answers are "NO", then may proceed with Cephalosporin use.  REVIEW OF SYSTEMS (negative unless checked):   Cardiac:  []  Chest pain or chest pressure? [x]  Shortness of breath upon activity? []  Shortness of breath when lying flat? []  Irregular heart rhythm?  Vascular:  [x]  Pain in calf, thigh, or hip brought on by walking? [x]  Pain in feet at night that wakes you up from your sleep? [x]  Blood clot in your veins? [x]  Leg swelling?  Pulmonary:  []  Oxygen at home? []  Productive cough? []  Wheezing?  Neurologic:  []  Sudden weakness in arms or legs? [x]  Sudden numbness in arms or legs? []  Sudden onset of difficult speaking or slurred speech? []  Temporary loss of vision in one eye? [x]  Problems with dizziness?  Gastrointestinal:  []  Blood in stool? []  Vomited blood?  Genitourinary:  []  Burning when urinating? []  Blood in urine?  Psychiatric:  []  Major depression  Hematologic:  []  Bleeding  problems? []  Problems with blood clotting?  Dermatologic:  []  Rashes or ulcers?  Constitutional:  []  Fever or chills?  Ear/Nose/Throat:  []  Change in hearing? []  Nose bleeds? []  Sore throat?  Musculoskeletal:  []  Back pain? []  Joint pain? []  Muscle pain?   Physical Examination   Vitals:   09/26/17 1013  BP: 114/67  Pulse: 74  Resp: 16  Temp: 97.8 F (36.6 C)  TempSrc: Oral  SpO2: 99%  Weight: 187 lb (84.8 kg)  Height: 6' (1.829 m)   Body mass index is 25.36 kg/m.  General Alert, O x 3, WD, NAD  Head [place]/AT,    Ear/Nose/ Throat Hearing grossly intact, nares without erythema or drainage, oropharynx without Erythema or Exudate, Mallampati score: 3,   Eyes PERRLA, EOMI,    Neck Supple, mid-line trachea,    Pulmonary Sym exp, good B air movt, CTA B  Cardiac RRR, Nl S1, S2, no Murmurs, No rubs, No S3,S4  Vascular Vessel Right Left  Radial Palpable Palpable  Brachial Palpable Palpable  Carotid Palpable, No Bruit Palpable, No Bruit  Aorta Not palpable N/A  Femoral Not palpable Faintly palpable  Popliteal Not palpable Not palpable  PT Not palpable Not palpable  DP Not palpable Not palpable    Gastro- intestinal soft, non-distended, non-tender to palpation, No guarding or rebound, no HSM, no masses, no CVAT B, No palpable prominent aortic pulse,    Musculo- skeletal M/S 5/5 throughout  , Extremities without ischemic changes  , Non-pitting edema present: R 1+, Varicosities present: B small, No Lipodermatosclerosis present, B feet cyanotic, no frank ulcers or gangrene  Neurologic Cranial nerves 2-12 intact , Pain and light touch intact in extremities , Motor exam as listed above  Psychiatric Judgement intact, Mood & affect appropriate for pt's clinical situation  Dermatologic See M/S exam for extremity exam, No rashes otherwise noted  Lymphatic  Palpable lymph nodes: None    Non-Invasive Vascular Imaging   BLE GSV Mapping (09/23/17)  R: chronic non-occlusive  DVT in R CFA extending into distal EIA and SFV, acceptable R GSV conduit  L: acceptable conduit to knee, maybe ok down to distal calf   Medical Decision Making   Dwayne Cuevas is a 66 y.o. 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 The patient is aware the risks of aortic surgery include but are not limited to: bleeding,  need for transfusion, infection, death, stroke, paralysis, wound complications, bowel injuries, impotence, bowel ischemia, extended ventilation and future ventral hernias.   Overall, I cited a mortality rate of 5-10% and morbidity rate of 30%. The patient has elected to proceed.  He is scheduled for 27 NOV 18, as I can't get him on the schedule earlier.  I discussed in depth with the patient the nature of atherosclerosis, and emphasized the importance of maximal medical management including strict control of blood pressure, blood glucose, and lipid levels, antiplatelet agents, obtaining regular exercise, and cessation of smoking.    The patient is aware that without maximal medical management the underlying atherosclerotic disease process will progress, limiting the benefit of any interventions. The patient is currently Mevacor  The patient is currently on an anti-platelet: ASA.   I gave the patient a prescription for: Percocet 5/325 mg 1 PO q6 hr prn pain #30 no refills to help his rest pain. Reportedly his pharmacy only gave him 20 pills previously.  Thank you for allowing [place] to participate in this patient's care.   Adele Barthel, MD, FACS [facility] [place] Office: [phone] Pager: [phone]

## 2017-10-06 NOTE — Pre-Procedure Instructions (Signed)
ELERY CADENHEAD  10/06/2017      [facility] - [place], [place] - [id number] [place] [address] Phone: [phone] Fax: [phone]    Your procedure is scheduled on Tuesday November 27.  Report to [facility] Admitting at 7:30 A.M.  Call this number if you have problems the morning of surgery:  [phone]   Remember:  Do not eat food or drink liquids after midnight.  Take these medicines the morning of surgery with A SIP OF WATER:   Allopurinol (zyloprim) Oxycodone-acetaminophen (percocet)  7 days prior to surgery STOP taking any Aleve, Naproxen, Ibuprofen, Motrin, Advil, Goody's, BC's, all herbal medications, fish oil, and all vitamins  FOLLOW Surgeon's instructions on stopping Aspirin. If no instructions were given, please call surgeon's office.     Do not wear jewelry, make-up or nail polish.  Do not wear lotions, powders, or perfumes, or deoderant.  Do not shave 48 hours prior to surgery.  Men may shave face and neck.  Do not bring valuables to the hospital.  [facility] is not responsible for any belongings or valuables.  Contacts, dentures or bridgework may not be worn into surgery.  Leave your suitcase in the car.  After surgery it may be brought to your room.  For patients admitted to the hospital, discharge time will be determined by your treatment team.  Patients discharged the day of surgery will not be allowed to drive home.   Special instructions:    [facility]- Preparing For Surgery  Before surgery, you can play an important role. Because skin is not sterile, your skin needs to be as free of germs as possible. You can reduce the number of germs on your skin by washing with CHG (chlorahexidine gluconate) Soap before surgery.  CHG is an antiseptic cleaner which kills germs and bonds with the skin to continue killing germs even after washing.  Please do not use if you have an allergy to CHG or  antibacterial soaps. If your skin becomes reddened/irritated stop using the CHG.  Do not shave (including legs and underarms) for at least 48 hours prior to first CHG shower. It is OK to shave your face.  Please follow these instructions carefully.   1. Shower the NIGHT BEFORE SURGERY and the MORNING OF SURGERY with CHG.   2. If you chose to wash your hair, wash your hair first as usual with your normal shampoo.  3. After you shampoo, rinse your hair and body thoroughly to remove the shampoo.  4. Use CHG as you would any other liquid soap. You can apply CHG directly to the skin and wash gently with a scrungie or a clean washcloth.   5. Apply the CHG Soap to your body ONLY FROM THE NECK DOWN.  Do not use on open wounds or open sores. Avoid contact with your eyes, ears, mouth and genitals (private parts). Wash Face and genitals (private parts)  with your normal soap.  6. Wash thoroughly, paying special attention to the area where your surgery will be performed.  7. Thoroughly rinse your body with warm water from the neck down.  8. DO NOT shower/wash with your normal soap after using and rinsing off the CHG Soap.  9. Pat yourself dry with a CLEAN TOWEL.  10. Wear CLEAN PAJAMAS to bed the night before surgery, wear comfortable clothes the morning of surgery  11. Place CLEAN SHEETS on your bed the night of  your first shower and DO NOT SLEEP WITH PETS.    Day of Surgery: Do not apply any deodorants/lotions. Please wear clean clothes to the hospital/surgery center.      Please read over the following fact sheets that you were given. Coughing and Deep Breathing, MRSA Information and Surgical Site Infection Prevention

## 2017-10-07 ENCOUNTER — Encounter: Payer: Self-pay | Admitting: *Deleted

## 2017-10-07 ENCOUNTER — Encounter (HOSPITAL_COMMUNITY)
Admission: RE | Admit: 2017-10-07 | Discharge: 2017-10-07 | Disposition: A | Discharge status: Home / Self Care | Payer: Medicare Other | Source: Ambulatory Visit | Attending: Vascular Surgery | Admitting: Vascular Surgery

## 2017-10-07 ENCOUNTER — Encounter (HOSPITAL_COMMUNITY): Payer: Self-pay

## 2017-10-07 ENCOUNTER — Other Ambulatory Visit: Payer: Self-pay

## 2017-10-07 DIAGNOSIS — M419 Scoliosis, unspecified: Secondary | ICD-10-CM | POA: Diagnosis not present

## 2017-10-07 DIAGNOSIS — Z87891 Personal history of nicotine dependence: Secondary | ICD-10-CM | POA: Insufficient documentation

## 2017-10-07 DIAGNOSIS — M109 Gout, unspecified: Secondary | ICD-10-CM | POA: Diagnosis not present

## 2017-10-07 DIAGNOSIS — E7800 Pure hypercholesterolemia, unspecified: Secondary | ICD-10-CM | POA: Insufficient documentation

## 2017-10-07 DIAGNOSIS — J449 Chronic obstructive pulmonary disease, unspecified: Secondary | ICD-10-CM | POA: Diagnosis not present

## 2017-10-07 DIAGNOSIS — Z9889 Other specified postprocedural states: Secondary | ICD-10-CM | POA: Diagnosis not present

## 2017-10-07 DIAGNOSIS — Z23 Encounter for immunization: Secondary | ICD-10-CM | POA: Diagnosis not present

## 2017-10-07 DIAGNOSIS — I10 Essential (primary) hypertension: Secondary | ICD-10-CM | POA: Diagnosis not present

## 2017-10-07 DIAGNOSIS — Z01812 Encounter for preprocedural laboratory examination: Secondary | ICD-10-CM | POA: Insufficient documentation

## 2017-10-07 DIAGNOSIS — Z86711 Personal history of pulmonary embolism: Secondary | ICD-10-CM | POA: Insufficient documentation

## 2017-10-07 DIAGNOSIS — I2510 Atherosclerotic heart disease of native coronary artery without angina pectoris: Secondary | ICD-10-CM | POA: Diagnosis not present

## 2017-10-07 DIAGNOSIS — Z79899 Other long term (current) drug therapy: Secondary | ICD-10-CM | POA: Insufficient documentation

## 2017-10-07 DIAGNOSIS — Z7982 Long term (current) use of aspirin: Secondary | ICD-10-CM | POA: Insufficient documentation

## 2017-10-07 HISTORY — DX: Atherosclerotic heart disease of native coronary artery without angina pectoris: I25.10

## 2017-10-07 HISTORY — DX: Malignant (primary) neoplasm, unspecified: C80.1

## 2017-10-07 LAB — COMPREHENSIVE METABOLIC PANEL
ALT: 8 U/L — ABNORMAL LOW (ref 17–63)
AST: 14 U/L — ABNORMAL LOW (ref 15–41)
Albumin: 4 g/dL (ref 3.5–5.0)
Alkaline Phosphatase: 73 U/L (ref 38–126)
Anion gap: 9 (ref 5–15)
BUN: 22 mg/dL — ABNORMAL HIGH (ref 6–20)
CO2: 21 mmol/L — ABNORMAL LOW (ref 22–32)
Calcium: 9.4 mg/dL (ref 8.9–10.3)
Chloride: 109 mmol/L (ref 101–111)
Creatinine, Ser: 1.49 mg/dL — ABNORMAL HIGH (ref 0.61–1.24)
GFR calc Af Amer: 55 mL/min — ABNORMAL LOW (ref >60)
GFR calc non Af Amer: 48 mL/min — ABNORMAL LOW (ref >60)
Glucose, Bld: 107 mg/dL — ABNORMAL HIGH (ref 65–99)
Potassium: 4.6 mmol/L (ref 3.5–5.1)
Sodium: 139 mmol/L (ref 135–145)
Total Bilirubin: 0.4 mg/dL (ref 0.3–1.2)
Total Protein: 7.3 g/dL (ref 6.5–8.1)

## 2017-10-07 LAB — BLOOD GAS, ARTERIAL
Acid-base deficit: 1.8 mmol/L (ref 0.0–2.0)
Bicarbonate: 22.2 mmol/L (ref 20.0–28.0)
Drawn by: 27052
FIO2: 21
O2 Saturation: 95.1 %
Patient temperature: 98.6
pCO2 arterial: 36.4 mmHg (ref 32.0–48.0)
pH, Arterial: 7.403 (ref 7.350–7.450)
pO2, Arterial: 71.8 mmHg — ABNORMAL LOW (ref 83.0–108.0)

## 2017-10-07 LAB — APTT: aPTT: 30 seconds (ref 24–36)

## 2017-10-07 LAB — URINALYSIS, ROUTINE W REFLEX MICROSCOPIC
Bilirubin Urine: NEGATIVE
Glucose, UA: NEGATIVE mg/dL
Hgb urine dipstick: NEGATIVE
Ketones, ur: NEGATIVE mg/dL
Leukocytes, UA: NEGATIVE
Nitrite: NEGATIVE
Protein, ur: NEGATIVE mg/dL
Specific Gravity, Urine: 1.017 (ref 1.005–1.030)
pH: 5 (ref 5.0–8.0)

## 2017-10-07 LAB — CBC
HCT: 40 % (ref 39.0–52.0)
Hemoglobin: 13.1 g/dL (ref 13.0–17.0)
MCH: 30.8 pg (ref 26.0–34.0)
MCHC: 32.8 g/dL (ref 30.0–36.0)
MCV: 93.9 fL (ref 78.0–100.0)
Platelets: 207 10*3/uL (ref 150–400)
RBC: 4.26 MIL/uL (ref 4.22–5.81)
RDW: 13.2 % (ref 11.5–15.5)
WBC: 8.4 10*3/uL (ref 4.0–10.5)

## 2017-10-07 LAB — PROTIME-INR
INR: 1.03
Prothrombin Time: 13.4 seconds (ref 11.4–15.2)

## 2017-10-07 LAB — ABO/RH: ABO/RH(D): AB NEG

## 2017-10-07 LAB — PREPARE RBC (CROSSMATCH)

## 2017-10-07 LAB — SURGICAL PCR SCREEN
MRSA, PCR: NEGATIVE
Staphylococcus aureus: NEGATIVE

## 2017-10-07 NOTE — Progress Notes (Signed)
Patient dropped by the office with "Questions about his surgery" Just left PAT visit. Instructed he is to continue his aspirin and OK to get flu shot prior to surgery. Asked him to try and get it today or ASAP. Verbalized understanding .

## 2017-10-07 NOTE — Progress Notes (Signed)
PCP - Sinda Du Cardiologist - Ellen Henri, cardiac clearance note 09/03/17  EKG - 09/03/17 Stress Test - 06/06/16 ECHO - 06/11/16 Cardiac Cath - pt unsure if he has had this, states he has a couple blockages on each side of his heart?  Aspirin Instructions: Patients was not given instructions on stopping Aspirin, patient states he is not taking any more aspirin after today. Pt states he will drive to Dr. Lianne Moris office today on the way home and ask whether he needs to stop aspirin or keep taking this.   Pt also states he thought he was having surgery on both legs. Pt wants to speak to Dr. [name] prior to signing consent form. Pt will do this today.   Anesthesia review: chart sent to anesthesia to review cardiac history and tests, cardiac clearance note  Patient denies shortness of breath, fever, cough and chest pain at PAT appointment   Patient verbalized understanding of instructions that were given to them at the PAT appointment. Patient was also instructed that they will need to review over the PAT instructions again at home before surgery.

## 2017-10-08 NOTE — Progress Notes (Signed)
Anesthesia Chart Review: Patient is a 66 year old male scheduled for aorta bifemoral bypass graft, possible right femoral-popliteal bypass graft on 10/14/17 by Dr. Adele Barthel.  History includes recent former smoker (quit 08/31/17), HTN, RLE DVT/PE 02/05/16, CAD (coronary calcifications on chest CTA 01/2016), COPD, hypercholesterolemia, scoliosis, gout, skin cancer excision, C5-6 ACDF 10/05/10.  PCP is Dr. Sinda Du. Cardiologist is Dr. Dorris Carnes. Seen by Lyda Jester, PA-C on 09/03/17 for preoperative evaluation. She wrote,  "1. Pre-operative Risk Assessment: Pt with suspected CAD based on presence of coronary calcifications on CT scan and risk factors of tobacco use and PAD. However he had a NST 1 year ago in 2017 that was negative for ischemia or previous infarction. 2D echo 05/2016 showed normal LVEF, wall motion and no valvular abnormalities. He also lacks ischemic symptoms.  His Functional Capacity in METs is: 7.25 according to the Duke Activity Status Index (DASI). He denies any exertional CP or dyspnea with this level of METS. EKG shows NSR with RBBB, unchanged from baseline EKG. Other than decreased DPs bilaterally, his physical exam is benign. According to the Revised Cardiac Risk Index (RCRI), his Perioperative Risk of Major Cardiac Event is (%): 0.9   Based on assessment and ACC guidelines, there is no indication for further cardiac testing prior to surgery. He can be cleared for likely vascular surgery with Dr. [name]."  Meds include allopurinol, aspirin 81 mg, cod liver oil, enalapril-HCTZ, lovastatin, Percocet, vitamin E.  BP (!) 108/45   Pulse 86   Temp 36.7 C   Resp 18   Ht 6' (1.829 m)   Wt 184 lb 6.4 oz (83.6 kg)   SpO2 100%   BMI 25.01 kg/m   EKG 09/03/17: NSR, right BBB.  Nuclear stress test 06/06/16:  No diagnostic ST segment changes to indicate ischemia.  Small, mild intensity, inferoseptal defect extending from apex to base, overall fixed. There is a minor  degree of partial reversibility towards the apex. Suspect variable soft tissue attenuation versus a minor region of apical inferoseptal ischemia.  This is a low risk study.  Nuclear stress EF: 70%. (Dr. Harrington Challenger reviewed and wrote, "No significant ischemia on stess test Pumpting funciton is normal. I would keep on same medicines Stay active. Would set up for echo to evaluate pressures in lung with hsitory of PE.)  Echo 06/11/16: Study Conclusions - Left ventricle: The cavity size was normal. Wall thickness was   normal. Systolic function was normal. The estimated ejection   fraction was in the range of 60% to 65%. Wall motion was normal;   there were no regional wall motion abnormalities. Left   ventricular diastolic function parameters were normal. - Aortic valve: Mildly calcified annulus. Trileaflet; mildly   calcified leaflets. There was mild regurgitation.  Preoperative labs noted. Cr 1.49, stable when compared to labs since 04/2016. CBC, PT/PTT, UA WNL.   If no acute changes then I anticipate that he can proceed as planned.  George Hugh [facility] Short Stay Center/Anesthesiology Phone [phone] 10/08/2017 3:00 PM

## 2017-10-14 ENCOUNTER — Inpatient Hospital Stay (HOSPITAL_COMMUNITY): Payer: Medicare Other | Admitting: Certified Registered Nurse Anesthetist

## 2017-10-14 ENCOUNTER — Encounter (HOSPITAL_COMMUNITY): Payer: Self-pay | Admitting: Certified Registered Nurse Anesthetist

## 2017-10-14 ENCOUNTER — Inpatient Hospital Stay (HOSPITAL_COMMUNITY): Payer: Medicare Other

## 2017-10-14 ENCOUNTER — Inpatient Hospital Stay (HOSPITAL_COMMUNITY): Payer: Medicare Other | Admitting: Vascular Surgery

## 2017-10-14 ENCOUNTER — Inpatient Hospital Stay (HOSPITAL_COMMUNITY)
Admission: RE | Admit: 2017-10-14 | Discharge: 2017-10-22 | DRG: 270 | Disposition: A | Discharge status: Home Health | Payer: Medicare Other | Source: Ambulatory Visit | Attending: Vascular Surgery | Admitting: Vascular Surgery

## 2017-10-14 ENCOUNTER — Other Ambulatory Visit: Payer: Self-pay

## 2017-10-14 ENCOUNTER — Encounter (HOSPITAL_COMMUNITY)
Admission: RE | Disposition: A | Discharge status: Home Health | Payer: Self-pay | Source: Ambulatory Visit | Attending: Vascular Surgery

## 2017-10-14 DIAGNOSIS — K567 Ileus, unspecified: Secondary | ICD-10-CM

## 2017-10-14 DIAGNOSIS — Z85828 Personal history of other malignant neoplasm of skin: Secondary | ICD-10-CM | POA: Diagnosis not present

## 2017-10-14 DIAGNOSIS — D696 Thrombocytopenia, unspecified: Secondary | ICD-10-CM | POA: Diagnosis not present

## 2017-10-14 DIAGNOSIS — N179 Acute kidney failure, unspecified: Secondary | ICD-10-CM | POA: Diagnosis not present

## 2017-10-14 DIAGNOSIS — R579 Shock, unspecified: Secondary | ICD-10-CM | POA: Diagnosis not present

## 2017-10-14 DIAGNOSIS — K5900 Constipation, unspecified: Secondary | ICD-10-CM | POA: Diagnosis not present

## 2017-10-14 DIAGNOSIS — E7800 Pure hypercholesterolemia, unspecified: Secondary | ICD-10-CM | POA: Diagnosis present

## 2017-10-14 DIAGNOSIS — J9690 Respiratory failure, unspecified, unspecified whether with hypoxia or hypercapnia: Secondary | ICD-10-CM | POA: Diagnosis not present

## 2017-10-14 DIAGNOSIS — I10 Essential (primary) hypertension: Secondary | ICD-10-CM | POA: Diagnosis present

## 2017-10-14 DIAGNOSIS — Z452 Encounter for adjustment and management of vascular access device: Secondary | ICD-10-CM

## 2017-10-14 DIAGNOSIS — I9581 Postprocedural hypotension: Secondary | ICD-10-CM | POA: Diagnosis not present

## 2017-10-14 DIAGNOSIS — Z86711 Personal history of pulmonary embolism: Secondary | ICD-10-CM | POA: Diagnosis not present

## 2017-10-14 DIAGNOSIS — M419 Scoliosis, unspecified: Secondary | ICD-10-CM | POA: Diagnosis present

## 2017-10-14 DIAGNOSIS — Z09 Encounter for follow-up examination after completed treatment for conditions other than malignant neoplasm: Secondary | ICD-10-CM

## 2017-10-14 DIAGNOSIS — Z8249 Family history of ischemic heart disease and other diseases of the circulatory system: Secondary | ICD-10-CM | POA: Diagnosis not present

## 2017-10-14 DIAGNOSIS — G9340 Encephalopathy, unspecified: Secondary | ICD-10-CM | POA: Diagnosis not present

## 2017-10-14 DIAGNOSIS — I714 Abdominal aortic aneurysm, without rupture: Secondary | ICD-10-CM | POA: Diagnosis not present

## 2017-10-14 DIAGNOSIS — Z7982 Long term (current) use of aspirin: Secondary | ICD-10-CM

## 2017-10-14 DIAGNOSIS — D62 Acute posthemorrhagic anemia: Secondary | ICD-10-CM | POA: Diagnosis not present

## 2017-10-14 DIAGNOSIS — J9601 Acute respiratory failure with hypoxia: Secondary | ICD-10-CM | POA: Diagnosis not present

## 2017-10-14 DIAGNOSIS — I7409 Other arterial embolism and thrombosis of abdominal aorta: Secondary | ICD-10-CM | POA: Diagnosis not present

## 2017-10-14 DIAGNOSIS — R0600 Dyspnea, unspecified: Secondary | ICD-10-CM

## 2017-10-14 DIAGNOSIS — I998 Other disorder of circulatory system: Secondary | ICD-10-CM | POA: Diagnosis present

## 2017-10-14 DIAGNOSIS — J9811 Atelectasis: Secondary | ICD-10-CM | POA: Diagnosis not present

## 2017-10-14 DIAGNOSIS — J440 Chronic obstructive pulmonary disease with acute lower respiratory infection: Secondary | ICD-10-CM | POA: Diagnosis not present

## 2017-10-14 DIAGNOSIS — E876 Hypokalemia: Secondary | ICD-10-CM | POA: Diagnosis not present

## 2017-10-14 DIAGNOSIS — I2510 Atherosclerotic heart disease of native coronary artery without angina pectoris: Secondary | ICD-10-CM

## 2017-10-14 DIAGNOSIS — M79609 Pain in unspecified limb: Secondary | ICD-10-CM | POA: Diagnosis not present

## 2017-10-14 DIAGNOSIS — E875 Hyperkalemia: Secondary | ICD-10-CM | POA: Diagnosis not present

## 2017-10-14 DIAGNOSIS — E872 Acidosis: Secondary | ICD-10-CM | POA: Diagnosis not present

## 2017-10-14 DIAGNOSIS — R200 Anesthesia of skin: Secondary | ICD-10-CM | POA: Diagnosis not present

## 2017-10-14 DIAGNOSIS — Z9889 Other specified postprocedural states: Secondary | ICD-10-CM | POA: Diagnosis not present

## 2017-10-14 DIAGNOSIS — Z01818 Encounter for other preprocedural examination: Secondary | ICD-10-CM

## 2017-10-14 DIAGNOSIS — I739 Peripheral vascular disease, unspecified: Secondary | ICD-10-CM | POA: Diagnosis present

## 2017-10-14 DIAGNOSIS — Z4682 Encounter for fitting and adjustment of non-vascular catheter: Secondary | ICD-10-CM | POA: Diagnosis not present

## 2017-10-14 DIAGNOSIS — Z951 Presence of aortocoronary bypass graft: Secondary | ICD-10-CM | POA: Diagnosis not present

## 2017-10-14 DIAGNOSIS — F17210 Nicotine dependence, cigarettes, uncomplicated: Secondary | ICD-10-CM | POA: Diagnosis present

## 2017-10-14 DIAGNOSIS — J810 Acute pulmonary edema: Secondary | ICD-10-CM | POA: Diagnosis not present

## 2017-10-14 DIAGNOSIS — R0602 Shortness of breath: Secondary | ICD-10-CM | POA: Diagnosis not present

## 2017-10-14 DIAGNOSIS — R570 Cardiogenic shock: Secondary | ICD-10-CM | POA: Diagnosis not present

## 2017-10-14 DIAGNOSIS — I2699 Other pulmonary embolism without acute cor pulmonale: Secondary | ICD-10-CM | POA: Diagnosis not present

## 2017-10-14 DIAGNOSIS — I708 Atherosclerosis of other arteries: Secondary | ICD-10-CM | POA: Diagnosis present

## 2017-10-14 HISTORY — PX: INTRAOPERATIVE ARTERIOGRAM: SHX5157

## 2017-10-14 HISTORY — PX: FEMORAL-POPLITEAL BYPASS GRAFT: SHX937

## 2017-10-14 HISTORY — PX: AORTA - BILATERAL FEMORAL ARTERY BYPASS GRAFT: SHX1175

## 2017-10-14 HISTORY — PX: THROMBECTOMY FEMORAL ARTERY: SHX6406

## 2017-10-14 LAB — PROTIME-INR
INR: 1.37
Prothrombin Time: 16.7 seconds — ABNORMAL HIGH (ref 11.4–15.2)

## 2017-10-14 LAB — BASIC METABOLIC PANEL
Anion gap: 6 (ref 5–15)
BUN: 23 mg/dL — ABNORMAL HIGH (ref 6–20)
CO2: 19 mmol/L — ABNORMAL LOW (ref 22–32)
Calcium: 9.7 mg/dL (ref 8.9–10.3)
Chloride: 111 mmol/L (ref 101–111)
Creatinine, Ser: 1.79 mg/dL — ABNORMAL HIGH (ref 0.61–1.24)
GFR calc Af Amer: 44 mL/min — ABNORMAL LOW (ref >60)
GFR calc non Af Amer: 38 mL/min — ABNORMAL LOW (ref >60)
Glucose, Bld: 201 mg/dL — ABNORMAL HIGH (ref 65–99)
Potassium: 5.8 mmol/L — ABNORMAL HIGH (ref 3.5–5.1)
Sodium: 136 mmol/L (ref 135–145)

## 2017-10-14 LAB — TROPONIN I: Troponin I: <0.03 ng/mL (ref <0.03)

## 2017-10-14 LAB — CBC
HCT: 29 % — ABNORMAL LOW (ref 39.0–52.0)
Hemoglobin: 9.9 g/dL — ABNORMAL LOW (ref 13.0–17.0)
MCH: 31 pg (ref 26.0–34.0)
MCHC: 34.1 g/dL (ref 30.0–36.0)
MCV: 90.9 fL (ref 78.0–100.0)
Platelets: 173 10*3/uL (ref 150–400)
RBC: 3.19 MIL/uL — ABNORMAL LOW (ref 4.22–5.81)
RDW: 13 % (ref 11.5–15.5)
WBC: 16.4 10*3/uL — ABNORMAL HIGH (ref 4.0–10.5)

## 2017-10-14 LAB — GLUCOSE, CAPILLARY
Glucose-Capillary: 180 mg/dL — ABNORMAL HIGH (ref 65–99)
Glucose-Capillary: 199 mg/dL — ABNORMAL HIGH (ref 65–99)
Glucose-Capillary: 201 mg/dL — ABNORMAL HIGH (ref 65–99)

## 2017-10-14 LAB — LACTIC ACID, PLASMA: Lactic Acid, Venous: 2.6 mmol/L (ref 0.5–1.9)

## 2017-10-14 LAB — POCT I-STAT 3, ART BLOOD GAS (G3+)
Acid-base deficit: 3 mmol/L — ABNORMAL HIGH (ref 0.0–2.0)
Bicarbonate: 23.2 mmol/L (ref 20.0–28.0)
O2 Saturation: 100 %
Patient temperature: 98.1
TCO2: 25 mmol/L (ref 22–32)
pCO2 arterial: 46.2 mmHg (ref 32.0–48.0)
pH, Arterial: 7.308 — ABNORMAL LOW (ref 7.350–7.450)
pO2, Arterial: 526 mmHg — ABNORMAL HIGH (ref 83.0–108.0)

## 2017-10-14 LAB — ECHOCARDIOGRAM COMPLETE
Height: 72 in
Weight: 2944 oz

## 2017-10-14 LAB — PREPARE RBC (CROSSMATCH)

## 2017-10-14 LAB — APTT: aPTT: 74 seconds — ABNORMAL HIGH (ref 24–36)

## 2017-10-14 LAB — MAGNESIUM: Magnesium: 1 mg/dL — ABNORMAL LOW (ref 1.7–2.4)

## 2017-10-14 SURGERY — CREATION, BYPASS, ARTERIAL, AORTA TO FEMORAL, BILATERAL, USING GRAFT
Anesthesia: General | Site: Leg Upper | Laterality: Right

## 2017-10-14 MED ORDER — ACETAMINOPHEN 325 MG RE SUPP
325.0000 mg | RECTAL | Status: DC | PRN
  Filled 2017-10-14: qty 2

## 2017-10-14 MED ORDER — FENTANYL CITRATE (PF) 250 MCG/5ML IJ SOLN
INTRAMUSCULAR | Status: AC
  Filled 2017-10-14: qty 5

## 2017-10-14 MED ORDER — FUROSEMIDE 10 MG/ML IJ SOLN
INTRAMUSCULAR | Status: DC | PRN
Start: 2017-10-14 — End: 2017-10-14
  Administered 2017-10-14: 5 mg via INTRAMUSCULAR

## 2017-10-14 MED ORDER — PHENOL 1.4 % MT LIQD
1.0000 | OROMUCOSAL | Status: DC | PRN
  Administered 2017-10-17: 1 via OROMUCOSAL
  Filled 2017-10-14: qty 177

## 2017-10-14 MED ORDER — MIDAZOLAM HCL 2 MG/2ML IJ SOLN
INTRAMUSCULAR | Status: AC
  Filled 2017-10-14: qty 2

## 2017-10-14 MED ORDER — DEXMEDETOMIDINE HCL IN NACL 200 MCG/50ML IV SOLN
INTRAVENOUS | Status: DC | PRN
  Administered 2017-10-14: .5 ug/kg/h via INTRAVENOUS

## 2017-10-14 MED ORDER — EPHEDRINE SULFATE 50 MG/ML IJ SOLN
INTRAMUSCULAR | Status: DC | PRN
  Administered 2017-10-14: 10 mg via INTRAVENOUS
  Administered 2017-10-14: 40 mg via INTRAVENOUS
  Administered 2017-10-14 (×2): 10 mg via INTRAVENOUS
  Administered 2017-10-14: 5 mg via INTRAVENOUS

## 2017-10-14 MED ORDER — HYDRALAZINE HCL 20 MG/ML IJ SOLN: 5.0000 mg | INTRAMUSCULAR | Status: DC | PRN

## 2017-10-14 MED ORDER — INSULIN ASPART 100 UNIT/ML ~~LOC~~ SOLN
0.0000 [IU] | SUBCUTANEOUS | Status: DC
  Administered 2017-10-14: 2 [IU] via SUBCUTANEOUS
  Administered 2017-10-14: 3 [IU] via SUBCUTANEOUS
  Administered 2017-10-15 – 2017-10-20 (×2): 1 [IU] via SUBCUTANEOUS

## 2017-10-14 MED ORDER — PANTOPRAZOLE SODIUM 40 MG PO TBEC: 40.0000 mg | DELAYED_RELEASE_TABLET | Freq: Every day | ORAL | Status: DC

## 2017-10-14 MED ORDER — ALUM & MAG HYDROXIDE-SIMETH 200-200-20 MG/5ML PO SUSP: 15.0000 mL | ORAL | Status: DC | PRN

## 2017-10-14 MED ORDER — VITAMIN E 45 MG (100 UNIT) PO CAPS
100.0000 [IU] | ORAL_CAPSULE | Freq: Every day | ORAL | Status: DC
  Filled 2017-10-14 (×10): qty 1

## 2017-10-14 MED ORDER — ASPIRIN EC 81 MG PO TBEC
81.0000 mg | DELAYED_RELEASE_TABLET | Freq: Every day | ORAL | Status: DC
  Administered 2017-10-15 – 2017-10-22 (×8): 81 mg via ORAL
  Filled 2017-10-14 (×8): qty 1

## 2017-10-14 MED ORDER — POTASSIUM CHLORIDE CRYS ER 20 MEQ PO TBCR
20.0000 meq | EXTENDED_RELEASE_TABLET | Freq: Every day | ORAL | Status: AC | PRN
  Administered 2017-10-18: 40 meq via ORAL
  Filled 2017-10-14: qty 2

## 2017-10-14 MED ORDER — ORAL CARE MOUTH RINSE
15.0000 mL | OROMUCOSAL | Status: DC
  Administered 2017-10-14 – 2017-10-15 (×6): 15 mL via OROMUCOSAL

## 2017-10-14 MED ORDER — MIDAZOLAM HCL 50 MG/10ML IJ SOLN
0.0000 mg/h | INTRAMUSCULAR | Status: DC
  Administered 2017-10-14: 1 mg/h via INTRAVENOUS
  Administered 2017-10-15: 2 mg/h via INTRAVENOUS
  Administered 2017-10-15: 5 mg/h via INTRAVENOUS
  Administered 2017-10-15: 4 mg/h via INTRAVENOUS
  Filled 2017-10-14 (×2): qty 10

## 2017-10-14 MED ORDER — SODIUM CHLORIDE 0.9% FLUSH
10.0000 mL | INTRAVENOUS | Status: DC | PRN
  Administered 2017-10-21 (×2): 10 mL
  Filled 2017-10-14 (×2): qty 40

## 2017-10-14 MED ORDER — ONDANSETRON HCL 4 MG/2ML IJ SOLN
INTRAMUSCULAR | Status: DC | PRN
  Administered 2017-10-14: 4 mg via INTRAVENOUS

## 2017-10-14 MED ORDER — MANNITOL 25 % IV SOLN
INTRAVENOUS | Status: DC | PRN
  Administered 2017-10-14: 12.5 g via INTRAVENOUS

## 2017-10-14 MED ORDER — HEPARIN SODIUM (PORCINE) 1000 UNIT/ML IJ SOLN
INTRAMUSCULAR | Status: AC
  Filled 2017-10-14: qty 1

## 2017-10-14 MED ORDER — COENZYME Q10 30 MG PO CAPS: 30.0000 mg | ORAL_CAPSULE | Freq: Every day | ORAL | Status: DC

## 2017-10-14 MED ORDER — SODIUM CHLORIDE 0.9 % IV SOLN
INTRAVENOUS | Status: DC
  Administered 2017-10-14 (×2): via INTRAVENOUS

## 2017-10-14 MED ORDER — MAGNESIUM SULFATE 2 GM/50ML IV SOLN
2.0000 g | Freq: Once | INTRAVENOUS | Status: AC
  Administered 2017-10-14: 2 g via INTRAVENOUS
  Filled 2017-10-14: qty 50

## 2017-10-14 MED ORDER — GUAIFENESIN-DM 100-10 MG/5ML PO SYRP: 15.0000 mL | ORAL_SOLUTION | ORAL | Status: DC | PRN

## 2017-10-14 MED ORDER — FENTANYL CITRATE (PF) 100 MCG/2ML IJ SOLN
INTRAMUSCULAR | Status: AC
  Administered 2017-10-14: 100 ug via INTRAVENOUS
  Filled 2017-10-14: qty 2

## 2017-10-14 MED ORDER — MORPHINE SULFATE (PF) 4 MG/ML IV SOLN
4.0000 mg | INTRAVENOUS | Status: DC | PRN
  Administered 2017-10-15: 2 mg via INTRAVENOUS
  Filled 2017-10-14: qty 1

## 2017-10-14 MED ORDER — VANCOMYCIN HCL IN DEXTROSE 1-5 GM/200ML-% IV SOLN
1000.0000 mg | INTRAVENOUS | Status: AC
  Administered 2017-10-14: 1000 mg via INTRAVENOUS
  Filled 2017-10-14: qty 200

## 2017-10-14 MED ORDER — ROCURONIUM BROMIDE 100 MG/10ML IV SOLN
INTRAVENOUS | Status: DC | PRN
  Administered 2017-10-14: 30 mg via INTRAVENOUS
  Administered 2017-10-14: 50 mg via INTRAVENOUS
  Administered 2017-10-14: 30 mg via INTRAVENOUS
  Administered 2017-10-14: 20 mg via INTRAVENOUS
  Administered 2017-10-14: 30 mg via INTRAVENOUS
  Administered 2017-10-14 (×2): 20 mg via INTRAVENOUS
  Administered 2017-10-14 (×2): 30 mg via INTRAVENOUS

## 2017-10-14 MED ORDER — DOCUSATE SODIUM 100 MG PO CAPS: 100.0000 mg | ORAL_CAPSULE | Freq: Every day | ORAL | Status: DC

## 2017-10-14 MED ORDER — VASOPRESSIN 20 UNIT/ML IV SOLN
INTRAVENOUS | Status: AC
  Filled 2017-10-14: qty 1

## 2017-10-14 MED ORDER — DEXMEDETOMIDINE HCL IN NACL 200 MCG/50ML IV SOLN
INTRAVENOUS | Status: AC
  Filled 2017-10-14: qty 50

## 2017-10-14 MED ORDER — HYDROCHLOROTHIAZIDE 25 MG PO TABS
25.0000 mg | ORAL_TABLET | Freq: Every day | ORAL | Status: DC
  Filled 2017-10-14: qty 1

## 2017-10-14 MED ORDER — LACTATED RINGERS IV SOLN
INTRAVENOUS | Status: DC | PRN
  Administered 2017-10-14 (×4): via INTRAVENOUS

## 2017-10-14 MED ORDER — GLYCOPYRROLATE 0.2 MG/ML IJ SOLN
INTRAMUSCULAR | Status: DC | PRN
Start: 2017-10-14 — End: 2017-10-14
  Administered 2017-10-14: .2 mg via INTRAVENOUS

## 2017-10-14 MED ORDER — ENALAPRIL-HYDROCHLOROTHIAZIDE 10-25 MG PO TABS: 1.0000 | ORAL_TABLET | Freq: Every day | ORAL | Status: DC

## 2017-10-14 MED ORDER — PERFLUTREN LIPID MICROSPHERE
INTRAVENOUS | Status: AC
  Administered 2017-10-14: 2 mL
  Filled 2017-10-14: qty 10

## 2017-10-14 MED ORDER — MAGNESIUM SULFATE 2 GM/50ML IV SOLN
2.0000 g | Freq: Every day | INTRAVENOUS | Status: DC | PRN
  Filled 2017-10-14: qty 50

## 2017-10-14 MED ORDER — VASOPRESSIN 20 UNIT/ML IV SOLN
0.2000 [IU]/min | INTRAVENOUS | Status: DC
  Administered 2017-10-14: .05 [IU]/min via INTRAVENOUS
  Filled 2017-10-14: qty 5

## 2017-10-14 MED ORDER — VASOPRESSIN 20 UNIT/ML IV SOLN
0.0300 [IU]/min | INTRAVENOUS | Status: DC
  Filled 2017-10-14: qty 2

## 2017-10-14 MED ORDER — FENTANYL CITRATE (PF) 100 MCG/2ML IJ SOLN
100.0000 ug | Freq: Once | INTRAMUSCULAR | Status: AC
  Administered 2017-10-14: 100 ug via INTRAVENOUS

## 2017-10-14 MED ORDER — ONDANSETRON HCL 4 MG/2ML IJ SOLN: INTRAMUSCULAR | Status: DC | PRN

## 2017-10-14 MED ORDER — PROPOFOL 10 MG/ML IV BOLUS
INTRAVENOUS | Status: AC
  Filled 2017-10-14: qty 20

## 2017-10-14 MED ORDER — SODIUM CHLORIDE 0.9 % IV SOLN: 0.4000 ug/kg/h | INTRAVENOUS | Status: DC

## 2017-10-14 MED ORDER — ACETAMINOPHEN 325 MG PO TABS
325.0000 mg | ORAL_TABLET | ORAL | Status: DC | PRN
  Administered 2017-10-19: 650 mg via ORAL
  Filled 2017-10-14: qty 2

## 2017-10-14 MED ORDER — METOPROLOL TARTRATE 5 MG/5ML IV SOLN: 2.0000 mg | INTRAVENOUS | Status: DC | PRN

## 2017-10-14 MED ORDER — PHENYLEPHRINE HCL 10 MG/ML IJ SOLN
INTRAMUSCULAR | Status: DC | PRN
  Administered 2017-10-14: 60 ug/min via INTRAVENOUS
  Administered 2017-10-14: 16:00:00 via INTRAVENOUS

## 2017-10-14 MED ORDER — HEPARIN SODIUM (PORCINE) 5000 UNIT/ML IJ SOLN
5000.0000 [IU] | Freq: Three times a day (TID) | INTRAMUSCULAR | Status: DC
  Administered 2017-10-15 – 2017-10-22 (×21): 5000 [IU] via SUBCUTANEOUS
  Filled 2017-10-14 (×22): qty 1

## 2017-10-14 MED ORDER — LIDOCAINE HCL (CARDIAC) 20 MG/ML IV SOLN: INTRAVENOUS | Status: DC | PRN

## 2017-10-14 MED ORDER — PROPOFOL 10 MG/ML IV BOLUS
INTRAVENOUS | Status: DC | PRN
  Administered 2017-10-14: 30 mg via INTRAVENOUS
  Administered 2017-10-14: 130 mg via INTRAVENOUS

## 2017-10-14 MED ORDER — CALCIUM CHLORIDE 10 % IV SOLN
INTRAVENOUS | Status: DC | PRN
  Administered 2017-10-14: 500 mg via INTRAVENOUS
  Administered 2017-10-14: 300 mg via INTRAVENOUS
  Administered 2017-10-14: 200 mg via INTRAVENOUS
  Administered 2017-10-14 (×2): 300 mg via INTRAVENOUS
  Administered 2017-10-14 (×2): 200 mg via INTRAVENOUS

## 2017-10-14 MED ORDER — SODIUM CHLORIDE 0.9 % IV SOLN
INTRAVENOUS | Status: DC
  Administered 2017-10-14 – 2017-10-15 (×2): via INTRAVENOUS

## 2017-10-14 MED ORDER — CHLORHEXIDINE GLUCONATE CLOTH 2 % EX PADS
6.0000 | MEDICATED_PAD | Freq: Every day | CUTANEOUS | Status: DC
  Administered 2017-10-14 – 2017-10-19 (×6): 6 via TOPICAL

## 2017-10-14 MED ORDER — IODIXANOL 320 MG/ML IV SOLN
INTRAVENOUS | Status: DC | PRN
  Administered 2017-10-14: 40 mL via INTRAVENOUS

## 2017-10-14 MED ORDER — PANTOPRAZOLE SODIUM 40 MG IV SOLR
40.0000 mg | Freq: Two times a day (BID) | INTRAVENOUS | Status: DC
  Administered 2017-10-14 – 2017-10-17 (×7): 40 mg via INTRAVENOUS
  Filled 2017-10-14 (×7): qty 40

## 2017-10-14 MED ORDER — SODIUM CHLORIDE 0.9 % IV SOLN
1.0000 g | Freq: Once | INTRAVENOUS | Status: AC
  Administered 2017-10-14: 1 g via INTRAVENOUS
  Filled 2017-10-14: qty 10

## 2017-10-14 MED ORDER — SODIUM CHLORIDE 0.9 % IV SOLN
INTRAVENOUS | Status: DC | PRN
  Administered 2017-10-14: 10:00:00 500 mL

## 2017-10-14 MED ORDER — LABETALOL HCL 5 MG/ML IV SOLN: 10.0000 mg | INTRAVENOUS | Status: DC | PRN

## 2017-10-14 MED ORDER — FENTANYL CITRATE (PF) 100 MCG/2ML IJ SOLN
INTRAMUSCULAR | Status: DC | PRN
  Administered 2017-10-14: 100 ug via INTRAVENOUS
  Administered 2017-10-14 (×3): 50 ug via INTRAVENOUS
  Administered 2017-10-14: 150 ug via INTRAVENOUS
  Administered 2017-10-14 (×2): 50 ug via INTRAVENOUS
  Administered 2017-10-14: 100 ug via INTRAVENOUS
  Administered 2017-10-14 (×5): 50 ug via INTRAVENOUS
  Administered 2017-10-14: 100 ug via INTRAVENOUS
  Administered 2017-10-14: 50 ug via INTRAVENOUS

## 2017-10-14 MED ORDER — SODIUM CHLORIDE 0.9% FLUSH
10.0000 mL | Freq: Two times a day (BID) | INTRAVENOUS | Status: DC
  Administered 2017-10-15 – 2017-10-19 (×7): 10 mL

## 2017-10-14 MED ORDER — HEPARIN SODIUM (PORCINE) 1000 UNIT/ML IJ SOLN
INTRAMUSCULAR | Status: DC | PRN
  Administered 2017-10-14 (×2): 2000 [IU] via INTRAVENOUS
  Administered 2017-10-14: 5000 [IU] via INTRAVENOUS
  Administered 2017-10-14: 9000 [IU] via INTRAVENOUS
  Administered 2017-10-14: 2000 [IU] via INTRAVENOUS

## 2017-10-14 MED ORDER — LACTATED RINGERS IV SOLN
INTRAVENOUS | Status: DC | PRN
  Administered 2017-10-14: 09:00:00 via INTRAVENOUS

## 2017-10-14 MED ORDER — ALLOPURINOL 300 MG PO TABS
300.0000 mg | ORAL_TABLET | ORAL | Status: DC
  Administered 2017-10-16 – 2017-10-20 (×3): 300 mg via ORAL
  Filled 2017-10-14 (×6): qty 1

## 2017-10-14 MED ORDER — MIDAZOLAM HCL 2 MG/2ML IJ SOLN
INTRAMUSCULAR | Status: AC
  Administered 2017-10-14: 1.5 mg via INTRAVENOUS
  Filled 2017-10-14: qty 2

## 2017-10-14 MED ORDER — HEMOSTATIC AGENTS (NO CHARGE) OPTIME
TOPICAL | Status: DC | PRN
  Administered 2017-10-14 (×3): 1 via TOPICAL

## 2017-10-14 MED ORDER — OXYCODONE-ACETAMINOPHEN 5-325 MG PO TABS
1.0000 | ORAL_TABLET | ORAL | Status: DC | PRN
  Administered 2017-10-19: 2 via ORAL
  Administered 2017-10-19: 1 via ORAL
  Administered 2017-10-20: 2 via ORAL
  Administered 2017-10-20: 1 via ORAL
  Administered 2017-10-20: 2 via ORAL
  Administered 2017-10-21 – 2017-10-22 (×4): 1 via ORAL
  Filled 2017-10-14 (×2): qty 1
  Filled 2017-10-14: qty 2
  Filled 2017-10-14: qty 1
  Filled 2017-10-14: qty 2
  Filled 2017-10-14 (×2): qty 1
  Filled 2017-10-14 (×2): qty 2

## 2017-10-14 MED ORDER — MIDAZOLAM HCL 2 MG/2ML IJ SOLN
1.5000 mg | Freq: Once | INTRAMUSCULAR | Status: AC
  Administered 2017-10-14: 1.5 mg via INTRAVENOUS

## 2017-10-14 MED ORDER — ALBUMIN HUMAN 5 % IV SOLN
INTRAVENOUS | Status: DC | PRN
  Administered 2017-10-14 (×5): via INTRAVENOUS

## 2017-10-14 MED ORDER — CHLORHEXIDINE GLUCONATE 0.12% ORAL RINSE (MEDLINE KIT)
15.0000 mL | Freq: Two times a day (BID) | OROMUCOSAL | Status: DC
  Administered 2017-10-14 – 2017-10-15 (×2): 15 mL via OROMUCOSAL

## 2017-10-14 MED ORDER — PHENYLEPHRINE HCL 10 MG/ML IJ SOLN
INTRAMUSCULAR | Status: DC | PRN
  Administered 2017-10-14: 160 ug via INTRAVENOUS
  Administered 2017-10-14: 240 ug via INTRAVENOUS
  Administered 2017-10-14: 40 ug via INTRAVENOUS
  Administered 2017-10-14 (×2): 80 ug via INTRAVENOUS
  Administered 2017-10-14: 40 ug via INTRAVENOUS
  Administered 2017-10-14 (×2): 80 ug via INTRAVENOUS

## 2017-10-14 MED ORDER — 0.9 % SODIUM CHLORIDE (POUR BTL) OPTIME
TOPICAL | Status: DC | PRN
  Administered 2017-10-14 (×2): 1000 mL
  Administered 2017-10-14: 3000 mL

## 2017-10-14 MED ORDER — HEPARIN SODIUM (PORCINE) 1000 UNIT/ML IJ SOLN
INTRAMUSCULAR | Status: AC
  Filled 2017-10-14: qty 3

## 2017-10-14 MED ORDER — VASOPRESSIN 20 UNIT/ML IV SOLN
INTRAVENOUS | Status: DC | PRN
  Administered 2017-10-14 (×2): 2 [IU] via INTRAVENOUS
  Administered 2017-10-14: 3 [IU] via INTRAVENOUS
  Administered 2017-10-14 (×2): 1 [IU] via INTRAVENOUS
  Administered 2017-10-14: 3 [IU] via INTRAVENOUS
  Administered 2017-10-14 (×4): 2 [IU] via INTRAVENOUS
  Administered 2017-10-14: 3 [IU] via INTRAVENOUS
  Administered 2017-10-14: 2 [IU] via INTRAVENOUS

## 2017-10-14 MED ORDER — LACTATED RINGERS IV SOLN
INTRAVENOUS | Status: DC
  Administered 2017-10-14 (×2): via INTRAVENOUS

## 2017-10-14 MED ORDER — SODIUM CHLORIDE 0.9 % IV SOLN
0.0000 ug/min | INTRAVENOUS | Status: DC
  Administered 2017-10-14: 70 ug/min via INTRAVENOUS
  Administered 2017-10-14: 35 ug/min via INTRAVENOUS
  Filled 2017-10-14 (×2): qty 1

## 2017-10-14 MED ORDER — SODIUM POLYSTYRENE SULFONATE 15 GM/60ML PO SUSP
60.0000 g | Freq: Once | ORAL | Status: AC
  Administered 2017-10-14: 60 g
  Filled 2017-10-14: qty 240

## 2017-10-14 MED ORDER — MIDAZOLAM BOLUS VIA INFUSION
1.0000 mg | INTRAVENOUS | Status: DC | PRN
  Administered 2017-10-14: 2 mg via INTRAVENOUS
  Administered 2017-10-15 (×2): 1 mg via INTRAVENOUS
  Filled 2017-10-14: qty 2

## 2017-10-14 MED ORDER — SODIUM CHLORIDE 0.9 % IJ SOLN
INTRAMUSCULAR | Status: AC
  Filled 2017-10-14: qty 20

## 2017-10-14 MED ORDER — CHLORHEXIDINE GLUCONATE 4 % EX LIQD: 60.0000 mL | Freq: Once | CUTANEOUS | Status: DC

## 2017-10-14 MED ORDER — CLINDAMYCIN PHOSPHATE 300 MG/50ML IV SOLN
300.0000 mg | Freq: Three times a day (TID) | INTRAVENOUS | Status: AC
  Administered 2017-10-14 – 2017-10-15 (×3): 300 mg via INTRAVENOUS
  Filled 2017-10-14 (×4): qty 50

## 2017-10-14 MED ORDER — DEXAMETHASONE SODIUM PHOSPHATE 10 MG/ML IJ SOLN
INTRAMUSCULAR | Status: DC | PRN
  Administered 2017-10-14: 5 mg via INTRAVENOUS

## 2017-10-14 MED ORDER — PRAVASTATIN SODIUM 40 MG PO TABS
40.0000 mg | ORAL_TABLET | Freq: Every day | ORAL | Status: DC
  Administered 2017-10-16 – 2017-10-21 (×6): 40 mg via ORAL
  Filled 2017-10-14 (×6): qty 1

## 2017-10-14 MED ORDER — MIDAZOLAM HCL 5 MG/5ML IJ SOLN
INTRAMUSCULAR | Status: DC | PRN
  Administered 2017-10-14: 2 mg via INTRAVENOUS

## 2017-10-14 MED ORDER — SODIUM CHLORIDE 0.9 % IV SOLN: 500.0000 mL | Freq: Once | INTRAVENOUS | Status: DC | PRN

## 2017-10-14 MED ORDER — CHLORHEXIDINE GLUCONATE 4 % EX LIQD
60.0000 mL | Freq: Once | CUTANEOUS | Status: DC
  Administered 2017-10-14: 4 via TOPICAL

## 2017-10-14 MED ORDER — SODIUM BICARBONATE 8.4 % IV SOLN
INTRAVENOUS | Status: DC | PRN
  Administered 2017-10-14: 50 mL via INTRAVENOUS

## 2017-10-14 MED ORDER — SODIUM CHLORIDE 0.9 % IV SOLN
Freq: Once | INTRAVENOUS | Status: AC
  Administered 2017-10-14: 20:00:00 via INTRAVENOUS

## 2017-10-14 MED ORDER — ENALAPRIL MALEATE 10 MG PO TABS
10.0000 mg | ORAL_TABLET | Freq: Every day | ORAL | Status: DC
  Filled 2017-10-14 (×2): qty 1

## 2017-10-14 MED ORDER — ONDANSETRON HCL 4 MG/2ML IJ SOLN
4.0000 mg | Freq: Four times a day (QID) | INTRAMUSCULAR | Status: DC | PRN
  Administered 2017-10-17 – 2017-10-18 (×2): 4 mg via INTRAVENOUS
  Filled 2017-10-14 (×2): qty 2

## 2017-10-14 SURGICAL SUPPLY — 113 items
ADH SKN CLS APL DERMABOND .7 (GAUZE/BANDAGES/DRESSINGS) ×3
ADH SKN CLS LQ APL DERMABOND (GAUZE/BANDAGES/DRESSINGS) ×9
AGENT HMST SPONGE THK3/8 (HEMOSTASIS) ×9
BAG ISL DRAPE 18X18 STRL (DRAPES) ×6
BAG ISOLATION DRAPE 18X18 (DRAPES) IMPLANT
BANDAGE ESMARK 6X9 LF (GAUZE/BANDAGES/DRESSINGS) IMPLANT
BNDG CMPR 9X6 STRL LF SNTH (GAUZE/BANDAGES/DRESSINGS)
BNDG ESMARK 6X9 LF (GAUZE/BANDAGES/DRESSINGS)
CANISTER SUCT 3000ML PPV (MISCELLANEOUS) ×4 IMPLANT
CATH EMB 3FR 80CM (CATHETERS) ×1 IMPLANT
CLIP VESOCCLUDE MED 24/CT (CLIP) ×4 IMPLANT
CLIP VESOCCLUDE SM WIDE 24/CT (CLIP) ×5 IMPLANT
COVER PROBE W GEL 5X96 (DRAPES) ×4 IMPLANT
CUFF TOURNIQUET SINGLE 24IN (TOURNIQUET CUFF) IMPLANT
CUFF TOURNIQUET SINGLE 34IN LL (TOURNIQUET CUFF) IMPLANT
CUFF TOURNIQUET SINGLE 44IN (TOURNIQUET CUFF) IMPLANT
DERMABOND ADHESIVE PROPEN (GAUZE/BANDAGES/DRESSINGS) ×3
DERMABOND ADVANCED (GAUZE/BANDAGES/DRESSINGS) ×1
DERMABOND ADVANCED .7 DNX12 (GAUZE/BANDAGES/DRESSINGS) ×3 IMPLANT
DERMABOND ADVANCED .7 DNX6 (GAUZE/BANDAGES/DRESSINGS) IMPLANT
DRAIN CHANNEL 15F RND FF W/TCR (WOUND CARE) IMPLANT
DRAPE C-ARM 42X72 X-RAY (DRAPES) ×3 IMPLANT
DRAPE HALF SHEET 40X57 (DRAPES) ×1 IMPLANT
DRAPE ISOLATION BAG 18X18 (DRAPES) ×2
DRSG COVADERM 4X14 (GAUZE/BANDAGES/DRESSINGS) ×1 IMPLANT
DRSG COVADERM 4X6 (GAUZE/BANDAGES/DRESSINGS) ×2 IMPLANT
DRSG COVADERM 4X8 (GAUZE/BANDAGES/DRESSINGS) ×1 IMPLANT
ELECT BLADE 4.0 EZ CLEAN MEGAD (MISCELLANEOUS) ×4
ELECT BLADE 6.5 EXT (BLADE) ×1 IMPLANT
ELECT CAUTERY BLADE 6.4 (BLADE) ×1 IMPLANT
ELECT REM PT RETURN 9FT ADLT (ELECTROSURGICAL) ×4
ELECTRODE BLDE 4.0 EZ CLN MEGD (MISCELLANEOUS) IMPLANT
ELECTRODE REM PT RTRN 9FT ADLT (ELECTROSURGICAL) ×3 IMPLANT
EVACUATOR SILICONE 100CC (DRAIN) IMPLANT
FELT TEFLON 1X6 (MISCELLANEOUS) ×1 IMPLANT
GAUZE SPONGE 4X4 16PLY XRAY LF (GAUZE/BANDAGES/DRESSINGS) ×1 IMPLANT
GLOVE BIO SURGEON STRL SZ 6.5 (GLOVE) ×1 IMPLANT
GLOVE BIO SURGEON STRL SZ7 (GLOVE) ×4 IMPLANT
GLOVE BIO SURGEON STRL SZ7.5 (GLOVE) ×2 IMPLANT
GLOVE BIOGEL PI IND STRL 6.5 (GLOVE) IMPLANT
GLOVE BIOGEL PI IND STRL 7.5 (GLOVE) ×3 IMPLANT
GLOVE BIOGEL PI IND STRL 8 (GLOVE) IMPLANT
GLOVE BIOGEL PI IND STRL 8.5 (GLOVE) IMPLANT
GLOVE BIOGEL PI INDICATOR 6.5 (GLOVE) ×2
GLOVE BIOGEL PI INDICATOR 7.5 (GLOVE) ×1
GLOVE BIOGEL PI INDICATOR 8 (GLOVE) ×1
GLOVE BIOGEL PI INDICATOR 8.5 (GLOVE) ×2
GLOVE SKINSENSE NS SZ6.5 (GLOVE) ×1
GLOVE SKINSENSE NS SZ7.0 (GLOVE) ×2
GLOVE SKINSENSE STRL SZ6.5 (GLOVE) IMPLANT
GLOVE SKINSENSE STRL SZ7.0 (GLOVE) IMPLANT
GLOVE SS BIOGEL STRL SZ 8 (GLOVE) IMPLANT
GLOVE SUPERSENSE BIOGEL SZ 8 (GLOVE) ×2
GLOVE SURG SS PI 7.0 STRL IVOR (GLOVE) ×1 IMPLANT
GOWN STRL REUS W/ TWL LRG LVL3 (GOWN DISPOSABLE) ×9 IMPLANT
GOWN STRL REUS W/TWL LRG LVL3 (GOWN DISPOSABLE) ×16
GOWN STRL REUS W/TWL XL LVL3 (GOWN DISPOSABLE) ×6 IMPLANT
GRAFT HEMASHIELD 14X8MM (Vascular Products) ×1 IMPLANT
GRAFT PROPATEN THIN WALL 6X80 (Vascular Products) ×1 IMPLANT
HEMOSTAT SPONGE AVITENE ULTRA (HEMOSTASIS) ×3 IMPLANT
INSERT FOGARTY 61MM (MISCELLANEOUS) ×5 IMPLANT
INSERT FOGARTY SM (MISCELLANEOUS) ×10 IMPLANT
KIT BASIN OR (CUSTOM PROCEDURE TRAY) ×4 IMPLANT
KIT ROOM TURNOVER OR (KITS) ×4 IMPLANT
LOOP VESSEL MAXI BLUE (MISCELLANEOUS) ×2 IMPLANT
LOOP VESSEL MINI RED (MISCELLANEOUS) ×2 IMPLANT
MARKER GRAFT CORONARY BYPASS (MISCELLANEOUS) IMPLANT
NS IRRIG 1000ML POUR BTL (IV SOLUTION) ×8 IMPLANT
PACK AORTA (CUSTOM PROCEDURE TRAY) ×4 IMPLANT
PACK PERIPHERAL VASCULAR (CUSTOM PROCEDURE TRAY) ×3 IMPLANT
PAD ARMBOARD 7.5X6 YLW CONV (MISCELLANEOUS) ×8 IMPLANT
PEN SKIN MARKING BROAD (MISCELLANEOUS) ×1 IMPLANT
PENCIL BUTTON HOLSTER BLD 10FT (ELECTRODE) ×1 IMPLANT
RETAINER VISCERA MED (MISCELLANEOUS) ×4 IMPLANT
SET MICROPUNCTURE 5F STIFF (MISCELLANEOUS) IMPLANT
SPONGE LAP 18X18 X RAY DECT (DISPOSABLE) ×2 IMPLANT
STAPLER VISISTAT 35W (STAPLE) ×6 IMPLANT
STOPCOCK 4 WAY LG BORE MALE ST (IV SETS) IMPLANT
SUT ETHIBOND 5 LR DA (SUTURE) IMPLANT
SUT ETHILON 3 0 PS 1 (SUTURE) IMPLANT
SUT GORETEX 5 0 TT13 24 (SUTURE) ×2 IMPLANT
SUT GORETEX 6.0 TT13 (SUTURE) IMPLANT
SUT GORETEX 6.0 TT9 (SUTURE) ×1 IMPLANT
SUT MNCRL AB 4-0 PS2 18 (SUTURE) ×12 IMPLANT
SUT PDS AB 1 TP1 96 (SUTURE) ×8 IMPLANT
SUT PROLENE 3 0 SH1 36 (SUTURE) ×7 IMPLANT
SUT PROLENE 5 0 C 1 24 (SUTURE) ×6 IMPLANT
SUT PROLENE 5 0 C 1 36 (SUTURE) IMPLANT
SUT PROLENE 6 0 BV (SUTURE) ×7 IMPLANT
SUT PROLENE 6 0 CC (SUTURE) ×2 IMPLANT
SUT PROLENE 7 0 BV 1 (SUTURE) IMPLANT
SUT SILK 2 0 (SUTURE) ×4
SUT SILK 2 0 PERMA HAND 18 BK (SUTURE) IMPLANT
SUT SILK 2 0 SH (SUTURE) ×1 IMPLANT
SUT SILK 2 0 SH CR/8 (SUTURE) ×4 IMPLANT
SUT SILK 2 0 TIES 17X18 (SUTURE) ×4
SUT SILK 2-0 18XBRD TIE 12 (SUTURE) ×3 IMPLANT
SUT SILK 2-0 18XBRD TIE BLK (SUTURE) ×3 IMPLANT
SUT SILK 3 0 (SUTURE) ×4
SUT SILK 3 0 TIES 17X18 (SUTURE) ×4
SUT SILK 3-0 18XBRD TIE 12 (SUTURE) ×3 IMPLANT
SUT SILK 3-0 18XBRD TIE BLK (SUTURE) ×3 IMPLANT
SUT VIC AB 2-0 CT1 27 (SUTURE) ×20
SUT VIC AB 2-0 CT1 TAPERPNT 27 (SUTURE) ×6 IMPLANT
SUT VIC AB 3-0 SH 27 (SUTURE) ×28
SUT VIC AB 3-0 SH 27X BRD (SUTURE) ×9 IMPLANT
SYRINGE 1CC SLIP TB (MISCELLANEOUS) ×1 IMPLANT
SYRINGE 3CC LL L/F (MISCELLANEOUS) ×1 IMPLANT
TOWEL BLUE STERILE X RAY DET (MISCELLANEOUS) ×7 IMPLANT
TRAY FOLEY W/METER SILVER 16FR (SET/KITS/TRAYS/PACK) ×4 IMPLANT
TUBING EXTENTION W/L.L. (IV SETS) ×1 IMPLANT
UNDERPAD 30X30 (UNDERPADS AND DIAPERS) ×4 IMPLANT
WATER STERILE IRR 1000ML POUR (IV SOLUTION) ×8 IMPLANT

## 2017-10-14 NOTE — Anesthesia Procedure Notes (Addendum)
Central Venous Catheter Insertion Performed by: Rica Koyanagi, MD Start/End11/27/2018 9:05 AM, 10/14/2017 9:21 AM Patient location: Pre-op. Preanesthetic checklist: patient identified, IV checked, site marked, risks and benefits discussed, surgical consent, monitors and equipment checked, pre-op evaluation and timeout performed Position: Trendelenburg Lidocaine 1% used for infiltration and patient sedated Hand hygiene performed , maximum sterile barriers used  and Seldinger technique used Catheter size: 7.5 Fr Central line was placed.Double lumen Procedure performed using ultrasound guided technique. Ultrasound Notes:anatomy identified, needle tip was noted to be adjacent to the nerve/plexus identified, no ultrasound evidence of intravascular and/or intraneural injection and image(s) printed for medical record Attempts: 1 Following insertion, line sutured. Post procedure assessment: blood return through all ports, free fluid flow and no air  Patient tolerated the procedure well with no immediate complications.

## 2017-10-14 NOTE — Consult Note (Signed)
Cardiology Consultation:   Patient ID: Dwayne Cuevas; [id number]; 1951-08-18   Admit date: 10/14/2017 Date of Consult: 10/14/2017  Primary Care Provider: Sinda Du, MD Primary Cardiologist: No primary care provider on file. Eva   Primary Electrophysiologist:     Patient Profile:   Dwayne Cuevas is a 66 y.o. male with a hx of peripheral vascular disease, COPD, coronary artery disease who is being seen today for the evaluation of cardiogenic shock at the request of Dr. Epifania Gore of PCCM.  [name] Kitchen  History of Present Illness:   Dwayne Cuevas is a 66 year old gentleman with a history of coronary artery disease, COPD, severe peripheral vascular disease. He has a history of coronary calcifications.  He had a stress test which showed some nonspecific changes and was read as low risk.  He was seen preoperatively prior to his vascular surgery and was thought to be at relatively low risk.  He had a fairly extensive vascular procedure today.  No additional history is available since the patient is sedated and intubated and ventilated  Apparently has ongoing cigarette use.  Past Medical History:  Diagnosis Date  . Cancer ([place])    skin cancer removed from leg and head  . COPD (chronic obstructive pulmonary disease) ([place])   . Coronary artery disease    "a couple blockages on each side of my heart" pt reports this but has not sure if he has had a cath  . DVT (deep venous thrombosis) ([place]) 02/05/2016  . Gout   . Hypercholesteremia   . Hypertension   . PE (pulmonary embolism) 02/05/2016  . Scoliosis     Past Surgical History:  Procedure Laterality Date  . ABDOMINAL AORTOGRAM W/LOWER EXTREMITY N/A 09/04/2017   Procedure: ABDOMINAL AORTOGRAM W/LOWER EXTREMITY;  Surgeon: Conrad Berwyn, MD;  Location: [facility];  Service: Cardiovascular;  Laterality: N/A;  . FOOT SURGERY Left    "put my foot back together"   . NECK SURGERY     discs removed, steel plate  . PERIPHERAL VASCULAR  INTERVENTION Left 09/04/2017   Procedure: PERIPHERAL VASCULAR INTERVENTION;  Surgeon: Conrad [place], MD;  Location: [facility];  Service: Cardiovascular;  Laterality: Left;  left common iliac  . SKIN CANCER EXCISION       Home Medications:  Prior to Admission medications   Medication Sig Start Date End Date Taking? Authorizing Provider  allopurinol (ZYLOPRIM) 300 MG tablet Take 1 tablet (300 mg total) by mouth daily. Patient taking differently: Take 300 mg by mouth every other day.  02/07/16  Yes Isaac Bliss, Rayford Halsted, MD  aspirin EC 81 MG tablet Take 81 mg by mouth daily.   Yes [provider]  co-enzyme Q-10 30 MG capsule Take 30 mg by mouth daily.   Yes [provider]  Cod Liver Oil CAPS Take 1 capsule by mouth at bedtime.   Yes [provider]  enalapril-hydrochlorothiazide (VASERETIC) 10-25 MG tablet Take 1 tablet by mouth daily.   Yes [provider]  lovastatin (MEVACOR) 40 MG tablet Take 40 mg by mouth every other day.    Yes [provider]  oxyCODONE-acetaminophen (PERCOCET/ROXICET) 5-325 MG tablet Take 1-2 tablets every 4 (four) hours as needed by mouth for moderate pain. 09/26/17  Yes Conrad [place], MD  Vitamin E 100 units TABS Take 100 Units by mouth daily.    Yes [provider]    Inpatient Medications: Scheduled Meds: . allopurinol  300 mg Oral QODAY  .  aspirin EC  81 mg Oral Daily  . chlorhexidine gluconate (MEDLINE KIT)  15 mL Mouth Rinse BID  . Chlorhexidine Gluconate Cloth  6 each Topical Daily  . co-enzyme Q-10  30 mg Oral Daily  . [START ON 10/15/2017] docusate sodium  100 mg Oral Daily  . enalapril-hydrochlorothiazide  1 tablet Oral Daily  . heparin subcutaneous  5,000 Units Subcutaneous Q8H  . mouth rinse  15 mL Mouth Rinse 10 times per day  . pantoprazole  40 mg Oral Daily  . pravastatin  40 mg Oral q1800  . sodium chloride flush  10-40 mL Intracatheter Q12H  . Vitamin E  100 Units Oral Daily     Continuous Infusions: . sodium chloride Stopped (10/14/17 1635)  . sodium chloride    . sodium chloride 100 mL/hr at 10/14/17 1835  . clindamycin (CLEOCIN) IV    . dexmedetomidine (PRECEDEX) IV infusion 0.5 mcg/kg/hr (10/14/17 1745)  . lactated ringers    . magnesium sulfate 1 - 4 g bolus IVPB    . phenylephrine (NEO-SYNEPHRINE) Adult infusion 30 mcg/min (10/14/17 1800)  . vasopressin (PITRESSIN) infusion - *FOR GI BLEED* 0.04 Units/min (10/14/17 1815)   PRN Meds: sodium chloride, acetaminophen **OR** acetaminophen, alum & mag hydroxide-simeth, guaiFENesin-dextromethorphan, hydrALAZINE, labetalol, magnesium sulfate 1 - 4 g bolus IVPB, metoprolol tartrate, morphine injection, ondansetron, oxyCODONE-acetaminophen, phenol, potassium chloride, sodium chloride flush  Allergies:    Allergies  Allergen Reactions  . Bee Venom     Swelling, shortness of breathe  . Shellfish Allergy Other (See Comments)    Gout, not allergy, patient doesn't eat because it causes gout  . Penicillins Rash and Other (See Comments)    Has patient had a PCN reaction causing immediate rash, facial/tongue/throat swelling, SOB or lightheadedness with hypotension: Yes Has patient had a PCN reaction causing severe rash involving mucus membranes or skin necrosis: No Has patient had a PCN reaction that required hospitalization No Has patient had a PCN reaction occurring within the last 10 years: No If all of the above answers are "NO", then may proceed with Cephalosporin use.     Social History:   Social History   Socioeconomic History  . Marital status: Divorced    Spouse name: Not on file  . Number of children: Not on file  . Years of education: Not on file  . Highest education level: Not on file  Social Needs  . Financial resource strain: Not on file  . Food insecurity - worry: Not on file  . Food insecurity - inability: Not on file  . Transportation needs - medical: Not on file  . Transportation needs -  non-medical: Not on file  Occupational History  . Not on file  Tobacco Use  . Smoking status: Former Smoker    Types: Cigarettes    Last attempt to quit: 08/31/2017    Years since quitting: 0.1  . Smokeless tobacco: Never Used  Substance and Sexual Activity  . Alcohol use: No    Frequency: Never  . Drug use: No  . Sexual activity: Not on file  Other Topics Concern  . Not on file  Social History Narrative  . Not on file    Family History:    Family History  Problem Relation Age of Onset  . Heart disease Mother   . Heart disease Father   . Cystic fibrosis Paternal Aunt      ROS:  Please see the history of present illness.  ROS  All other ROS reviewed  and negative.     Physical Exam/Data:   Vitals:   10/14/17 1745 10/14/17 1800 10/14/17 1815 10/14/17 1830  BP:  (!) 138/29    Pulse: (!) 50 (!) 54 (!) 49 (!) 51  Resp: (!) 24 19 (!) 24 (!) 24  Temp:  98.1 F (36.7 C)    TempSrc:  Oral    SpO2: 100% 100% 100% 100%  Weight:      Height:        Intake/Output Summary (Last 24 hours) at 10/14/2017 1837 Last data filed at 10/14/2017 1815 Gross per 24 hour  Intake 7883 ml  Output 2595 ml  Net 5288 ml   Filed Weights   10/14/17 0757  Weight: 184 lb (83.5 kg)   Body mass index is 24.95 kg/m.  General:   Chronically ill-appearing gentleman.  His face and neck appear fairly dusky. HEENT: ET tube in place. Lymph: no adenopathy Neck: Significant JVD. Endocrine:  No thryomegaly Vascular: No carotid bruits; FA pulses 2+ bilaterally without bruits  Cardiac: Heart sounds.  Normal S1 and S2. Lungs: Ventilator Abd: Positive bowel sounds Ext: No edema.  His pulses are intact. Musculoskeletal:  No deformities, BUE and BLE strength normal and equal Skin: warm and dry  Neuro:  CNs 2-12 intact, no focal abnormalities noted Psych:  Normal affect   EKG:  The EKG was personally reviewed and demonstrates: Sinus bradycardia.  He has no ST or T wave changes.  Right bundle  branch block. Telemetry:  Telemetry was personally reviewed and demonstrates: Sinus bradycardia  Relevant CV Studies:   Laboratory Data:  ChemistryNo results for input(s): NA, K, CL, CO2, GLUCOSE, BUN, CREATININE, CALCIUM, GFRNONAA, GFRAA, ANIONGAP in the last 168 hours.  No results for input(s): PROT, ALBUMIN, AST, ALT, ALKPHOS, BILITOT in the last 168 hours. HematologyNo results for input(s): WBC, RBC, HGB, HCT, MCV, MCH, MCHC, RDW, PLT in the last 168 hours. Cardiac EnzymesNo results for input(s): TROPONINI in the last 168 hours. No results for input(s): TROPIPOC in the last 168 hours.  BNPNo results for input(s): BNP, PROBNP in the last 168 hours.  DDimer No results for input(s): DDIMER in the last 168 hours.  Radiology/Studies:  Dg Ang/ext/uni/or Left  Result Date: 10/14/2017 CLINICAL DATA:  Aortobifemoral bypass EXAM: LEFT ANG/EXT/UNI/ OR COMPARISON:  None. FINDINGS: Contrast fills the distal superficial femoral artery and popliteal artery. There is a focal occlusion of the popliteal artery just above the knee. The popliteal artery reconstitutes just below the occlusion. There is poor filling of the tibial vessels. IMPRESSION: See above. Electronically Signed   By: Marybelle Killings M.D.   On: 10/14/2017 17:24    Assessment and Plan:   1. Severe hypertension following vascular surgery: We were asked to see the patient for an episode of severe hypotension.  His blood pressure is now back to normal but he is on multiple pressors.  I attempted a bedside echocardiogram but I was not able to visualize the heart at all.  He has COPD and is on the vent.  I have called the echo tech and we will get an echocardiogram tonight.  2.  Sinus bradycardia: He is on Precedex which is probably contributing to his bradycardia.  I suspect he will do better with a heart rate of 80-90 instead of 50. I discussed the case with Dr. Sabra Heck.  He will stop the Precedex and use other medications that do not cause  bradycardia.  We have labs pending including troponin levels, CBC, electrolytes.  For questions or updates, please contact [place] Please consult [email or web address] for contact info under Cardiology/STEMI.   Signed, Mertie Moores, MD  10/14/2017 6:37 PM

## 2017-10-14 NOTE — Anesthesia Procedure Notes (Signed)
Arterial Line Insertion Start/End11/27/2018 9:20 AM Performed by: Verdie Drown, CRNA, CRNA  Preanesthetic checklist: patient identified, IV checked, site marked, risks and benefits discussed, surgical consent, monitors and equipment checked, pre-op evaluation, timeout performed and anesthesia consent Lidocaine 1% used for infiltration radial was placed Hand hygiene performed  and maximum sterile barriers used   Attempts: 1 (Attempt x3 by K. White, CRNA) Procedure performed without using ultrasound guided technique. Following insertion, dressing applied and Biopatch. Post procedure assessment: normal  Patient tolerated the procedure well with no immediate complications.

## 2017-10-14 NOTE — Op Note (Signed)
OPERATIVE NOTE   PROCEDURE: 1. Aortobifemoral bypass 2. Right iliofemoral endarterectomy  3. Left leg angiogram 4. Left leg thrombectomy 5. Right common femoral artery to below-the-knee popliteal artery bypass with Propaten 6. Aborted left tibial exploration  PRE-OPERATIVE DIAGNOSIS: right leg rest pain, small abdominal aortic aneurysm, extensive calcific aortoiliac atherosclerosis  POST-OPERATIVE DIAGNOSIS: same as above   CO-SURGEONS: Adele Barthel, MD; Gae Gallop, MD  ASSISTANT(S): Linus Orn, CSA  ANESTHESIA: general  ESTIMATED BLOOD LOSS: 2000 cc  BLOOD: 2 u pRBC, 700 cc BRAT  FLUIDS: 1250 cc Albumin  UOP: 535 cc  FINDING(S): 1.  Extensive infrarenal thrombus within infrarenal abdominal aortic aneurysm  2.  Severely calcific bilateral common iliac artery  3.  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  Intact retrograde inferior mesenteric artery blood flow 5.  Possible embolus at-the-knee popliteal artery 6.  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  Less appearance of left leg after thrombectomy without dopplerable signals 8.  Dopplerable right anterior tibial artery and posterior tibial artery at end of case  SPECIMEN(S):  none  INDICATIONS:   Dwayne Cuevas is a 66 y.o. male who presents with rest pain in right leg with ascending right foot anesthesia and small abdominal aortic aneurysm.  I recommended aortobifemoral bypass and possible right leg femoropopliteal bypass.  The patient is aware the risks of aortic surgery include but are not limited to: bleeding, need for transfusion, infection, death, stroke, paralysis, wound complications, bowel injuries, impotence, bowel ischemia, extended ventilation and future ventral hernias.  Overall, I cited a mortality rate of 5-10% and morbidity rate of 30%.  The risk, benefits, and alternative for bypass operations were discussed with the patient.  The patient is aware the  risks include but are not limited to: bleeding, infection, myocardial infarction, stroke, limb loss, nerve damage, limb edema, need for additional procedures in the future, wound complications, and inability to complete the bypass.  The patient is aware of these risks and agreed to proceed.   DESCRIPTION: After obtaining full informed written consent, the patient was brought back to the operating room and placed supine upon the operating table.  The patient received IV antibiotics prior to induction.  A procedure time out was completed and the correct surgical site was verified.  After obtaining adequate anesthesia, the patient was prepped and draped in the standard fashion for: aortobifemoral bypass and bilateral leg bypasses.   A two surgeon technique was planned due to the expected long duration of this case and desire to limit cardiac risk.  I turned my attention to the right groin.  Under Sonosite guidance, I identified the right common femoral artery.  I made an incision over this artery and then dissected it out with electrocautery.  The right common femoral artery was chronically occluded with severe calcific atherosclerosis.  I felt this would likely need exploration and endarterectomy.  I dissected out a large lateral circumflex branch and then also the superficial femoral artery which was also chronically occluded.  I was able to find three profunda femoris arteries arising from the common femoral artery.  I placed vessel loops on all of these branches.  I dissected out the right external iliac artery and ligated the cross vein.  I was able to bluntly dissect a retroperitoneal tunnel on top the external iliac artery.  In a similar fashion, Dr. Scot Dock dissected out the left common femoral artery, see his note for details.  This artery was less  diseased than the right common femoral artery.  I turned my attention to the patient's mid-line.  I made an incision in the mid-line and then dissected  through the subcutaneous tissue down to the fascia.  I opened the mid-line fascia and then bluntly into the peritoneum.  I opened the rest of the fascia and peritoneum under direct visualization.  I had to lyse some adhesions to the omentum to free it up.  I briefly inspected the intraabdominal contents and no frank disease was noted.  I placed wet lap pads along the abdominal wall.  The Balfour retractor was applied to get lateral exposure.  I set up the Omnitract retractor system.    I eviscerated the transverse colon into a wet towel and then eviscerated the small bowel into a wet towel.  I retracted the small bowel and transverse colon to gain exposure to the retroperitoneum.  I had to release the duodenum with sharp dissection to release more the the duodenum off the retroperitoneum.  I reset the traction to gain access to the retroperitoneum overlying the aorta.  Using electrocautery, I dissected out the aorta from infrarenal segment down to aortic bifurcation.  In this process, I found the left renal vein which was retracted.  I gave 9000 units of Heparin and 12.5 g of Mannitol.  An additional 2000 units of Heparin was administered every hour after initial bolus to maintain anticoagulation.  In total, 20000 units of Heparin was administrated to achieve and maintain a therapeutic level of anticoagulation.  After retraction of the renal vein, I was able to expose the left renal artery, which I controlled with vessel loops.  Similarly, I dissected out the right renal artery which was slightly lower than the left renal artery.  I was able to bluntly dissect out the infrarenal aortic neck.  I was able to pass an umbilical tape around the neck.    I turned my attention to the pelvis, where I dissected out the right common iliac artery.  This was heavily calcified without any pulse.  I dissected out a retroperitoneal tunnel here and passed a ringed forcep from the right groin, immediately anterior to the iliac  arterial system up into pelvis.  An umbilical tape was passed up the tunnel and clamped.  I then dissected out the left common iliac artery.  This was also severely calcified.  I could feel a stent here.  I dissected out the left common iliac artery distal to the stent and passed two 1 Ethibond ties behind the left common iliac artery.  The left retroperitoneal tunnel was dissected out by Dr. Scot Dock in a similar fashion as the right side and an umbilical tape passed through the retroperitoneal tunnel.    At this point, I returned my attention to the infrarenal aorta.  I measured the aorta with sizers and selected a 14 mm x 8 mm aortobifemoral graft.  I placed the two renal arteries under tension and then clamped the infrarenal aorta immediately distal to both renal arteries with a Debakey aortic clamp.  I made an transverse incision in the infrarenal 2 cm distal to the clamp.  Immediately chronic thrombus was evident.  I carried the sharp dissection circumferentially.  In this process, I encountered a lumbar that needed immediately suture ligation with 3-0 Silk.  I extended the aortotomy distally in the anterior wall.  I evacuated chronic thrombus, revealing multiple lumbar arteries that requiring suture ligation.  Eventually, I had essentially control of all obvious  lumbar arteries with limited endarterectomy of posterior plaque and suture ligation with 3-0 silk.  I had to do a limited endarterectomy of the infrarenal neck due to heavy calcification.  I unclamped the aorta briefly to evacuate any thrombus at the clamp.  There was no thrombus noted.  I reclamped the aorta and then released the vessel loops on the renal arteries.  I sharply shortened the main body of the aortobifemoral graft.  I sewed the aortobifemoral graft to the infrarenal neck with a running stitch of 3-0 Prolene, taking care to take large stitches.  Prior to completion of the backwall, I pulled up on the suture line with a nerve hook.  I  continued with the circumferential aortic anastomosis.  Prior to completion, I pulled up on the suture line with the nerve hook.  I completed this anastomosis in the usual fashion.  I released the aortic clamp, which demonstrated some bleeding from the anastomotic line.  I repaired the anastomosis with 3-0 Prolene stitches until there was no further active bleeding.  I allowed each limb to bleed, evacuating any thrombus.  I placed Fogarty clamps on each limb proximally.    I pulled the right aortofemoral limb through the retroperitoneal tunnel by tieing the limb to the umbilical tape in that tunnel.  Similarly the left aortofemoral limb was delivered through the left retroperitoneal tunnel.  See Dr. Nicole Cella dictation for details regarding the left femoral anastomosis.  I turned my attention to the right common femoral artery.  I clamped the distal right external iliac artery and then placed the large lateral circumflex branch and profunda femoral arteries under tension.  I made an anterior incision in the right common femoral artery with a 15 blade.  There was NO lumen.  I extended this proximally to proximal common femoral artery and distally on the proximal superficial femoral artery, distal to the profunda femoral arterial orifices.  The lumen throughout was occluded with a calcified plaque.  I dissected the plaque out in the mid-segment common femoral artery and cracked it in half with a Mayo scissor.  I carried the endarterectomy into the distal external iliac artery proximal to the large lateral circumflex branch.  I pulled out this calcific plug and had some return of antegrade bleeding from distal external iliac artery.  I then carried the endarterectomy into the superficial femoral artery, which was chronically occluded.  I transected the plaque in the proximal segment.  I then was able to pull the plaque out of the profunda femoral arteries, getting the plaque to feather out of the orifices.  I was  able to get return of vigorous backbleeding from 2 out 3 profunda femoral arteries.  I continued with the endarterectomy until there was no further loose intimal flaps.  The residual disease was densely adherent to vessel wall.  I felt any further endarterectomy would result in rupture of the wall.  I spatulated the right aortofemoral limb to the geometry of this arteriotomy.  I sewed the right aortofemoral limb to the right common femoral artery, extending slightly onto superficial femoral artery with two running stitches of 5-0 Prolene, starting each from the heel or the toe of the anastomosis.  I backbled the profunda femoral arteries, external iliac artery and aortofemoral graft prior to completion of this anastomosis.  The anastomotic suture line was pulled up with a nerve hook prior to completion of this anastomosis.  I released all vessel loops and clamps.  Immediately, the right foot appeared well  perfused.  At this point, Dr. Scot Dock had completed his left femoral anastomosis.  The left foot appeared to be more pallorous and no doppler signals were noted.  We elected to do a limited left leg angiogram.  Initially a butterfly needle was placed in the left aortofemoral graft and a hand injection was completed.  This failed to reveal anything, so the puncture wound was repaired and the needle was repositioned into the right superficial femoral artery.  The proximal superficial femoral artery was clamped and then an hand injection was completed.  This demonstrated flow down to the popliteal artery with possible embolus at the-knee level with persistence of flow into proximal anterior tibial artery.  At this point, Dr. Scot Dock extended the incision in the left groin.  He dissected out the proximal superficial femoral artery.  He placed vessels loops proximally and distally and placed the artery under tension.  He removed the needle and then made an arteriotomy through the level of the needle puncture.  He  passed the 3 Fogarty distally all the way down to 70 cm.  He extracted what appeared to be chronic aortic thrombus.  After 2 more passes, no further thrombus was noted.  The backbleeding from the superficial femoral artery was improved.  The arteriotomy was repaired with two 6-0 Prolene stitches from each end and tied in the middle.  There was no doppler signals distally but the foot appeared to be less ischemic.  We elected to watch the left foot for now.  At this point, the patient appeared to be tolerating the procedure without any difficulties.  I elected to proceed with the femoropopliteal bypass.  I looked at both calves and despite the vein mapping preoperatively, neither greater saphenous vein was adequate for bypass purposes.  I elected to proceed with prosthetic femoropopliteal bypass.  I turned my attention right calf.  I placed a bump and externally rotated the right knee.  I made an incision one finger-breath behind the tibia.  I dissected through the subcutaneous tissue and then through the fascia with electrocautery.  In this process, I encountered the greater saphenous vein.  This appeared to be <2 mm in diameter, unsuitable for bypass purposes.  I dissected out the medial popliteal vein.  This appeared to chronically thrombosed with pallorous sclerotic appearance.  I dissected out the below-the-knee popliteal artery which appeared to be soft enough for a distal target.  I placed vessel loops proximally and distally.  I dissect out the femoral condyles and popliteal vessels, creating a space for the Gore metal tunneler.  I dissected a intramuscular tunnel from the below-the-knee popliteal artery exposure up the right groin in a sub-sartorial fashion.  I removed the tunnel and then sewed a 6 mm Propaten to the inner cannula.  I pulled the graft through the tunnel, taking care to maintain orientation.  The metal tunnel was removed, leaving the graft in place.  I adjusted the position of the graft and  clamped the distal graft in the calf.  I protected the extra length graft with sterile tunnel.    I turned my attention to the right groin.  The external iliac artery was clamped.  The large lateral circumflex branch and profunda femoral arteries were placed under tension.  I clamped the right aortofemoral graft proximally in the groin.  I made an graftotomy with a 11-blade in the distal aortofemoral graft.  I spatulated the proximal end of the Propaten graft to meet the dimension of the graftotomy.  I sewed the Propaten graft to the aortofemoral limb in an end-to-side configuration with a running stitch of CV-5.  Prior to completion of this anastomosis, I pulled up on the suture line with needle hook.  The anastomosis was completed in the usual fashion.  I allowed the graft to antegrade bleed after releasing all vessel loops and clamps.  There excellent pulsatile bleeding.  I clamped the graft near its anastomosis.    I turned my attention to the popliteal exposure.  I placed the right below-the-knee popliteal artery under tension proximally and distally.  I made an arteriotomy with a 11 blade.  I extended this arteriotomy proximally and distally.  This arterial wall was somewhat calcified.  I pulled the graft to tension and extended the right leg to determine the length of graft needed.  I transected the extra graft and then spatulated the distal graft for the dimensions of this arteriotomy.  I sewed the Propaten graft to the below-the-knee popliteal artery in an end-to-side configuration with a running stitch of CV-6.  Prior to completion, I backbled both ends of the below-the-knee popliteal artery artery.  There was reasonable backbleeding from both ends of the below-the-knee popliteal artery.  I then allowed the femoropopliteal graft to bleed: excellent pulsatile bleeding.  I completed this anastomosis in the usual fashion.  All vessel loops and clamps were removed.  Immediately there was a pulse in the  below-the-knee popliteal artery.  Distally I could identify an anterior tibial artery and posterior tibial artery doppler signals.  At this point, the left foot appeared less ischemic but markedly different from right foot.  Dr. Scot Dock started to explore the left calf to perform a possible tibial thrombectomy through a popliteal exposure.  I meanwhile turned my attention to both groins.  There was no further active bleeding.  Both groins were washed out and Avitene applied.  After a few minutes, no further bleeding was noted.  Each groin was repaired with a double layer of 2-0 Vicryl in the plane immediately superficial to the grafts, a double layer of 3-0 Vicryl and finally a running subcuticular stitch of 4-0 Vicryl in the skin.  The skin in each groin was cleaned, dried, and reinforced with Dermabond.  At this point, I turned my attention to the abdomen.  There appeared to be some backbleeding from the Inferior mesenteric artery orifice, suggesting adequate collateral flow.  I ligated this with a 2-0 silk.  No further pulsatile bleeding was noted.  I packed the abdomen with Avitene and lap pads.   At this point, Dr. Scot Dock had exposure of the left popliteal artery and tibioperoneal trunk.  Both were noted to be very lateral and calcified.    Around this time, Anesthesia noted onset of severe hypotension not responsive to vasopressor support.  I immediately clamped the aortic graft and this failed to increase the blood pressure.  Emergency measures were initiated by Anesthesia.  The patient was given an additional unit of pRBC and vasopressin support was initiated on top of phenylephrine drip.  Eventually blood pressure recovered, but I felt not further measures were possible at the patient was now unstable.  Dr. Scot Dock was washed out the left calf.  All active bleeding was controlled.  He repaired the left popliteal exposure was a layer of 3-0 Vicryl in the subcutaneous tissue and then the skin was  stapled.    At this point, I wrapped the aortofemoral graft with some of the aortic sac.  The rest  of the retroperitoneum was reapproximated with a running stitch of 3-0 Vicryl.  I washed out the abdomen and no further active bleeding was noted.  I did not feel it was safe to give Protamine at this point due to the resolving hypotension.  I briefly re-examined the intra-abdominal organs and intestines and no ischemic disease was evident.   I removed all the retractors.   I returned the bowels to their anatomic position.  The transverse colon with omentum was draped over the intestines.  I repaired the fascia with two double stranded 1 PDS stitches running from each end of the fascia.  I tied the two stitches together in the middle.  The skin was reapproximated with staples.  The right calf was washed out and then the muscle reapproximated to cover the graft.  The subcutaneous tissue was repaired in two layers and finally the skin was repaired with a running subcuticular stitch of 4-0 Vicryl.  The skin was cleaned, dried, and reinforced with Dermabond.   The plan is to take this patient back to the ICU for resuscitation and additional work-up.  My suspicion is the patient sustained a myocardial infarction given the adequate volume resuscitation and good urine output through the case and lack of response to aortic cross clamping.   COMPLICATIONS: hypotension  CONDITION: critical   Adele Barthel, MD, FACS [facility] Office: [phone] Pager: [phone]  10/14/2017, 5:16 PM

## 2017-10-14 NOTE — Consult Note (Signed)
PULMONARY / CRITICAL CARE MEDICINE   Name: Dwayne Cuevas MRN: [id number] DOB: May 09, 1951    ADMISSION DATE:  10/14/2017 CONSULTATION DATE:  12/14/16  REFERRING MD:  Dr. [name]  CHIEF COMPLAINT:  Hypotension  HISTORY OF PRESENT ILLNESS:   History is obtained from the chart and from my discussions with Dr. [name].   Dwayne Cuevas is a 66 y.o. (February 04, 1951) male who presents with chief complaint: R leg rest pain.  The patient underwent angiography on 09/04/17 which demonstrated occluded R iliac arterial system, common femoral artery and superficial femoral artery with heavily calcified and stenosis L CIA.  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  I stented the L CIA to preserve L iliac arterial flow in the event his cardiac risk stratification was high risk.  The patient has been cleared for open surgery at this point by his cardiologist.  Patient notes numbness in his right foot is getting worse.  We were asked to see the patient acutely in the post operative settting for the development of hypotension intra operatively, requiring vasopressor support. Currently he is in the Cardiac ICU, intubated, sedated and on vasopressor support.  PAST MEDICAL HISTORY :  He  has a past medical history of Cancer ([place]), COPD (chronic obstructive pulmonary disease) ([place]), Coronary artery disease, DVT (deep venous thrombosis) ([place]) (02/05/2016), Gout, Hypercholesteremia, Hypertension, PE (pulmonary embolism) (02/05/2016), and Scoliosis.  PAST SURGICAL HISTORY: He  has a past surgical history that includes Skin cancer excision; ABDOMINAL AORTOGRAM W/LOWER EXTREMITY (N/A, 09/04/2017); PERIPHERAL VASCULAR INTERVENTION (Left, 09/04/2017); Neck surgery; and Foot surgery (Left).  Allergies  Allergen Reactions  . Bee Venom     Swelling, shortness of breathe  . Shellfish Allergy Other (See Comments)    Gout, not allergy, patient doesn't eat because it causes gout  . Penicillins Rash and Other (See Comments)    Has  patient had a PCN reaction causing immediate rash, facial/tongue/throat swelling, SOB or lightheadedness with hypotension: Yes Has patient had a PCN reaction causing severe rash involving mucus membranes or skin necrosis: No Has patient had a PCN reaction that required hospitalization No Has patient had a PCN reaction occurring within the last 10 years: No If all of the above answers are "NO", then may proceed with Cephalosporin use.     No current facility-administered medications on file prior to encounter.    Current Outpatient Medications on File Prior to Encounter  Medication Sig  . allopurinol (ZYLOPRIM) 300 MG tablet Take 1 tablet (300 mg total) by mouth daily. (Patient taking differently: Take 300 mg by mouth every other day. )  . aspirin EC 81 MG tablet Take 81 mg by mouth daily.  [name] Kitchen co-enzyme Q-10 30 MG capsule Take 30 mg by mouth daily.  [name] Kitchen Cod Liver Oil CAPS Take 1 capsule by mouth at bedtime.  . enalapril-hydrochlorothiazide (VASERETIC) 10-25 MG tablet Take 1 tablet by mouth daily.  [name] Kitchen lovastatin (MEVACOR) 40 MG tablet Take 40 mg by mouth every other day.   . oxyCODONE-acetaminophen (PERCOCET/ROXICET) 5-325 MG tablet Take 1-2 tablets every 4 (four) hours as needed by mouth for moderate pain.  . Vitamin E 100 units TABS Take 100 Units by mouth daily.     FAMILY HISTORY:  His indicated that his mother is deceased. He indicated that his father is alive. He indicated that his sister is alive. He indicated that the status of his paternal aunt is unknown.   SOCIAL HISTORY: He  reports that he quit smoking about 6 weeks  ago. His smoking use included cigarettes. he has never used smokeless tobacco. He reports that he does not drink alcohol or use drugs.  REVIEW OF SYSTEMS:   Can not be obtained.  SUBJECTIVE:  Sedated.  VITAL SIGNS: BP (!) 105/38   Pulse (!) 54   Temp 98.1 F (36.7 C) (Oral)   Resp 19   Ht 6' (1.829 m)   Wt 83.5 kg (184 lb)   SpO2 100%   BMI 24.95 kg/m    HEMODYNAMICS: CVP:  [11 mmHg] 11 mmHg  VENTILATOR SETTINGS: Vent Mode: PRVC FiO2 (%):  [100 %] 100 % Set Rate:  [24 bmp] 24 bmp Vt Set:  [161 mL] 620 mL PEEP:  [5 cmH20] 5 cmH20 Plateau Pressure:  [18 cmH20] 18 cmH20  INTAKE / OUTPUT: No intake/output data recorded.  PHYSICAL EXAMINATION: General:  Intubated,sedated. No distress observed. Neuro:  Rass of -3. HEENT:  ETT, OGT. PERL, sluggish. No JVD or icterus. Right IJ central line. No bleeding around the site. Cardiovascular:  S1s2, bradycardic regular rhythm. ECG monitor reveals sinus bradycardia. No murmur or gallop. Lungs:  Bilateral breath sounds. Clear to auscultation. No subq emphysema/crepitus. Abdomen:  Midline bandaged surgical incision. Flat, soft, no tympanny. Musculoskeletal:  Bilateral femoral, calve surgical incisions. No bleeding from the sites. Left radial artery catheter. Skin:  Cool to touch over the left foot. No ischemic changes. No pitting edema.  LABS:  BMET No results for input(s): NA, K, CL, CO2, BUN, CREATININE, GLUCOSE in the last 168 hours.  Electrolytes No results for input(s): CALCIUM, MG, PHOS in the last 168 hours.  CBC No results for input(s): WBC, HGB, HCT, PLT in the last 168 hours.  Coag's No results for input(s): APTT, INR in the last 168 hours.  Sepsis Markers No results for input(s): LATICACIDVEN, PROCALCITON, O2SATVEN in the last 168 hours.  ABG No results for input(s): PHART, PCO2ART, PO2ART in the last 168 hours.  Liver Enzymes No results for input(s): AST, ALT, ALKPHOS, BILITOT, ALBUMIN in the last 168 hours.  Cardiac Enzymes No results for input(s): TROPONINI, PROBNP in the last 168 hours.  Glucose No results for input(s): GLUCAP in the last 168 hours.  Imaging Dg Ang/ext/uni/or Left  Result Date: 10/14/2017 CLINICAL DATA:  Aortobifemoral bypass EXAM: LEFT ANG/EXT/UNI/ OR COMPARISON:  None. FINDINGS: Contrast fills the distal superficial femoral artery and  popliteal artery. There is a focal occlusion of the popliteal artery just above the knee. The popliteal artery reconstitutes just below the occlusion. There is poor filling of the tibial vessels. IMPRESSION: See above. Electronically Signed   By: Marybelle Killings M.D.   On: 10/14/2017 17:24     STUDIES:  Bedside KUB shows non specific gas pattern, midline surgical staples, and a tube in the distal portion of the stomach. Bedside cxr shows ett tip approximately at the arch of the aorta, and in the trachea. Right IJ catheter in proper position. No pulmonary edema/opacities. No pneumothorax. Cardiac silhouette appears unremarkable.   Official reports are pending.  CULTURES: None     ANTIBIOTICS: none  SIGNIFICANT EVENTS: Post operative hypotension.  LINES/TUBES: ETT 11/27 Right IJ 11/27 Left arterial line, radial 11/27  DISCUSSION: 66 y.o. Male with postoperative hypotension requiring vasopressor support.  ASSESSMENT / PLAN:  PULMONARY A: Acute postoperative respiratory failure due to hypotension  P:   ABG's Vt of 8cc/kg of IBW Peep of 5cm Continue mechanical ventilation until hypotension resolves.  CARDIOVASCULAR A: Hypotension Had preoperative cardiac clearance. H/O coronary calcifications, normal nuclear  stress test 2017. Cleared preoperatively for surgery.  P:  1. Cardiology to evaluate tonight. 2. Echocardiogram tonight 3. Serial troponins 4. ECG 5. Wean off vasopressin. Continue neosynephrine and wean as tolerated. Maintain MAP of at least 60-65 mm Hg. 6. Discontinue precedex as it is likely contributing to hypotension and bradycardia. Use Versed for sedation.  RENAL A:  No issues  P:   Monitor renal function.  GASTROINTESTINAL A:  No issues  P:   PPI for stress prophylaxis  HEMATOLOGIC A:  Acute postoperative blood loss  P: Monitor H/H, transfuse if Hgb < 8gms giving his risk for myocardial ischemia.   INFECTIOUS A:  No issues  P:   Monitor  temperature  ENDOCRINE A:  No issues    P:  S/s insulin for glycemic control if needed.   NEUROLOGIC A:  Sedation  P:   RASS goal: -1 to -2    FAMILY  - Updates: family not available at this time.  - Inter-disciplinary family meet or Palliative Care meeting due by:  day 7  CRITICAL CARE Performed by: Jesus Genera   Total critical care time: 65 minutes  Critical care time was exclusive of separately billable procedures and treating other patients.  Critical care was necessary to treat or prevent imminent or life-threatening deterioration.  Critical care was time spent personally by me on the following activities: development of treatment plan with patient and/or surrogate as well as nursing, discussions with consultants, evaluation of patient's response to treatment, examination of patient, obtaining history from patient or surrogate, ordering and performing treatments and interventions, ordering and review of laboratory studies, ordering and review of radiographic studies, pulse oximetry and re-evaluation of patient's condition.  [facility] [place] Pager: [phone]  10/14/2017, 6:17 PM

## 2017-10-14 NOTE — Interval H&P Note (Signed)
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 After consideration of risks, benefits and other options for treatment, the patient has consented to  Procedure(s): AORTA BIFEMORAL BYPASS GRAFT (Bilateral) BYPASS GRAFT FEMORAL-POPLITEAL ARTERY RIGHT (Right) as a surgical intervention .  The patient's history has been reviewed, patient examined, no change in status, stable for surgery.  I have reviewed the patient's chart and labs.  Questions were answered to the patient's satisfaction.     Adele Barthel

## 2017-10-14 NOTE — Transfer of Care (Signed)
Immediate Anesthesia Transfer of Care Note  Patient: Dwayne Cuevas  Procedure(s) Performed: AORTA BIFEMORAL BYPASS GRAFT (Bilateral ) Right FEMORAL-POPLITEAL ARTERY Bypass graft using gortex graft (Right Leg Upper) INTRA OPERATIVE ARTERIOGRAM (Left Leg Lower) THROMBECTOMY Left Lower leg (Left Leg Lower)  Patient Location: ICU  Anesthesia Type:General  Level of Consciousness: Patient remains intubated per anesthesia plan  Airway & Oxygen Therapy: Patient remains intubated per anesthesia plan and Patient placed on Ventilator (see vital sign flow sheet for setting)  Post-op Assessment: Report given to RN and Post -op Vital signs reviewed and stable  Post vital signs: Reviewed and stable  Last Vitals:  Vitals:   10/14/17 0915 10/14/17 0920  BP: (!) 115/47 (!) 105/38  Pulse: 62 62  Resp: 16 14  Temp:    SpO2: 100% 99%    Last Pain:  Vitals:   10/14/17 0910  TempSrc:   PainSc: 4       Patients Stated Pain Goal: 3 (15/94/58 5929)  Complications: No apparent anesthesia complications

## 2017-10-14 NOTE — Progress Notes (Signed)
  Echocardiogram 2D Echocardiogram with Definity has been performed.  Tresa Res 10/14/2017, 7:24 PM

## 2017-10-14 NOTE — Progress Notes (Signed)
[facility] Progress Note Patient Name: Dwayne Cuevas DOB: February 16, 1951 MRN: [id number]   Date of Service  10/14/2017  HPI/Events of Note  Hyperkalemia with no ectopy but bradycardia Hypomag  eICU Interventions  Kayexalate Calcium gluconate x 1 Mag replaced at lower dose given renal insufficiency     Intervention Category Intermediate Interventions: Electrolyte abnormality - evaluation and management  DETERDING,ELIZABETH 10/14/2017, 8:14 PM

## 2017-10-14 NOTE — Progress Notes (Signed)
   Daily Progress Note  Called the patient's sister around 6 PM and updated her of the patient's status.  Laboratory   CBC CBC Latest Ref Rng & Units 10/14/2017 10/07/2017 09/04/2017  WBC 4.0 - 10.5 K/uL 16.4(H) 8.4 -  Hemoglobin 13.0 - 17.0 g/dL 9.9(L) 13.1 14.6  Hematocrit 39.0 - 52.0 % 29.0(L) 40.0 43.0  Platelets 150 - 400 K/uL 173 207 -    BMP BMP Latest Ref Rng & Units 10/07/2017 09/04/2017 05/09/2016  Glucose 65 - 99 mg/dL 107(H) 98 -  BUN 6 - 20 mg/dL 22(H) 24(H) -  Creatinine 0.61 - 1.24 mg/dL 1.49(H) 1.50(H) 1.40(H)  Sodium 135 - 145 mmol/L 139 141 -  Potassium 3.5 - 5.1 mmol/L 4.6 4.6 -  Chloride 101 - 111 mmol/L 109 104 -  CO2 22 - 32 mmol/L 21(L) - -  Calcium 8.9 - 10.3 mg/dL 9.4 - -    Coagulation Lab Results  Component Value Date   INR 1.37 10/14/2017   INR 1.03 10/07/2017   INR 1.11 02/07/2016   PTT 74  - will given 2 u FFP for coagulopathy - awaiting rest of labs - appreciate Cardiology's input  Adele Barthel, MD, FACS [facility] Office: [phone] Pager: [phone]  10/14/2017, 7:16 PM

## 2017-10-14 NOTE — Progress Notes (Signed)
Dr. Trula Slade notified regarding left foot color and tempature more ischemic and colder over the last few hours. Came to bedside to assess patient. No new orders received. Will continue to monitor.

## 2017-10-14 NOTE — Anesthesia Procedure Notes (Signed)
Procedure Name: Intubation Date/Time: 10/14/2017 9:39 AM Performed by: White, Amedeo Plenty, CRNA Pre-anesthesia Checklist: Patient identified, Emergency Drugs available, Suction available and Patient being monitored Patient Re-evaluated:Patient Re-evaluated prior to induction Oxygen Delivery Method: Circle System Utilized Preoxygenation: Pre-oxygenation with 100% oxygen Induction Type: IV induction Ventilation: Mask ventilation without difficulty and Oral airway inserted - appropriate to patient size Laryngoscope Size: Mac and 4 Grade View: Grade I Tube type: Oral Tube size: 7.5 mm Number of attempts: 1 Airway Equipment and Method: Stylet and Oral airway Placement Confirmation: ETT inserted through vocal cords under direct vision,  positive ETCO2 and breath sounds checked- equal and bilateral Secured at: 23 cm Tube secured with: Tape Dental Injury: Teeth and Oropharynx as per pre-operative assessment

## 2017-10-14 NOTE — Anesthesia Preprocedure Evaluation (Addendum)
Anesthesia Evaluation  Patient identified by MRN, date of birth, ID band Patient awake    Airway Mallampati: II  TM Distance: >3 FB Neck ROM: Full    Dental  (+) Teeth Intact   Pulmonary COPD, former smoker,    breath sounds clear to auscultation       Cardiovascular hypertension, + Peripheral Vascular Disease   Rhythm:Regular Rate:Normal  ECHO and preop cardiac clearance notes reviewed   Neuro/Psych    GI/Hepatic Neg liver ROS,   Endo/Other  negative endocrine ROS  Renal/GU negative Renal ROS     Musculoskeletal  (+) Arthritis ,   Abdominal   Peds  Hematology   Anesthesia Other Findings   Reproductive/Obstetrics                           Anesthesia Physical Anesthesia Plan  ASA: III  Anesthesia Plan: General   Post-op Pain Management:    Induction: Intravenous  PONV Risk Score and Plan: 3 and Treatment may vary due to age or medical condition, Dexamethasone and Ondansetron  Airway Management Planned: Oral ETT  Additional Equipment: Arterial line, Ultrasound Guidance Line Placement and CVP  Intra-op Plan:   Post-operative Plan: Extubation in OR and Possible Post-op intubation/ventilation  Informed Consent: I have reviewed the patients History and Physical, chart, labs and discussed the procedure including the risks, benefits and alternatives for the proposed anesthesia with the patient or authorized representative who has indicated his/her understanding and acceptance.     Plan Discussed with:   Anesthesia Plan Comments:         Anesthesia Quick Evaluation

## 2017-10-14 NOTE — Op Note (Signed)
    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 Scot Dock, MD, Adele Barthel, MD  ASSIST: Linus Orn, SA  ANESTHESIA: General   EBL: Per anesthesia record  INDICATIONS:    Dwayne Cuevas is a 66 y.o. male who presents with an abdominal aortic aneurysm and aortoiliac occlusive disease.      TECHNIQUE:   Given the complexity of the case, and the patients comorbidities, a two team approach was used in order to expedite the procedure.  On the left side, a longitudinal incision was made below the inguinal ligament.  The dissection was carried down to the common femoral artery which was controlled with a vessel loop.  The artery was markedly calcified although there was a soft spot on the anterior lateral aspect of the common femoral artery.  In order to find a place to clamp proximally I did have to dissected well under the inguinal ligament.  Multiple side branches were controlled with Vesseloops.  I was able to find an area to clamp well above the inguinal ligament.  The crossing vein was divided between 2-0 silk ties and a retroperitoneal tunnel was started. The superficial femoral artery was controlled distally and the deep femoral artery and also a large posterior branch were controlled with Vesseloops.  Once the left limb of the graft had been tunneled to the left groin the superficial femoral artery and deep femoral artery was controlled with Vesseloops.  The external iliac artery was clamped.  A longitudinal arteriotomy was made in the anterior lateral aspect of the left common femoral artery over the area that was soft.  This was extended with Pott scissors.  The left limb of the graft was cut to the  appropriate length, spatulated and sewn end-to-side to the common femoral artery using continuous 5-0 Prolene suture.  Prior to completing this anastomosis the artery was backbled and flushed appropriately and the anastomosis completed.  Flow was reestablished to the left leg which the patient tolerated from a hemodynamic standpoint.  The remainder of the dictation is as dictated by Dr. Adele Barthel.  Deitra Mayo, MD, FACS [facility]  DATE OF DICTATION:   10/14/2017

## 2017-10-15 ENCOUNTER — Other Ambulatory Visit: Payer: Self-pay

## 2017-10-15 ENCOUNTER — Inpatient Hospital Stay (HOSPITAL_COMMUNITY): Payer: Medicare Other

## 2017-10-15 ENCOUNTER — Encounter (HOSPITAL_COMMUNITY): Payer: Self-pay | Admitting: Vascular Surgery

## 2017-10-15 DIAGNOSIS — G9340 Encephalopathy, unspecified: Secondary | ICD-10-CM

## 2017-10-15 DIAGNOSIS — I10 Essential (primary) hypertension: Secondary | ICD-10-CM

## 2017-10-15 DIAGNOSIS — J9690 Respiratory failure, unspecified, unspecified whether with hypoxia or hypercapnia: Secondary | ICD-10-CM

## 2017-10-15 DIAGNOSIS — J810 Acute pulmonary edema: Secondary | ICD-10-CM

## 2017-10-15 DIAGNOSIS — J9601 Acute respiratory failure with hypoxia: Secondary | ICD-10-CM

## 2017-10-15 LAB — COMPREHENSIVE METABOLIC PANEL
ALT: 9 U/L — ABNORMAL LOW (ref 17–63)
AST: 23 U/L (ref 15–41)
Albumin: 3.1 g/dL — ABNORMAL LOW (ref 3.5–5.0)
Alkaline Phosphatase: 34 U/L — ABNORMAL LOW (ref 38–126)
Anion gap: 8 (ref 5–15)
BUN: 27 mg/dL — ABNORMAL HIGH (ref 6–20)
CO2: 19 mmol/L — ABNORMAL LOW (ref 22–32)
Calcium: 9.1 mg/dL (ref 8.9–10.3)
Chloride: 111 mmol/L (ref 101–111)
Creatinine, Ser: 2.1 mg/dL — ABNORMAL HIGH (ref 0.61–1.24)
GFR calc Af Amer: 36 mL/min — ABNORMAL LOW (ref >60)
GFR calc non Af Amer: 31 mL/min — ABNORMAL LOW (ref >60)
Glucose, Bld: 139 mg/dL — ABNORMAL HIGH (ref 65–99)
Potassium: 5.2 mmol/L — ABNORMAL HIGH (ref 3.5–5.1)
Sodium: 138 mmol/L (ref 135–145)
Total Bilirubin: 1.1 mg/dL (ref 0.3–1.2)
Total Protein: 4.8 g/dL — ABNORMAL LOW (ref 6.5–8.1)

## 2017-10-15 LAB — POCT I-STAT 3, ART BLOOD GAS (G3+)
Acid-base deficit: 12 mmol/L — ABNORMAL HIGH (ref 0.0–2.0)
Acid-base deficit: 2 mmol/L (ref 0.0–2.0)
Bicarbonate: 11.6 mmol/L — ABNORMAL LOW (ref 20.0–28.0)
Bicarbonate: 22.1 mmol/L (ref 20.0–28.0)
O2 Saturation: 96 %
O2 Saturation: 99 %
Patient temperature: 99.9
Patient temperature: 100.1
TCO2: 12 mmol/L — ABNORMAL LOW (ref 22–32)
TCO2: 23 mmol/L (ref 22–32)
pCO2 arterial: 16.3 mmHg — CL (ref 32.0–48.0)
pCO2 arterial: 36.9 mmHg (ref 32.0–48.0)
pH, Arterial: 7.463 — ABNORMAL HIGH (ref 7.350–7.450)
pH, Arterial: 7.389 (ref 7.350–7.450)
pO2, Arterial: 77 mmHg — ABNORMAL LOW (ref 83.0–108.0)
pO2, Arterial: 119 mmHg — ABNORMAL HIGH (ref 83.0–108.0)

## 2017-10-15 LAB — POCT I-STAT 7, (LYTES, BLD GAS, ICA,H+H)
Acid-base deficit: 4 mmol/L — ABNORMAL HIGH (ref 0.0–2.0)
Acid-base deficit: 5 mmol/L — ABNORMAL HIGH (ref 0.0–2.0)
Acid-base deficit: 6 mmol/L — ABNORMAL HIGH (ref 0.0–2.0)
Bicarbonate: 23.8 mmol/L (ref 20.0–28.0)
Bicarbonate: 21.4 mmol/L (ref 20.0–28.0)
Bicarbonate: 21.6 mmol/L (ref 20.0–28.0)
Calcium, Ion: 1.32 mmol/L (ref 1.15–1.40)
Calcium, Ion: 1.19 mmol/L (ref 1.15–1.40)
Calcium, Ion: 1.37 mmol/L (ref 1.15–1.40)
HCT: 33 % — ABNORMAL LOW (ref 39.0–52.0)
HCT: 28 % — ABNORMAL LOW (ref 39.0–52.0)
HCT: 27 % — ABNORMAL LOW (ref 39.0–52.0)
Hemoglobin: 11.2 g/dL — ABNORMAL LOW (ref 13.0–17.0)
Hemoglobin: 9.5 g/dL — ABNORMAL LOW (ref 13.0–17.0)
Hemoglobin: 9.2 g/dL — ABNORMAL LOW (ref 13.0–17.0)
O2 Saturation: 98 %
O2 Saturation: 97 %
O2 Saturation: 100 %
Patient temperature: 36.3
Patient temperature: 36.6
Patient temperature: 36.3
Potassium: 4.7 mmol/L (ref 3.5–5.1)
Potassium: 4.6 mmol/L (ref 3.5–5.1)
Potassium: 5 mmol/L (ref 3.5–5.1)
Sodium: 140 mmol/L (ref 135–145)
Sodium: 139 mmol/L (ref 135–145)
Sodium: 140 mmol/L (ref 135–145)
TCO2: 25 mmol/L (ref 22–32)
TCO2: 23 mmol/L (ref 22–32)
TCO2: 23 mmol/L (ref 22–32)
pCO2 arterial: 53.2 mmHg — ABNORMAL HIGH (ref 32.0–48.0)
pCO2 arterial: 43.7 mmHg (ref 32.0–48.0)
pCO2 arterial: 50.4 mmHg — ABNORMAL HIGH (ref 32.0–48.0)
pH, Arterial: 7.254 — ABNORMAL LOW (ref 7.350–7.450)
pH, Arterial: 7.295 — ABNORMAL LOW (ref 7.350–7.450)
pH, Arterial: 7.236 — ABNORMAL LOW (ref 7.350–7.450)
pO2, Arterial: 130 mmHg — ABNORMAL HIGH (ref 83.0–108.0)
pO2, Arterial: 94 mmHg (ref 83.0–108.0)
pO2, Arterial: 344 mmHg — ABNORMAL HIGH (ref 83.0–108.0)

## 2017-10-15 LAB — BPAM FFP
Blood Product Expiration Date: 201812022359
Blood Product Expiration Date: 201812022359
ISSUE DATE / TIME: 201811272005
ISSUE DATE / TIME: 201811272228
Unit Type and Rh: 8400
Unit Type and Rh: 8400

## 2017-10-15 LAB — GLUCOSE, CAPILLARY
Glucose-Capillary: 112 mg/dL — ABNORMAL HIGH (ref 65–99)
Glucose-Capillary: 116 mg/dL — ABNORMAL HIGH (ref 65–99)
Glucose-Capillary: 136 mg/dL — ABNORMAL HIGH (ref 65–99)
Glucose-Capillary: 96 mg/dL (ref 65–99)

## 2017-10-15 LAB — HEMOGLOBIN AND HEMATOCRIT, BLOOD
HCT: 24.7 % — ABNORMAL LOW (ref 39.0–52.0)
Hemoglobin: 8.4 g/dL — ABNORMAL LOW (ref 13.0–17.0)

## 2017-10-15 LAB — CBC
HCT: 27.7 % — ABNORMAL LOW (ref 39.0–52.0)
Hemoglobin: 9.4 g/dL — ABNORMAL LOW (ref 13.0–17.0)
MCH: 30.2 pg (ref 26.0–34.0)
MCHC: 33.9 g/dL (ref 30.0–36.0)
MCV: 89.1 fL (ref 78.0–100.0)
Platelets: 207 10*3/uL (ref 150–400)
RBC: 3.11 MIL/uL — ABNORMAL LOW (ref 4.22–5.81)
RDW: 13.6 % (ref 11.5–15.5)
WBC: 17.4 10*3/uL — ABNORMAL HIGH (ref 4.0–10.5)

## 2017-10-15 LAB — PREPARE FRESH FROZEN PLASMA
Unit division: 0
Unit division: 0

## 2017-10-15 LAB — PREPARE RBC (CROSSMATCH)

## 2017-10-15 LAB — MAGNESIUM: Magnesium: 1.5 mg/dL — ABNORMAL LOW (ref 1.7–2.4)

## 2017-10-15 LAB — AMYLASE: Amylase: 46 U/L (ref 28–100)

## 2017-10-15 LAB — TROPONIN I
Troponin I: 0.05 ng/mL (ref <0.03)
Troponin I: 0.05 ng/mL (ref <0.03)

## 2017-10-15 LAB — POCT ACTIVATED CLOTTING TIME: Activated Clotting Time: 202 seconds

## 2017-10-15 MED ORDER — DOCUSATE SODIUM 50 MG/5ML PO LIQD
100.0000 mg | Freq: Every day | ORAL | Status: DC
  Administered 2017-10-15 – 2017-10-17 (×3): 100 mg
  Filled 2017-10-15 (×3): qty 10

## 2017-10-15 MED ORDER — SODIUM CHLORIDE 0.9 % IV SOLN
Freq: Once | INTRAVENOUS | Status: AC
  Administered 2017-10-15: 13:00:00 via INTRAVENOUS

## 2017-10-15 MED ORDER — FENTANYL CITRATE (PF) 100 MCG/2ML IJ SOLN
25.0000 ug | INTRAMUSCULAR | Status: DC | PRN
  Administered 2017-10-15 (×2): 25 ug via INTRAVENOUS
  Administered 2017-10-15: 50 ug via INTRAVENOUS
  Administered 2017-10-15: 25 ug via INTRAVENOUS
  Administered 2017-10-15: 50 ug via INTRAVENOUS
  Filled 2017-10-15 (×2): qty 2

## 2017-10-15 MED ORDER — PNEUMOCOCCAL VAC POLYVALENT 25 MCG/0.5ML IJ INJ: 0.5000 mL | INJECTION | INTRAMUSCULAR | Status: DC

## 2017-10-15 MED ORDER — FENTANYL CITRATE (PF) 100 MCG/2ML IJ SOLN
25.0000 ug | INTRAMUSCULAR | Status: DC | PRN
  Administered 2017-10-15 – 2017-10-16 (×3): 100 ug via INTRAVENOUS
  Filled 2017-10-15 (×5): qty 2

## 2017-10-15 MED ORDER — SODIUM CHLORIDE 0.9 % IV SOLN
0.0000 ug/min | INTRAVENOUS | Status: DC
  Administered 2017-10-15: 80 ug/min via INTRAVENOUS
  Filled 2017-10-15: qty 4

## 2017-10-15 MED ORDER — ORAL CARE MOUTH RINSE
15.0000 mL | Freq: Two times a day (BID) | OROMUCOSAL | Status: DC
  Administered 2017-10-15 – 2017-10-19 (×7): 15 mL via OROMUCOSAL

## 2017-10-15 MED ORDER — STERILE WATER FOR INJECTION IV SOLN
INTRAVENOUS | Status: DC
  Administered 2017-10-15 – 2017-10-16 (×2): via INTRAVENOUS
  Filled 2017-10-15 (×3): qty 850

## 2017-10-15 MED ORDER — MAGNESIUM SULFATE 2 GM/50ML IV SOLN
2.0000 g | Freq: Once | INTRAVENOUS | Status: AC
  Administered 2017-10-15: 2 g via INTRAVENOUS
  Filled 2017-10-15: qty 50

## 2017-10-15 MED FILL — Sodium Chloride IV Soln 0.9%: INTRAVENOUS | Qty: 2000 | Status: AC

## 2017-10-15 MED FILL — Heparin Sodium (Porcine) Inj 1000 Unit/ML: INTRAMUSCULAR | Qty: 30 | Status: AC

## 2017-10-15 NOTE — Progress Notes (Signed)
CRITICAL VALUE ALERT  Critical Value:  Troponin 0.05  Date & Time Notied:  10/15/2017, 0230  Provider Notified: Garrison [place]  Orders Received/Actions taken: none, serial troponins already ordered

## 2017-10-15 NOTE — Progress Notes (Signed)
PT Cancellation Note  Patient Details Name: Dwayne Cuevas MRN: [id number] DOB: 03-15-1951   Cancelled Treatment:    Reason Eval/Treat Not Completed: Patient not medically ready. Per RN, pt extubated at 12pm. Will follow-up for PT evaluation later this afternoon as time allows.  Mabeline Caras, PT, DPT Acute Rehab Services  Pager: [phone] 10/15/2017, 1:25 PM

## 2017-10-15 NOTE — Progress Notes (Signed)
Dr. [name] aware of patient's report of severe abdominal pain.  93mcg of fentanyl given- will continue to monitor.

## 2017-10-15 NOTE — Progress Notes (Signed)
Clinical Impression: PTA, pt was independent with ADL and functional mobility. He presents with post-operative pain in B LE as well as abdomen impacting his independence and safety with ADL and functional mobility. Pt was able to sit at EOB for oral care tasks with min guard assist for balance and set-up to swab his mouth. Noted some confusion this session during conversation and feel this is likely due to medications. Feel pt will progress well acutely and do not anticipate need for OT follow-up post-acute D/C. However, pt would benefit from continued OT services while admitted to improve independence with ADL and functional mobility.    10/15/17 1630  OT Visit Information  Last OT Received On 10/15/17  Assistance Needed +2 (safety/lines)  PT/OT/SLP Co-Evaluation/Treatment Yes  Reason for Co-Treatment For patient/therapist safety;To address functional/ADL transfers  OT goals addressed during session ADL's and self-care  History of Present Illness Pt is a 66 y.o. male admitted on 10/14/17 with an abdominal aortic aneurysm and aortoiliac occlusive disease. Pt now s/p aortobifemoral bypass, R femoral below knee popliteal bypass, and L femoral embolectomy on 10/14/17. Pertinent PMH includes scoliosis, PE, HTN, gout, DVT, CAD, COPD.  Restrictions  Weight Bearing Restrictions No  Home Living  Family/patient expects to be discharged to: Private residence  Living Arrangements Alone  Available Help at Discharge (reports a nurse will be coming to his house)  Type of [place] to enter  Entrance Stairs-Number of Steps 2 flights  Entrance Stairs-Rails (first flight both; second set on the R)  Home Layout One level  Bathroom Shower/Tub Tub/shower [employer] - single point  Prior Function  Level of Independence Independent  Comments Does not work; enjoys reading, watching TV, video games, drives.   Communication  Communication No difficulties  Pain  Assessment  Pain Assessment 0-10  Pain Score 5  Pain Location R calf  Pain Descriptors / Indicators Discomfort;Grimacing  Pain Intervention(s) Limited activity within patient's tolerance;Monitored during session;Repositioned  Cognition  Arousal/Alertness Lethargic  Behavior During Therapy WFL for tasks assessed/performed  Overall Cognitive Status Impaired/Different from baseline  Area of Impairment Awareness;Problem solving  Awareness Emergent  Problem Solving Slow processing  General Comments Pt slow to answer questions and confused at times answering a question he was not asked. Likely due to medications.   Upper Extremity Assessment  Upper Extremity Assessment Overall WFL for tasks assessed  Lower Extremity Assessment  Lower Extremity Assessment Overall WFL for tasks assessed  ADL  Overall ADL's  Needs assistance/impaired  Eating/Feeding NPO  Grooming Set up;Sitting;Oral care  Grooming Details (indicate cue type and reason) min guard assist for balance  Upper Body Bathing Minimal assistance;Sitting  Lower Body Bathing Total assistance;Sitting/lateral leans  Upper Body Dressing  Minimal assistance;Sitting  Lower Body Dressing Total assistance;Sitting/lateral leans  General ADL Comments Pt able to tolerate oral care tasks at EOB. Very painful in abdomen and B LE (R>L).  Vision- History  Baseline Vision/History Wears glasses  Wears Glasses At all times  Patient Visual Report Blurring of vision  Vision- Assessment  Vision Assessment? Vision impaired- to be further tested in functional context  Additional Comments Reports blurring of vision.   Bed Mobility  Overal bed mobility Needs Assistance  Bed Mobility Rolling;Sidelying to Sit;Sit to Sidelying  Rolling Mod assist  Sidelying to sit Max assist  Sit to sidelying +2 for physical assistance;Max assist  General bed mobility comments Max assist to manage B LE and trunk secondary to pain.  Transfers  General transfer comment  Not tested due to pain  Balance  Overall balance assessment Needs assistance  Sitting-balance support No upper extremity supported;Feet supported  Sitting balance-Leahy Scale Fair  General Comments  General comments (skin integrity, edema, etc.) Resting vitals: BP 124/61, HR 102, SpO2 100% on 2L O2; Seated at EOB: BP 139/56, 25-30 RR, HR 109   OT - End of Session  Equipment Utilized During Treatment Gait belt;Rolling walker  Activity Tolerance Patient limited by pain  Patient left in bed;with call bell/phone within reach;with bed alarm set  Nurse Communication Mobility status  OT Assessment  OT Recommendation/Assessment Patient needs continued OT Services  OT Visit Diagnosis Other abnormalities of gait and mobility (R26.89);Pain  Pain - Right/Left Right (abdominal)  Pain - part of body Leg (B LE and abdominal)  OT Problem List Decreased strength;Decreased activity tolerance;Impaired balance (sitting and/or standing);Decreased safety awareness;Decreased knowledge of use of DME or AE;Decreased knowledge of precautions;Pain  OT Plan  OT Frequency (ACUTE ONLY) Min 2X/week  OT Treatment/Interventions (ACUTE ONLY) Self-care/ADL training;Therapeutic exercise;Energy conservation;DME and/or AE instruction;Therapeutic activities;Patient/family education;Balance training  AM-PAC OT "6 Clicks" Daily Activity Outcome Measure  Help from another person eating meals? 1  Help from another person taking care of personal grooming? 3  Help from another person toileting, which includes using toliet, bedpan, or urinal? 2  Help from another person bathing (including washing, rinsing, drying)? 1  Help from another person to put on and taking off regular upper body clothing? 3  Help from another person to put on and taking off regular lower body clothing? 1  6 Click Score 11  ADL G Code Conversion CL  OT Recommendation  Follow Up Recommendations No OT follow up;Supervision/Assistance - 24 hour  OT Equipment  3 in 1 bedside commode  Individuals Consulted  Consulted and Agree with Results and Recommendations Patient  Acute Rehab OT Goals  Patient Stated Goal less pain  OT Goal Formulation With patient  Time For Goal Achievement 10/29/17  Potential to Achieve Goals Good  OT Time Calculation  OT Start Time (ACUTE ONLY) 1638  OT Stop Time (ACUTE ONLY) 1709  OT Time Calculation (min) 31 min  OT General Charges  $OT Visit 1 Visit  OT Evaluation  $OT Eval Moderate Complexity 1 Mod   Norman Herrlich, MS OTR/L  Pager: [phone]

## 2017-10-15 NOTE — Progress Notes (Addendum)
[facility] [place]  Subjective  - Intubated and sedated.   Objective (!) 109/55 88 100.1 F (37.8 C) (Axillary) (!) 21 100%  Intake/Output Summary (Last 24 hours) at 10/15/2017 0943 Last data filed at 10/15/2017 0900 Gross per 24 hour  Intake 10929.23 ml  Output 3355 ml  Net 7574.23 ml    Doppler DP left foot color normal, right doppler DP/PT brisk Groins soft, Abdomen with hypo bowel sounds NG out put 250 total OU 30-60 ml/hr Heart RRR    Assessment/Planning: POD# 1  Versed for sedation, Fentanyl for pain Cr 2.10 continue fluid support Neo pressure for hypotension weaning started this am Heparin SQ for DVT Prophylaxis  Acute respiratory failure remains intubated HGB stable 9.4.  Intraoperative 2 units PRBC and 2 units FFP Hypomagnesemia replaced, hyperkalemia    Roxy Horseman 10/15/2017 9:43 AM --  Laboratory Lab Results: Recent Labs    10/14/17 1753 10/15/17 0434  WBC 16.4* 17.4*  HGB 9.9* 9.4*  HCT 29.0* 27.7*  PLT 173 207   BMET Recent Labs    10/14/17 1753 10/15/17 0434  NA 136 138  K 5.8* 5.2*  CL 111 111  CO2 19* 19*  GLUCOSE 201* 139*  BUN 23* 27*  CREATININE 1.79* 2.10*  CALCIUM 9.7 9.1    COAG Lab Results  Component Value Date   INR 1.37 10/14/2017   INR 1.03 10/07/2017   INR 1.11 02/07/2016   No results found for: PTT  Addendum  I have independently interviewed and examined the patient, and I agree with the physician assistant's findings.  Pt remains intubated and sedated.  Vaso off, Neo drip titrating down.   CBC CBC Latest Ref Rng & Units 10/15/2017 10/15/2017 10/14/2017  WBC 4.0 - 10.5 K/uL - 17.4(H) 16.4(H)  Hemoglobin 13.0 - 17.0 g/dL 8.4(L) 9.4(L) 9.9(L)  Hematocrit 39.0 - 52.0 % 24.7(L) 27.7(L) 29.0(L)  Platelets 150 - 400 K/uL - 207 173   Downward drift of H/H likely related to resuscitation.  L foot with AT signal today.  - unclear cause of sudden drop in cardiac function  intraop.  Pt had hypotension intraop not responsive to aortic graft crossclamping. - Post-op cardiac studies relatively normal - Will get L leg arterial duplex tomorrow to look for any residual embolism in tibial arteries - Transfuse pRBC to help titration of Neo gtt - Acute renal failure likely due to ATN due to hypotension: UOP remains acceptable, pt baseline has some CRI  Adele Barthel, MD, FACS [facility] [place] Office: [phone] Pager: [phone]  10/15/2017, 0720   Addendum  Talked with patient about intraop events.  Pt very drowsy at this point.  L foot somewhat cyanotic appearing but PT and AT dopplerable.  - LLE arterial duplex and BLE ABI tomorrow  Adele Barthel, MD, FACS [facility] [place] Office: [phone] Pager: [phone]  10/15/2017, 5:12 PM

## 2017-10-15 NOTE — Evaluation (Signed)
Physical Therapy Evaluation Patient Details Name: Dwayne Cuevas MRN: [id number] DOB: [birth year] Today's Date: 10/15/2017   History of Present Illness  Pt is a 66 y.o. male admitted on 10/14/17 with an abdominal aortic aneurysm and aortoiliac occlusive disease. Pt now s/p aortobifemoral bypass, R femoral below knee popliteal bypass, and L femoral embolectomy on 10/14/17. Pertinent PMH includes scoliosis, PE, HTN, gout, DVT, CAD, COPD.    Clinical Impression  Pt presents with significant pain and an overall decrease in functional mobility secondary to above. PTA, pt indep from home and lives alone. Mod-maxA for bed mobility secondary to increased pain with all movement; able to sit EOB with min guard. Feel that once pain is under control, pt will be moving well and will not require follow-up PT services. Pt would benefit from continued acute PT services to maximize functional mobility and independence prior to d/c home.      Follow Up Recommendations No PT follow up;Supervision - Intermittent    Equipment Recommendations  Other (comment)    Recommendations for Other Services       Precautions / Restrictions Precautions Precautions: Fall Restrictions Weight Bearing Restrictions: No      Mobility  Bed Mobility Overal bed mobility: Needs Assistance Bed Mobility: Rolling;Sidelying to Sit;Sit to Sidelying Rolling: Mod assist Sidelying to sit: Max assist     Sit to sidelying: +2 for physical assistance;Max assist General bed mobility comments: Max assist to manage B LE and trunk secondary to pain.   Transfers                 General transfer comment: Not tested due to pain  Ambulation/Gait                Stairs            Wheelchair Mobility    Modified Rankin (Stroke Patients Only)       Balance Overall balance assessment: Needs assistance Sitting-balance support: No upper extremity supported;Feet supported Sitting balance-Leahy Scale: Fair                                        Pertinent Vitals/Pain Pain Assessment: 0-10 Pain Score: 5  Pain Location: R calf Pain Descriptors / Indicators: Discomfort;Grimacing Pain Intervention(s): Limited activity within patient's tolerance;Monitored during session;Repositioned    Home Living Family/patient expects to be discharged to:: Private residence Living Arrangements: Alone Available Help at Discharge: (reports a nurse will be coming to his house) Type of Home: Apartment Home Access: Stairs to enter Entrance Stairs-Rails: Right;Left Entrance Stairs-Number of Steps: 2 flights Home Layout: One level Home Equipment: Cane - single point      Prior Function Level of Independence: Independent         Comments: Does not work; enjoys reading, watching TV, video games, drives.      Hand Dominance        Extremity/Trunk Assessment   Upper Extremity Assessment Upper Extremity Assessment: Overall WFL for tasks assessed    Lower Extremity Assessment Lower Extremity Assessment: Overall WFL for tasks assessed(limited by pain)       Communication   Communication: No difficulties  Cognition Arousal/Alertness: Lethargic Behavior During Therapy: WFL for tasks assessed/performed Overall Cognitive Status: Impaired/Different from baseline Area of Impairment: Awareness;Problem solving  Awareness: Emergent Problem Solving: Slow processing General Comments: Pt slow to answer questions and confused at times answering a question he was not asked. Likely due to medications.       General Comments General comments (skin integrity, edema, etc.): Resting vitals: BP 124/61, HR 102, SpO2 100% on 2L O2 [name]. Seated at EOB: BP 139/56, HR 109, RR 25-30    Exercises     Assessment/Plan    PT Assessment Patient needs continued PT services  PT Problem List Decreased activity tolerance;Decreased balance;Decreased mobility;Decreased  knowledge of use of DME;Cardiopulmonary status limiting activity;Pain       PT Treatment Interventions DME instruction;Gait training;Stair training;Functional mobility training;Therapeutic activities;Therapeutic exercise;Balance training;Patient/family education    PT Goals (Current goals can be found in the Care Plan section)  Acute Rehab PT Goals Patient Stated Goal: less pain PT Goal Formulation: With patient Time For Goal Achievement: 10/29/17 Potential to Achieve Goals: Good    Frequency Min 3X/week   Barriers to discharge Decreased caregiver support      Co-evaluation PT/OT/SLP Co-Evaluation/Treatment: Yes Reason for Co-Treatment: For patient/therapist safety;To address functional/ADL transfers PT goals addressed during session: Mobility/safety with mobility         AM-PAC PT "6 Clicks" Daily Activity  Outcome Measure Difficulty turning over in bed (including adjusting bedclothes, sheets and blankets)?: Unable Difficulty moving from lying on back to sitting on the side of the bed? : Unable Difficulty sitting down on and standing up from a chair with arms (e.g., wheelchair, bedside commode, etc,.)?: Unable Help needed moving to and from a bed to chair (including a wheelchair)?: A Little Help needed walking in hospital room?: A Lot Help needed climbing 3-5 steps with a railing? : A Lot 6 Click Score: 10    End of Session Equipment Utilized During Treatment: Oxygen Activity Tolerance: Patient limited by pain Patient left: in bed;with call bell/phone within reach;with bed alarm set Nurse Communication: Mobility status PT Visit Diagnosis: Other abnormalities of gait and mobility (R26.89);Pain Pain - part of body: Leg(BLE, abdomen)    Time: 4166-0630 PT Time Calculation (min) (ACUTE ONLY): 30 min   Charges:   PT Evaluation $PT Eval Moderate Complexity: 1 Mod     PT G Codes:       Mabeline Caras, PT, DPT Acute Rehab Services  Pager: [place] 10/15/2017, 5:53 PM

## 2017-10-15 NOTE — Progress Notes (Signed)
PULMONARY / CRITICAL CARE MEDICINE   Name: Dwayne Cuevas MRN: [id number] DOB: 1951-10-07    ADMISSION DATE:  10/14/2017 CONSULTATION DATE:  12/14/16  REFERRING MD:  Dwayne. Dwayne Cuevas  CHIEF COMPLAINT:  Hypotension  HISTORY OF PRESENT ILLNESS:   History is obtained from the chart and from my discussions with Dwayne. Dwayne Cuevas.   Dwayne Cuevas is a 66 y.o. (May 25, 1951) male who presents with chief complaint: R leg rest pain.  The patient underwent angiography on 09/04/17 which demonstrated occluded R iliac arterial system, common femoral artery and superficial femoral artery with heavily calcified and stenosis L CIA.  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   L CIA was stented to preserve L iliac arterial flow in the event his cardiac risk stratification was high risk.  The patient has been cleared for open surgery at this point by his cardiologist.  Patient notes numbness in his right foot is getting worse.  We were asked to see the patient acutely in the post operative settting for the development of hypotension intra operatively, requiring vasopressor support. Currently he is in the Cardiac ICU, intubated, sedated and on vasopressor support.  SUBJECTIVE:  On 61mcg/min of neo, being weaned down.  Starting PS wean this AM, tolerating 5/5 currently but slightly agitated due to abdominal pain.   VITAL SIGNS: BP (!) 124/56   Pulse 83   Temp 100.1 F (37.8 C) (Axillary)   Resp (!) 24   Ht 6' (1.829 m)   Wt 83.5 kg (184 lb)   SpO2 100%   BMI 24.95 kg/m   HEMODYNAMICS: CVP:  [5 mmHg-15 mmHg] 5 mmHg  VENTILATOR SETTINGS: Vent Mode: PRVC FiO2 (%):  [40 %-100 %] 40 % Set Rate:  [24 bmp] 24 bmp Vt Set:  [951 mL] 620 mL PEEP:  [5 cmH20] 5 cmH20 Plateau Pressure:  [17 cmH20-18 cmH20] 17 cmH20  INTAKE / OUTPUT: I/O last 3 completed shifts: In: 10774.3 [I.V.:7165.8; Blood:1898.5; NG/GT:250; IV Piggyback:1460] Out: 8841 [Urine:960; Emesis/NG output:50; Blood:2000]  PHYSICAL EXAMINATION: General:   Intubated,sedated. Some agitation. Neuro:  Rass of -+1.  No focal deficits. HEENT:  [place] / AT.  ETT, OGT. Cardiovascular:  RRR, no M/R/G. Lungs:  CTAB. Abdomen:  Midline bandaged surgical incision. Flat, tender to palpation. Musculoskeletal:  Bilateral femoral, calve surgical incisions. No bleeding from the sites. Left radial artery catheter. Skin:  Warm, dry.  LABS:  BMET Recent Labs  Lab 10/14/17 1753 10/15/17 0434  NA 136 138  K 5.8* 5.2*  CL 111 111  CO2 19* 19*  BUN 23* 27*  CREATININE 1.79* 2.10*  GLUCOSE 201* 139*    Electrolytes Recent Labs  Lab 10/14/17 1753 10/15/17 0434  CALCIUM 9.7 9.1  MG 1.0* 1.5*    CBC Recent Labs  Lab 10/14/17 1753 10/15/17 0434  WBC 16.4* 17.4*  HGB 9.9* 9.4*  HCT 29.0* 27.7*  PLT 173 207    Coag's Recent Labs  Lab 10/14/17 1753  APTT 74*  INR 1.37    Sepsis Markers Recent Labs  Lab 10/14/17 1816  LATICACIDVEN 2.6*    ABG Recent Labs  Lab 10/14/17 1805  PHART 7.308*  PCO2ART 46.2  PO2ART 526.0*    Liver Enzymes Recent Labs  Lab 10/15/17 0434  AST 23  ALT 9*  ALKPHOS 34*  BILITOT 1.1  ALBUMIN 3.1*    Cardiac Enzymes Recent Labs  Lab 10/14/17 1753 10/15/17 0108 10/15/17 0434  TROPONINI <0.03 0.05* 0.05*    Glucose Recent Labs  Lab 10/14/17  1805 10/14/17 2113 10/14/17 2330 10/15/17 0438  GLUCAP 199* 180* 201* 136*    Imaging Dg Chest Port 1 View  Result Date: 10/14/2017 CLINICAL DATA:  Evaluate endotracheal tube placement. Central line placement. EXAM: PORTABLE CHEST 1 VIEW COMPARISON:  Chest CT 02/05/2016 FINDINGS: There is an endotracheal tube with tip at the clavicular heads. An orogastric tube reaches the stomach at least. Right IJ central line with tip at the upper SVC. Indistinct hila which is likely atelectasis. No Kerley lines, air bronchogram, effusion, or pneumothorax. Normal heart size and mediastinal contours. IMPRESSION: Unremarkable positioning of tubes and lines as  described. Mild perihilar atelectasis. Electronically Signed   By: Dwayne Cuevas M.D.   On: 10/14/2017 19:59   Dg Ang/ext/uni/or Left  Result Date: 10/14/2017 CLINICAL DATA:  Aortobifemoral bypass EXAM: LEFT ANG/EXT/UNI/ OR COMPARISON:  None. FINDINGS: Contrast fills the distal superficial femoral artery and popliteal artery. There is a focal occlusion of the popliteal artery just above the knee. The popliteal artery reconstitutes just below the occlusion. There is poor filling of the tibial vessels. IMPRESSION: See above. Electronically Signed   By: Dwayne Cuevas M.D.   On: 10/14/2017 17:24   Dg Abd Portable 1v  Result Date: 10/14/2017 CLINICAL DATA:  Initial evaluation for NG tube placement. EXAM: PORTABLE ABDOMEN - 1 VIEW COMPARISON:  Prior CT from 05/09/2016. FINDINGS: NG tube in place with tip in side hole overlying the stomach, well beyond the GE junction. Tip projects distally. Skin staples overlie the mid abdomen and pelvis. Bowel gas pattern within normal limits without obstruction or ileus. No appreciable free air on this limited supine view the abdomen. Visualized lung bases are clear. IMPRESSION: Tip and side hole of enteric tube overlying the stomach, well beyond the GE junction. Tip projects distally. Electronically Signed   By: Dwayne Cuevas M.D.   On: 10/14/2017 20:02     STUDIES:  KUB 11/27 > non specific gas pattern, midline surgical staples, and a tube in the distal portion of the stomach. CXR 11/27 > tt tip approximately at the arch of the aorta, and in the trachea. Right IJ catheter in proper position. No pulmonary edema/opacities. No pneumothorax. Cardiac silhouette appears unremarkable.  Echo 11/27 > EF 55-60%, G1DD.   CULTURES: None     ANTIBIOTICS: none  SIGNIFICANT EVENTS: 11/27 > Post operative hypotension.  LINES/TUBES: ETT 11/27 Right IJ 11/27 Left arterial line, radial 11/27  DISCUSSION: 66 y.o. Male with postoperative hypotension requiring  vasopressor support.  ASSESSMENT / PLAN:  PULMONARY A:  Acute postoperative respiratory failure - on vent. P:   Starting PS wean this AM with hopeful extubation. Bronchial hygiene. Follow CXR.  CARDIOVASCULAR A:  Hypotension - weaning neosynephrine as able.  Echo with EF 55-60%, mild G1DD.  Had preoperative cardiac clearance. H/O coronary calcifications, normal nuclear stress test 2017. Cleared preoperatively for surgery. Sinus brady - felt to be due to precedex.  Resolved off precedex. P:  Hold all antihypertensives (d/c'd scheduled enalapril, HCTZ). Cardiology following. Continue neosynephrine and wean as tolerated. Maintain MAP of at least 60-65 mm Hg. Continue to avoid precedex.  RENAL A:  Mild hyperkalemia. AKI. NAGMA. Hypomagnesemia. P:   D/c NS. HCO3 @ 75cc/hr. Correct electrolytes as indicated. Follow BMP.  GASTROINTESTINAL A:   GI prophylaxis. P:   PPI for stress prophylaxis.  HEMATOLOGIC A:   Acute postoperative blood loss. P:  Monitor H/H, transfuse if Hgb < 8gms giving his risk for myocardial ischemia.  INFECTIOUS A:   No indication  of infection. P:   Monitor clinically.  ENDOCRINE A:  No acute issues.   P:  No interventions required.  NEUROLOGIC A:   Sedation for mechanical ventilation. P:   Sedation: versed / fentanyl RASS goal: 0 to -1. Daily WUA.    FAMILY  - Updates: family not available at this time.  - Inter-disciplinary family meet or Palliative Care meeting due by:  day 7   CC time: 30 min.   Dwayne Cuevas, [place] [facility] Pager: [phone]  or [phone] 10/15/2017, 8:45 AM  Attending Note:  66 year old male s/p vascular procedure that was left intubated post op and is now weaning with clear lungs on exam but lethargic.  I reviewed CXR myself, ETT is in good position and no acute disease noted.  Discussed with PCCM-NP.  Will proceed with extubation once more awake.   Titrate O2 for sat of 88-92%.  Ambulate as able.  Hold off diet for 4 hours then may start.  PCCM will follow.  The patient is critically ill with multiple organ systems failure and requires high complexity decision making for assessment and support, frequent evaluation and titration of therapies, application of advanced monitoring technologies and extensive interpretation of multiple databases.   Critical Care Time devoted to patient care services described in this note is  35  Minutes. This time reflects time of care of this signee Dwayne Cuevas. This critical care time does not reflect procedure time, or teaching time or supervisory time of PA/NP/Med student/Med Resident etc but could involve care discussion time.  Dwayne Cuevas, M.D. [facility]. Pager: [phone]. After hours pager: [phone].

## 2017-10-15 NOTE — Procedures (Signed)
Extubation Procedure Note  Patient Details:   Name: Dwayne Cuevas DOB: [birth year] MRN: [id number]   Airway Documentation:     Evaluation  O2 sats: stable throughout Complications: No apparent complications Patient did tolerate procedure well. Bilateral Breath Sounds: Clear, Diminished   Yes   PT was extubated to 4l [place]  PT was able to speak and cough  Sats are stable  RT to monitor   Traye Bates, Leonie Douglas 10/15/2017, 12:01 PM

## 2017-10-15 NOTE — Progress Notes (Signed)
Came to see patient regarding possible change in the appearance of the left foot.  The patient remains intubated and sedated.  He continues to be on Neo-Synephrine and vasopressin.  Reportedly, he was receiving a higher dose of his vasopressin initially.  This is now been corrected.  He reportedly has moved his left foot.  The left foot is cooler than the right.  It is not mottled but is pale in its appearance when compared to the right.  No Doppler signals are obtained at the ankle.  He does have a brisk popliteal signal.  The patient's cardiac workup has been unremarkable.  His initial troponins were within normal limits.  By report his echo was normal.  Patient remains in critical condition and is requiring pressors to maintain adequate blood pressure.  He did receive a higher than expected dose of vasopressin and this could be related to the change in appearance of his left foot.  Currently the foot remains viable although because of his sedation I cannot get an adequate neurologic assessment.  The color suggest that it remains perfused.  Because of the patient's hemodynamic instability, I do not feel he is stable enough to return to the operating room tonight.  We will continue to keep his foot warm and monitor through the night.  Annamarie Major

## 2017-10-15 NOTE — Progress Notes (Signed)
Progress Note  Patient Name: Dwayne Cuevas Date of Encounter: 10/15/2017  Primary Cardiologist: New, Ikhlas Albo   Subjective   66 year old gentleman with history of vascular disease.  He had a peripheral vascular procedure yesterday, gated by hypotension. He seems to be doing much better this morning.  Inpatient Medications    Scheduled Meds: . allopurinol  300 mg Oral QODAY  . aspirin EC  81 mg Oral Daily  . chlorhexidine gluconate (MEDLINE KIT)  15 mL Mouth Rinse BID  . Chlorhexidine Gluconate Cloth  6 each Topical Daily  . docusate  100 mg Per Tube Daily  . heparin subcutaneous  5,000 Units Subcutaneous Q8H  . insulin aspart  0-9 Units Subcutaneous Q4H  . mouth rinse  15 mL Mouth Rinse 10 times per day  . pantoprazole (PROTONIX) IV  40 mg Intravenous Q12H  . pravastatin  40 mg Oral q1800  . sodium chloride flush  10-40 mL Intracatheter Q12H  . vitamin E  100 Units Oral Daily   Continuous Infusions: . clindamycin (CLEOCIN) IV Stopped (10/15/17 6734)  . magnesium sulfate 1 - 4 g bolus IVPB    . magnesium sulfate 1 - 4 g bolus IVPB 2 g (10/15/17 0914)  . midazolam (VERSED) infusion 4 mg/hr (10/15/17 0900)  . phenylephrine (NEO-SYNEPHRINE) Adult infusion 45 mcg/min (10/15/17 0905)  .  sodium bicarbonate (isotonic) infusion in sterile water 75 mL/hr at 10/15/17 0929   PRN Meds: acetaminophen **OR** acetaminophen, alum & mag hydroxide-simeth, fentaNYL (SUBLIMAZE) injection, fentaNYL (SUBLIMAZE) injection, guaiFENesin-dextromethorphan, hydrALAZINE, labetalol, magnesium sulfate 1 - 4 g bolus IVPB, metoprolol tartrate, midazolam, ondansetron, oxyCODONE-acetaminophen, phenol, potassium chloride, sodium chloride flush   Vital Signs    Vitals:   10/15/17 0745 10/15/17 0800 10/15/17 0839 10/15/17 0900  BP: (!) 118/55 (!) 124/56 (!) 78/64 (!) 109/55  Pulse: 88 83 89 88  Resp: (!) 24 (!) 24 (!) 29 (!) 21  Temp:  100.1 F (37.8 C)    TempSrc:  Axillary    SpO2: 100% 100% 100%  100%  Weight:      Height:        Intake/Output Summary (Last 24 hours) at 10/15/2017 0950 Last data filed at 10/15/2017 0900 Gross per 24 hour  Intake 10929.23 ml  Output 3355 ml  Net 7574.23 ml   Filed Weights   10/14/17 0757  Weight: 184 lb (83.5 kg)    Telemetry    NSR  - Personally Reviewed  ECG     NSR  - Personally Reviewed  Physical Exam   GEN:  Patient is sedated and on a ventilator. Neck: No JVD Cardiac:  Regular rate S1-S2. Respiratory:  Currently on a vent.[name] Kitchen GI: Soft, nontender, non-distended  MS: No edema; No deformity. Neuro:   Sedated. PsychCarver Fila  Labs    Chemistry Recent Labs  Lab 10/14/17 1753 10/15/17 0434  NA 136 138  K 5.8* 5.2*  CL 111 111  CO2 19* 19*  GLUCOSE 201* 139*  BUN 23* 27*  CREATININE 1.79* 2.10*  CALCIUM 9.7 9.1  PROT  --  4.8*  ALBUMIN  --  3.1*  AST  --  23  ALT  --  9*  ALKPHOS  --  34*  BILITOT  --  1.1  GFRNONAA 38* 31*  GFRAA 44* 36*  ANIONGAP 6 8     Hematology Recent Labs  Lab 10/14/17 1753 10/15/17 0434  WBC 16.4* 17.4*  RBC 3.19* 3.11*  HGB 9.9* 9.4*  HCT 29.0* 27.7*  MCV  90.9 89.1  MCH 31.0 30.2  MCHC 34.1 33.9  RDW 13.0 13.6  PLT 173 207    Cardiac Enzymes Recent Labs  Lab 10/14/17 1753 10/15/17 0108 10/15/17 0434  TROPONINI <0.03 0.05* 0.05*   No results for input(s): TROPIPOC in the last 168 hours.   BNPNo results for input(s): BNP, PROBNP in the last 168 hours.   DDimer No results for input(s): DDIMER in the last 168 hours.   Radiology    Dg Chest Port 1 View  Result Date: 10/15/2017 CLINICAL DATA:  Respiratory difficulties EXAM: PORTABLE CHEST 1 VIEW COMPARISON:  10/14/2017 FINDINGS: Endotracheal tube, NG tube, right jugular venous catheter stable. Upper normal heart size. Bibasilar atelectasis. Mid and upper lungs clear. Normal vascularity. No pneumothorax. IMPRESSION: Bibasilar atelectasis.  Stable support apparatus. Electronically Signed   By: Marybelle Killings M.D.   On:  10/15/2017 08:52   Dg Chest Port 1 View  Result Date: 10/14/2017 CLINICAL DATA:  Evaluate endotracheal tube placement. Central line placement. EXAM: PORTABLE CHEST 1 VIEW COMPARISON:  Chest CT 02/05/2016 FINDINGS: There is an endotracheal tube with tip at the clavicular heads. An orogastric tube reaches the stomach at least. Right IJ central line with tip at the upper SVC. Indistinct hila which is likely atelectasis. No Kerley lines, air bronchogram, effusion, or pneumothorax. Normal heart size and mediastinal contours. IMPRESSION: Unremarkable positioning of tubes and lines as described. Mild perihilar atelectasis. Electronically Signed   By: Monte Fantasia M.D.   On: 10/14/2017 19:59   Dg Ang/ext/uni/or Left  Result Date: 10/14/2017 CLINICAL DATA:  Aortobifemoral bypass EXAM: LEFT ANG/EXT/UNI/ OR COMPARISON:  None. FINDINGS: Contrast fills the distal superficial femoral artery and popliteal artery. There is a focal occlusion of the popliteal artery just above the knee. The popliteal artery reconstitutes just below the occlusion. There is poor filling of the tibial vessels. IMPRESSION: See above. Electronically Signed   By: Marybelle Killings M.D.   On: 10/14/2017 17:24   Dg Abd Portable 1v  Result Date: 10/14/2017 CLINICAL DATA:  Initial evaluation for NG tube placement. EXAM: PORTABLE ABDOMEN - 1 VIEW COMPARISON:  Prior CT from 05/09/2016. FINDINGS: NG tube in place with tip in side hole overlying the stomach, well beyond the GE junction. Tip projects distally. Skin staples overlie the mid abdomen and pelvis. Bowel gas pattern within normal limits without obstruction or ileus. No appreciable free air on this limited supine view the abdomen. Visualized lung bases are clear. IMPRESSION: Tip and side hole of enteric tube overlying the stomach, well beyond the GE junction. Tip projects distally. Electronically Signed   By: Jeannine Boga M.D.   On: 10/14/2017 20:02    Cardiac Studies      Patient Profile     66 y.o. male   Assessment & Plan    1.  Hypotension:   Patient had an echocardiogram last night which reveals normal left ventricular systolic function.  His troponin levels are very minimally elevated but the trend is flat which is not suggestive of an acute coronary syndrome or perioperative myocardial infarction.  His hemoglobin went from 13.1-9.9 yesterday.  I suspect that the hypertension was a combination of anesthesia plus acute blood loss.  His blood pressures gradually improved and we have been able to wean him off some of the pressors.  At present there is no specific cardiac recommendations. We will sign off.  Please call for questions.  For questions or updates, please contact [place] Please consult [email or web address] for contact info  under Cardiology/STEMI.      Signed, Mertie Moores, MD  10/15/2017, 9:50 AM

## 2017-10-15 NOTE — Progress Notes (Signed)
Received pt at 0300am, pt can get agitated at times, gave bolus of versed. Pt labs K-- 5.2 down from last K and Crt 2.1, passed on to day shift nurse to relay to surgeon. Pt is calm and vital signs stable.

## 2017-10-16 ENCOUNTER — Inpatient Hospital Stay (HOSPITAL_COMMUNITY): Payer: Medicare Other

## 2017-10-16 DIAGNOSIS — Z9889 Other specified postprocedural states: Secondary | ICD-10-CM

## 2017-10-16 DIAGNOSIS — E872 Acidosis: Secondary | ICD-10-CM

## 2017-10-16 DIAGNOSIS — M79609 Pain in unspecified limb: Secondary | ICD-10-CM

## 2017-10-16 LAB — TYPE AND SCREEN
ABO/RH(D): AB NEG
Antibody Screen: NEGATIVE
Unit division: 0
Unit division: 0
Unit division: 0
Unit division: 0

## 2017-10-16 LAB — GLUCOSE, CAPILLARY
Glucose-Capillary: 100 mg/dL — ABNORMAL HIGH (ref 65–99)
Glucose-Capillary: 100 mg/dL — ABNORMAL HIGH (ref 65–99)
Glucose-Capillary: 105 mg/dL — ABNORMAL HIGH (ref 65–99)
Glucose-Capillary: 107 mg/dL — ABNORMAL HIGH (ref 65–99)
Glucose-Capillary: 114 mg/dL — ABNORMAL HIGH (ref 65–99)
Glucose-Capillary: 125 mg/dL — ABNORMAL HIGH (ref 65–99)
Glucose-Capillary: 126 mg/dL — ABNORMAL HIGH (ref 65–99)
Glucose-Capillary: 64 mg/dL — ABNORMAL LOW (ref 65–99)
Glucose-Capillary: 74 mg/dL (ref 65–99)

## 2017-10-16 LAB — BPAM RBC
Blood Product Expiration Date: 201812072359
Blood Product Expiration Date: 201812072359
Blood Product Expiration Date: 201812102359
Blood Product Expiration Date: 201812102359
ISSUE DATE / TIME: 201811271530
ISSUE DATE / TIME: 201811271530
ISSUE DATE / TIME: 201811281244
ISSUE DATE / TIME: 201811281540
Unit Type and Rh: 600
Unit Type and Rh: 600
Unit Type and Rh: 600
Unit Type and Rh: 600

## 2017-10-16 LAB — BASIC METABOLIC PANEL
Anion gap: 6 (ref 5–15)
BUN: 30 mg/dL — ABNORMAL HIGH (ref 6–20)
CO2: 28 mmol/L (ref 22–32)
Calcium: 8.3 mg/dL — ABNORMAL LOW (ref 8.9–10.3)
Chloride: 107 mmol/L (ref 101–111)
Creatinine, Ser: 1.77 mg/dL — ABNORMAL HIGH (ref 0.61–1.24)
GFR calc Af Amer: 45 mL/min — ABNORMAL LOW (ref >60)
GFR calc non Af Amer: 39 mL/min — ABNORMAL LOW (ref >60)
Glucose, Bld: 111 mg/dL — ABNORMAL HIGH (ref 65–99)
Potassium: 4.5 mmol/L (ref 3.5–5.1)
Sodium: 141 mmol/L (ref 135–145)

## 2017-10-16 LAB — HEMOGLOBIN AND HEMATOCRIT, BLOOD
HCT: 25.7 % — ABNORMAL LOW (ref 39.0–52.0)
Hemoglobin: 8.7 g/dL — ABNORMAL LOW (ref 13.0–17.0)

## 2017-10-16 LAB — POCT I-STAT 7, (LYTES, BLD GAS, ICA,H+H)
Acid-base deficit: 3 mmol/L — ABNORMAL HIGH (ref 0.0–2.0)
Bicarbonate: 22.7 mmol/L (ref 20.0–28.0)
Calcium, Ion: 1.13 mmol/L — ABNORMAL LOW (ref 1.15–1.40)
HCT: 27 % — ABNORMAL LOW (ref 39.0–52.0)
Hemoglobin: 9.2 g/dL — ABNORMAL LOW (ref 13.0–17.0)
O2 Saturation: 100 %
Patient temperature: 36.1
Potassium: 4.8 mmol/L (ref 3.5–5.1)
Sodium: 140 mmol/L (ref 135–145)
TCO2: 24 mmol/L (ref 22–32)
pCO2 arterial: 39.4 mmHg (ref 32.0–48.0)
pH, Arterial: 7.366 (ref 7.350–7.450)
pO2, Arterial: 205 mmHg — ABNORMAL HIGH (ref 83.0–108.0)

## 2017-10-16 LAB — MAGNESIUM: Magnesium: 1.6 mg/dL — ABNORMAL LOW (ref 1.7–2.4)

## 2017-10-16 LAB — CBC
HCT: 25.9 % — ABNORMAL LOW (ref 39.0–52.0)
Hemoglobin: 8.9 g/dL — ABNORMAL LOW (ref 13.0–17.0)
MCH: 30.6 pg (ref 26.0–34.0)
MCHC: 34.4 g/dL (ref 30.0–36.0)
MCV: 89 fL (ref 78.0–100.0)
Platelets: 119 10*3/uL — ABNORMAL LOW (ref 150–400)
RBC: 2.91 MIL/uL — ABNORMAL LOW (ref 4.22–5.81)
RDW: 14.7 % (ref 11.5–15.5)
WBC: 10 10*3/uL (ref 4.0–10.5)

## 2017-10-16 LAB — PHOSPHORUS: Phosphorus: 3.4 mg/dL (ref 2.5–4.6)

## 2017-10-16 MED ORDER — SODIUM CHLORIDE 0.9% FLUSH: 9.0000 mL | INTRAVENOUS | Status: DC | PRN

## 2017-10-16 MED ORDER — ONDANSETRON HCL 4 MG/2ML IJ SOLN: 4.0000 mg | Freq: Four times a day (QID) | INTRAMUSCULAR | Status: DC | PRN

## 2017-10-16 MED ORDER — MORPHINE SULFATE 2 MG/ML IV SOLN
INTRAVENOUS | Status: DC
  Administered 2017-10-16: 7.5 mg via INTRAVENOUS
  Administered 2017-10-16: 09:00:00 via INTRAVENOUS
  Administered 2017-10-16: 3 mg via INTRAVENOUS
  Administered 2017-10-16: 9.5 mg via INTRAVENOUS
  Administered 2017-10-17: 05:00:00 via INTRAVENOUS
  Administered 2017-10-17: 0 mg via INTRAVENOUS
  Administered 2017-10-17: 16.5 mg via INTRAVENOUS
  Administered 2017-10-17: 19.5 mg via INTRAVENOUS
  Administered 2017-10-17: 7.5 mg via INTRAVENOUS
  Administered 2017-10-17 (×2): 4.5 mg via INTRAVENOUS
  Administered 2017-10-18: 1.5 mg via INTRAVENOUS
  Administered 2017-10-18 (×2): 3 mg via INTRAVENOUS
  Administered 2017-10-19 (×2): 1.5 mg via INTRAVENOUS
  Filled 2017-10-16 (×2): qty 30

## 2017-10-16 MED ORDER — DIPHENHYDRAMINE HCL 12.5 MG/5ML PO ELIX: 12.5000 mg | ORAL_SOLUTION | Freq: Four times a day (QID) | ORAL | Status: DC | PRN

## 2017-10-16 MED ORDER — DEXTROSE 50 % IV SOLN
INTRAVENOUS | Status: AC
  Filled 2017-10-16: qty 50

## 2017-10-16 MED ORDER — DEXTROSE 50 % IV SOLN
25.0000 mL | Freq: Once | INTRAVENOUS | Status: AC
  Administered 2017-10-16: 25 mL via INTRAVENOUS

## 2017-10-16 MED ORDER — DEXTROSE-NACL 5-0.45 % IV SOLN
INTRAVENOUS | Status: DC
Start: 2017-10-16 — End: 2017-10-19
  Administered 2017-10-16: 125 mL via INTRAVENOUS
  Administered 2017-10-17 – 2017-10-18 (×2): via INTRAVENOUS
  Administered 2017-10-18: 50 mL via INTRAVENOUS

## 2017-10-16 MED ORDER — DIPHENHYDRAMINE HCL 50 MG/ML IJ SOLN: 12.5000 mg | Freq: Four times a day (QID) | INTRAMUSCULAR | Status: DC | PRN

## 2017-10-16 MED ORDER — NALOXONE HCL 0.4 MG/ML IJ SOLN: 0.4000 mg | INTRAMUSCULAR | Status: DC | PRN

## 2017-10-16 MED ORDER — FUROSEMIDE 10 MG/ML IJ SOLN
20.0000 mg | Freq: Two times a day (BID) | INTRAMUSCULAR | Status: DC
  Administered 2017-10-16 – 2017-10-19 (×6): 20 mg via INTRAVENOUS
  Filled 2017-10-16 (×6): qty 2

## 2017-10-16 MED ORDER — MAGNESIUM SULFATE 2 GM/50ML IV SOLN
2.0000 g | Freq: Once | INTRAVENOUS | Status: AC
  Administered 2017-10-16: 2 g via INTRAVENOUS
  Filled 2017-10-16: qty 50

## 2017-10-16 NOTE — Progress Notes (Addendum)
   Daily Progress Note   Assessment/Planning:   POD #2 s/p ABF, R CFA to BK pop BPG complicated by intraop hypotension of unknown etiology leading to ARF (resolving)   Neuro: pain somewhat poorly controlled, will change to PCA  Pulm: IS x 10 q 1 hr  CV: stable, off all pressors  GI: awaiting return of bowel function, check NGT position, possible post-pyloric  FEN: change to MIVF  HEME: clinically no evidence of bleeding, check H/H at 6 PM  REN: improvement in Cr and improved UOP over last 6 hours  VASC: BLE ABI, LLE arterial duplex   Subjective  - 2 Days Post-Op   C/o abd pain   Objective   Vitals:   10/16/17 0300 10/16/17 0400 10/16/17 0500 10/16/17 0600  BP: 134/67 139/65 135/74 138/63  Pulse: 100 (!) 105 97 (!) 104  Resp: (!) 25 (!) 24 (!) 23 (!) 25  Temp: 99 F (37.2 C)     TempSrc: Oral     SpO2: 96% 96% (!) 86% 94%  Weight:      Height:         Intake/Output Summary (Last 24 hours) at 10/16/2017 0727 Last data filed at 10/16/2017 0600 Gross per 24 hour  Intake 2467.3 ml  Output 2455 ml  Net 12.3 ml    PULM  BLL rales  CV  RR, tachy  GI  soft, appropriate TTP, -G/R, bilious NGT  VASC R foot warm with dopplerable signal, L foot more cool but dopplerable AT and PT  NEURO B foot DF/PF intact    Laboratory   CBC CBC Latest Ref Rng & Units 10/16/2017 10/15/2017 10/15/2017  WBC 4.0 - 10.5 K/uL 10.0 - 17.4(H)  Hemoglobin 13.0 - 17.0 g/dL 8.9(L) 8.4(L) 9.4(L)  Hematocrit 39.0 - 52.0 % 25.9(L) 24.7(L) 27.7(L)  Platelets 150 - 400 K/uL PENDING - 207    BMET    Component Value Date/Time   NA 141 10/16/2017 0540   K 4.5 10/16/2017 0540   CL 107 10/16/2017 0540   CO2 28 10/16/2017 0540   GLUCOSE 111 (H) 10/16/2017 0540   BUN 30 (H) 10/16/2017 0540   CREATININE 1.77 (H) 10/16/2017 0540   CALCIUM 8.3 (L) 10/16/2017 0540   GFRNONAA 39 (L) 10/16/2017 0540   GFRAA 45 (L) 10/16/2017 0540     Adele Barthel, MD, FACS [facility] [place] Office: [phone] Pager: [phone]  10/16/2017, 7:27 AM  Addendum   ABI completed: Bilateral ABIs are within normal limits with abnormal waveforms. The ATA ABI is abnormal.  Right great toe pressure is within normal limits.  Left great toe pressure is abnormal.   PTA appears patent at ankle and low calf by duplex imaging.  Unable to visualize mid and proximal portions seondary to bandages.    RIGHT    LEFT    PRESSURE WAVEFORM  PRESSURE WAVEFORM  BRACHIAL 121 T BRACHIAL 122 T  DP   DP    AT 119 0.98 AT 64 0.52  PT 116 0.95 PT 125 1.02  PER   PER    GREAT TOE 85 NA GREAT TOE 6 NA    RIGHT LEFT  ABI 0.98 1.02    - suspect cyanosis in foot is some CVI as LLE perfusion appears intact.   - Preop physiologic studies had L ABI 0.62, R ABI 0.36   Adele Barthel, MD, [facility] [place] Office: [phone] Pager: [phone]  10/16/2017, 2:03 PM

## 2017-10-16 NOTE — Progress Notes (Signed)
PULMONARY / CRITICAL CARE MEDICINE   Name: Dwayne Cuevas MRN: [id number] DOB: September 25, 1951    ADMISSION DATE:  10/14/2017 CONSULTATION DATE:  12/14/16  REFERRING MD:  Dr. [name]  CHIEF COMPLAINT:  Hypotension  HISTORY OF PRESENT ILLNESS:   History is obtained from the chart and from my discussions with Dr. [name].   Dwayne Cuevas is a 66 y.o. (1951/03/19) male who presents with chief complaint: R leg rest pain.  The patient underwent angiography on 09/04/17 which demonstrated occluded R iliac arterial system, common femoral artery and superficial femoral artery with heavily calcified and stenosis L CIA.  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   L CIA was stented to preserve L iliac arterial flow in the event his cardiac risk stratification was high risk.  The patient has been cleared for open surgery at this point by his cardiologist.  Patient notes numbness in his right foot is getting worse.  We were asked to see the patient acutely in the post operative settting for the development of hypotension intra operatively, requiring vasopressor support. Currently he is in the Cardiac ICU, intubated, sedated and on vasopressor support.  SUBJECTIVE:  Tolerated extubation 11/28 well.  No acute events.  Stable.  VITAL SIGNS: BP 104/85   Pulse 97   Temp 99.7 F (37.6 C) (Oral)   Resp (!) 21   Ht 6' (1.829 m)   Wt 83.5 kg (184 lb)   SpO2 97%   BMI 24.95 kg/m   HEMODYNAMICS:    VENTILATOR SETTINGS: Vent Mode: CPAP;PSV FiO2 (%):  [40 %] 40 % PEEP:  [5 cmH20] 5 cmH20 Pressure Support:  [10 cmH20] 10 cmH20  INTAKE / OUTPUT: I/O last 3 completed shifts: In: 5185.1 [I.V.:3426.6; Blood:1178.5; NG/GT:270; IV Piggyback:310] Out: 2840 [Urine:1815; Emesis/NG output:1025]  PHYSICAL EXAMINATION: General:  Adult male, in NAD. Neuro:  A&O x 3, no deficits. HEENT:  [place] / AT.  MMM. Cardiovascular:  RRR, no M/R/G. Lungs:  CTAB. Abdomen:  Midline bandaged surgical incision. Flat, tender to  palpation. Musculoskeletal:  Bilateral femoral, calve surgical incisions. No bleeding from the sites. Skin:  Warm, dry.  LABS:  BMET Recent Labs  Lab 10/14/17 1753 10/15/17 0434 10/16/17 0540  NA 136 138 141  K 5.8* 5.2* 4.5  CL 111 111 107  CO2 19* 19* 28  BUN 23* 27* 30*  CREATININE 1.79* 2.10* 1.77*  GLUCOSE 201* 139* 111*    Electrolytes Recent Labs  Lab 10/14/17 1753 10/15/17 0434 10/16/17 0540  CALCIUM 9.7 9.1 8.3*  MG 1.0* 1.5* 1.6*  PHOS  --   --  3.4    CBC Recent Labs  Lab 10/14/17 1753 10/15/17 0434 10/15/17 1100 10/16/17 0540  WBC 16.4* 17.4*  --  10.0  HGB 9.9* 9.4* 8.4* 8.9*  HCT 29.0* 27.7* 24.7* 25.9*  PLT 173 207  --  119*    Coag's Recent Labs  Lab 10/14/17 1753  APTT 74*  INR 1.37    Sepsis Markers Recent Labs  Lab 10/14/17 1816  LATICACIDVEN 2.6*    ABG Recent Labs  Lab 10/14/17 1805 10/15/17 1025 10/15/17 1132  PHART 7.308* 7.463* 7.389  PCO2ART 46.2 16.3* 36.9  PO2ART 526.0* 77.0* 119.0*    Liver Enzymes Recent Labs  Lab 10/15/17 0434  AST 23  ALT 9*  ALKPHOS 34*  BILITOT 1.1  ALBUMIN 3.1*    Cardiac Enzymes Recent Labs  Lab 10/14/17 1753 10/15/17 0108 10/15/17 0434  TROPONINI <0.03 0.05* 0.05*    Glucose  Recent Labs  Lab 10/14/17 2330 10/15/17 0438 10/15/17 1616 10/15/17 2028 10/15/17 2342 10/16/17 0413  GLUCAP 201* 136* 96 116* 112* 114*    Imaging No results found.   STUDIES:  KUB 11/27 > non specific gas pattern, midline surgical staples, and a tube in the distal portion of the stomach. CXR 11/27 > tt tip approximately at the arch of the aorta, and in the trachea. Right IJ catheter in proper position. No pulmonary edema/opacities. No pneumothorax. Cardiac silhouette appears unremarkable.  Echo 11/27 > EF 55-60%, G1DD.   CULTURES: None     ANTIBIOTICS: none  SIGNIFICANT EVENTS: 11/27 > Post operative hypotension.  LINES/TUBES: ETT 11/27 Right IJ 11/27 Left arterial  line, radial 11/27  DISCUSSION: 66 y.o. Male with postoperative hypotension requiring vasopressor support.  ASSESSMENT / PLAN:  PULMONARY A:  Acute postoperative respiratory failure - on vent, s/p extubation 11/28. P:   Bronchial hygiene. No further interventions required.  CARDIOVASCULAR A:  Hypotension - required pressors but off AM 11/28.  Echo with EF 55-60%, mild G1DD.  Had preoperative cardiac clearance. H/O coronary calcifications, normal nuclear stress test 2017. Cleared preoperatively for surgery. Sinus brady - felt to be due to precedex.  Resolved off precedex. P:  Hold all antihypertensives (d/c'd scheduled enalapril, HCTZ). Cardiology following.  RENAL A:  Mild hyperkalemia - resolved. AKI. NAGMA - resolved. Hypomagnesemia. P:   D/c HCO3. 2g Mag now. Correct electrolytes as indicated. Follow BMP.  GASTROINTESTINAL A:   Nutrition. P:   Defer diet to vascular.  HEMATOLOGIC A:   Acute postoperative blood loss. Thrombocytopenia. P:  Monitor H/H, transfuse if Hgb < 8gms giving his risk for myocardial ischemia.  INFECTIOUS A:   No indication of infection. P:   Monitor clinically.  ENDOCRINE A:  No acute issues.   P:  No interventions required.  NEUROLOGIC A:   Sedation for mechanical ventilation - resolved. Post op pain. P:   Continue PCA per vascular.   FAMILY  - Updates: family not available at this time.  - Inter-disciplinary family meet or Palliative Care meeting due by:  day 7   PCCM will sign off.  Please do not hesitate to call us back if we can be of any further assistance.    Montey Hora, [place] [facility] Pager: [phone]  or [phone] 10/16/2017, 8:34 AM  Attending Note:  66 year old s/p vascular procedure who was left intubated and was extubated yesterday.  On exam, lungs are clear and mental status is normal.  I reviewed CXR myself, no acute disease noted.  Discussed  with PCCM-NP  Acute respiratory failure:  - Monitor for airway protection with PCA in place  Hypoxemia:  - Titrate O2 for sat of 88-92%  Leg pain  - PCA  Metabolic acidosis  - D/C bicarb  PCCM will sign off, please call back if needed.  Patient seen and examined, agree with above note.  I dictated the care and orders written for this patient under my direction.  Rush Farmer, [place]

## 2017-10-16 NOTE — Progress Notes (Signed)
   Daily Progress Note   - Pt is baseline on HCTZ.  Will given Lasix 20 mg IV q12 hours in replacement. Morning BMP ordered  CBC Latest Ref Rng & Units 10/16/2017 10/16/2017 10/15/2017  WBC 4.0 - 10.5 K/uL - 10.0 -  Hemoglobin 13.0 - 17.0 g/dL 8.7(L) 8.9(L) 8.4(L)  Hematocrit 39.0 - 52.0 % 25.7(L) 25.9(L) 24.7(L)  Platelets 150 - 400 K/uL - 119(L) -   - H/H stable at this point. HD stable.  No need for transfusion.  Vit C/Iron once tolerating PO   Adele Barthel, MD, FACS [facility] [place] Office: [phone] Pager: [phone]  10/16/2017, 7:49 PM

## 2017-10-16 NOTE — Progress Notes (Signed)
Hypoglycemic Event  CBG: 64  Treatment: 1/2 amp d50 at 1950   Symptoms: none   Follow-up CBG: Time: 2005 CBG Result:105  Possible Reasons for Event: NPO  Comments/MD notified:     Bing Quarry

## 2017-10-16 NOTE — Anesthesia Postprocedure Evaluation (Signed)
Anesthesia Post Note  Patient: Dwayne Cuevas  Procedure(s) Performed: AORTA BIFEMORAL BYPASS GRAFT (Bilateral ) Right FEMORAL-POPLITEAL ARTERY Bypass graft using gortex graft (Right Leg Upper) INTRA OPERATIVE ARTERIOGRAM (Left Leg Lower) THROMBECTOMY Left Lower leg (Left Leg Lower)     Patient location during evaluation: SICU Anesthesia Type: General Level of consciousness: sedated Pain management: pain level controlled Vital Signs Assessment: post-procedure vital signs reviewed and stable Respiratory status: patient remains intubated per anesthesia plan (remained intubated to [facility]) Cardiovascular status: unstable (Surgery terminated due to extreme Hypotension , remained on pressors to transport ot DICU) Postop Assessment: no apparent nausea or vomiting (To SICU on pressor support and intubated) Anesthetic complications: no Comments: Will follow cousre next few days    Last Vitals:  Vitals:   10/16/17 1000 10/16/17 1100  BP: (!) 121/59 (!) 137/54  Pulse: 85 84  Resp: (!) 22 (!) 25  Temp:    SpO2: 97% 95%    Last Pain:  Vitals:   10/16/17 1100  TempSrc:   PainSc: 2                  Halen Mossbarger,JAMES TERRILL

## 2017-10-16 NOTE — Progress Notes (Signed)
VASCULAR LAB PRELIMINARY  ARTERIAL  ABI completed: Bilateral ABIs are within normal limits with abnormal waveforms. The ATA ABI is abnormal.  Right great toe pressure is within normal limits.  Left great toe pressure is abnormal.   PTA appears patent at ankle and low calf by duplex imaging.  Unable to visualize mid and proximal portions seondary to bandages.    RIGHT    LEFT    PRESSURE WAVEFORM  PRESSURE WAVEFORM  BRACHIAL 121 T BRACHIAL 122 T  DP   DP    AT 119 0.98 AT 64 0.52  PT 116 0.95 PT 125 1.02  PER   PER    GREAT TOE 85 NA GREAT TOE 6 NA    RIGHT LEFT  ABI 0.98 1.02     Yen Wandell, RVT 10/16/2017, 12:52 PM

## 2017-10-17 LAB — GLUCOSE, CAPILLARY
Glucose-Capillary: 113 mg/dL — ABNORMAL HIGH (ref 65–99)
Glucose-Capillary: 105 mg/dL — ABNORMAL HIGH (ref 65–99)
Glucose-Capillary: 99 mg/dL (ref 65–99)
Glucose-Capillary: 95 mg/dL (ref 65–99)
Glucose-Capillary: 92 mg/dL (ref 65–99)

## 2017-10-17 LAB — CBC
HCT: 25.4 % — ABNORMAL LOW (ref 39.0–52.0)
Hemoglobin: 8.4 g/dL — ABNORMAL LOW (ref 13.0–17.0)
MCH: 30 pg (ref 26.0–34.0)
MCHC: 33.1 g/dL (ref 30.0–36.0)
MCV: 90.7 fL (ref 78.0–100.0)
Platelets: 135 10*3/uL — ABNORMAL LOW (ref 150–400)
RBC: 2.8 MIL/uL — ABNORMAL LOW (ref 4.22–5.81)
RDW: 14.2 % (ref 11.5–15.5)
WBC: 10.2 10*3/uL (ref 4.0–10.5)

## 2017-10-17 LAB — BASIC METABOLIC PANEL
Anion gap: 5 (ref 5–15)
BUN: 22 mg/dL — ABNORMAL HIGH (ref 6–20)
CO2: 30 mmol/L (ref 22–32)
Calcium: 8.1 mg/dL — ABNORMAL LOW (ref 8.9–10.3)
Chloride: 101 mmol/L (ref 101–111)
Creatinine, Ser: 1.62 mg/dL — ABNORMAL HIGH (ref 0.61–1.24)
GFR calc Af Amer: 50 mL/min — ABNORMAL LOW (ref >60)
GFR calc non Af Amer: 43 mL/min — ABNORMAL LOW (ref >60)
Glucose, Bld: 110 mg/dL — ABNORMAL HIGH (ref 65–99)
Potassium: 3.5 mmol/L (ref 3.5–5.1)
Sodium: 136 mmol/L (ref 135–145)

## 2017-10-17 MED ORDER — PNEUMOCOCCAL VAC POLYVALENT 25 MCG/0.5ML IJ INJ
0.5000 mL | INJECTION | INTRAMUSCULAR | Status: DC | PRN
Start: 2017-10-17 — End: 2017-10-22

## 2017-10-17 NOTE — Progress Notes (Signed)
  Progress Note    10/17/2017 8:38 AM 3 Days Post-Op  Subjective:  Stable right foot pain, no pain in left  Vitals:   10/17/17 0745 10/17/17 0750  BP:    Pulse:    Resp:  18  Temp: 99.8 F (37.7 C)   SpO2:  92%    Physical Exam: aaox3 Non labored respirations Abdomen is soft, incisions cdi Palpable right dp Strong left pt signal Left great toe is cool, other toes are warm and well perfused  CBC    Component Value Date/Time   WBC 10.2 10/17/2017 0446   RBC 2.80 (L) 10/17/2017 0446   HGB 8.4 (L) 10/17/2017 0446   HCT 25.4 (L) 10/17/2017 0446   PLT 135 (L) 10/17/2017 0446   MCV 90.7 10/17/2017 0446   MCH 30.0 10/17/2017 0446   MCHC 33.1 10/17/2017 0446   RDW 14.2 10/17/2017 0446   LYMPHSABS 3.1 08/05/2017 2301   MONOABS 0.6 08/05/2017 2301   EOSABS 0.1 08/05/2017 2301   BASOSABS 0.0 08/05/2017 2301    BMET    Component Value Date/Time   NA 136 10/17/2017 0446   K 3.5 10/17/2017 0446   CL 101 10/17/2017 0446   CO2 30 10/17/2017 0446   GLUCOSE 110 (H) 10/17/2017 0446   BUN 22 (H) 10/17/2017 0446   CREATININE 1.62 (H) 10/17/2017 0446   CALCIUM 8.1 (L) 10/17/2017 0446   GFRNONAA 43 (L) 10/17/2017 0446   GFRAA 50 (L) 10/17/2017 0446    INR    Component Value Date/Time   INR 1.37 10/14/2017 1753     Intake/Output Summary (Last 24 hours) at 10/17/2017 0838 Last data filed at 10/17/2017 0700 Gross per 24 hour  Intake 1520.42 ml  Output 4525 ml  Net -3004.58 ml    A/P: 66yo WM POD #3 s/p ABF, R CFA to BK pop BPG complicated by intraop hypotension of unknown etiology leading to ARF (resolving)   Neuro: continue pca pain control  Pulm: ISS, oob  CV: stable  GI: clamp ng tube, possibly remove today  KCM:[id number] ivf to 50  HEME/ID: stable, no abx  REN: on bid lasix with improving Cr, will change to hctz when taking po  PPx: subq heparin     Brandon C. Donzetta Matters, MD [facility] [place] Office: [phone] Pager:  [phone]  10/17/2017 8:38 AM

## 2017-10-17 NOTE — Care Management Note (Addendum)
Case Management Note  Patient Details  Name: Dwayne Cuevas MRN: [id number] Date of Birth: [birth year]  Subjective/Objective:   Pt is s/pABF, R CFA to BK pop BPG complicated by intraop hypotension of unknown etiology leading to ARF (resolving)  Action/Plan:  PTA independent from home.  Pt has PCP and denied barriers to obtaining medications.  Pt will transport home via private vehicle driven by pts pastor.  CM requested HHPT order via bedside nurse.  Pt was referred to [facility] and agency is following for potential discharge needs.  CM will continue to follow for discharge needs   Expected Discharge Date:                  Expected Discharge Plan:  [place]  In-House Referral:     Discharge planning Services  CM Consult  Post Acute Care Choice:    Choice offered to:  Patient  DME Arranged:    DME Agency:     HH Arranged:    [place] Agency:  [facility]  Status of Service:  In process, will continue to follow  If discussed at Long Length of Stay Meetings, dates discussed:    Additional Comments:  Dwayne Labrador, RN 10/17/2017, 3:01 PM

## 2017-10-17 NOTE — Progress Notes (Addendum)
Physical Therapy Treatment Patient Details Name: Dwayne Cuevas MRN: [id number] DOB: 05/31/1951 Today's Date: 10/17/2017    History of Present Illness Pt is a 66 y.o. male admitted on 10/14/17 with an abdominal aortic aneurysm and aortoiliac occlusive disease. Pt now s/p aortobifemoral bypass, R femoral below knee popliteal bypass, and L femoral embolectomy on 10/14/17. Pertinent PMH includes scoliosis, PE, HTN, gout, DVT, CAD, COPD.   PT Comments    Pt with improved ambulation distance this session, reliant on Eva walker and intermittent min guard for balance; remains limited by fatigue and pain. Educated on importance of continued mobility with therapies and nursing staff. Based on pt's current status, d/c recs updated to include HHPT to maximize pt's functional mobility and independence.   Follow Up Recommendations  Home health PT;Supervision - Intermittent     Equipment Recommendations  Other (comment)(TBD)    Recommendations for Other Services       Precautions / Restrictions Precautions Precautions: Fall Restrictions Weight Bearing Restrictions: No    Mobility  Bed Mobility Overal bed mobility: Needs Assistance Bed Mobility: Sit to Supine       Sit to supine: Min guard;HOB elevated      Transfers Overall transfer level: Needs assistance Equipment used: 4-wheeled walker(Eva walker) Transfers: Sit to/from Stand Sit to Stand: Min guard         General transfer comment: Increased time and effort secondary to increased pain and nausea; no physical assist required. Heavy reliance on BUEs to push into standing  Ambulation/Gait Ambulation/Gait assistance: Min guard Ambulation Distance (Feet): 60 Feet Assistive device: 4-wheeled walker(Eva walker) Gait Pattern/deviations: Step-through pattern;Decreased stride length;Trunk flexed;Antalgic Gait velocity: Decreased Gait velocity interpretation: <1.8 ft/sec, indicative of risk for recurrent falls General Gait  Details: Very slow, antalgic gait with Eva walker and min guard for balance. Intermittent standing rest breaks secondary to fatigue and LLE pain. Cues for upright posture as pt forward flexed/guarded with abdominal incision   Stairs            Wheelchair Mobility    Modified Rankin (Stroke Patients Only)       Balance Overall balance assessment: Needs assistance Sitting-balance support: No upper extremity supported;Feet supported Sitting balance-Leahy Scale: Good       Standing balance-Leahy Scale: Poor Standing balance comment: Reliant on BUE support                            Cognition Arousal/Alertness: Awake/alert Behavior During Therapy: Flat affect Overall Cognitive Status: Within Functional Limits for tasks assessed                                        Exercises      General Comments General comments (skin integrity, edema, etc.): VSS; on 4L O2 [place]      Pertinent Vitals/Pain Pain Assessment: Faces Faces Pain Scale: Hurts even more Pain Location: LLE Pain Descriptors / Indicators: Discomfort;Grimacing Pain Intervention(s): Limited activity within patient's tolerance;Monitored during session;Repositioned    Home Living                      Prior Function            PT Goals (current goals can now be found in the care plan section) Acute Rehab PT Goals Patient Stated Goal: less pain PT Goal Formulation: With patient Time For Goal  Achievement: 10/29/17 Potential to Achieve Goals: Good Progress towards PT goals: Progressing toward goals    Frequency    Min 3X/week      PT Plan Discharge plan needs to be updated    Co-evaluation              AM-PAC PT "6 Clicks" Daily Activity  Outcome Measure  Difficulty turning over in bed (including adjusting bedclothes, sheets and blankets)?: A Little Difficulty moving from lying on back to sitting on the side of the bed? : A Little Difficulty sitting down  on and standing up from a chair with arms (e.g., wheelchair, bedside commode, etc,.)?: A Little Help needed moving to and from a bed to chair (including a wheelchair)?: A Little Help needed walking in hospital room?: A Little Help needed climbing 3-5 steps with a railing? : A Lot 6 Click Score: 17    End of Session Equipment Utilized During Treatment: Gait belt;Oxygen Activity Tolerance: Patient tolerated treatment well;Patient limited by fatigue Patient left: in bed;with call bell/phone within reach Nurse Communication: Mobility status PT Visit Diagnosis: Other abnormalities of gait and mobility (R26.89);Pain Pain - part of body: Leg     Time: 1694-5038 PT Time Calculation (min) (ACUTE ONLY): 38 min  Charges:  $Gait Training: 23-37 mins $Therapeutic Activity: 8-22 mins                    G Codes:      Mabeline Caras, PT, DPT Acute Rehab Services  Pager: [place] 10/17/2017, 4:22 PM

## 2017-10-18 LAB — GLUCOSE, CAPILLARY
Glucose-Capillary: 100 mg/dL — ABNORMAL HIGH (ref 65–99)
Glucose-Capillary: 108 mg/dL — ABNORMAL HIGH (ref 65–99)
Glucose-Capillary: 118 mg/dL — ABNORMAL HIGH (ref 65–99)
Glucose-Capillary: 91 mg/dL (ref 65–99)
Glucose-Capillary: 93 mg/dL (ref 65–99)
Glucose-Capillary: 97 mg/dL (ref 65–99)

## 2017-10-18 LAB — BASIC METABOLIC PANEL
Anion gap: 10 (ref 5–15)
BUN: 18 mg/dL (ref 6–20)
CO2: 30 mmol/L (ref 22–32)
Calcium: 8 mg/dL — ABNORMAL LOW (ref 8.9–10.3)
Chloride: 96 mmol/L — ABNORMAL LOW (ref 101–111)
Creatinine, Ser: 1.52 mg/dL — ABNORMAL HIGH (ref 0.61–1.24)
GFR calc Af Amer: 54 mL/min — ABNORMAL LOW (ref >60)
GFR calc non Af Amer: 46 mL/min — ABNORMAL LOW (ref >60)
Glucose, Bld: 100 mg/dL — ABNORMAL HIGH (ref 65–99)
Potassium: 3.3 mmol/L — ABNORMAL LOW (ref 3.5–5.1)
Sodium: 136 mmol/L (ref 135–145)

## 2017-10-18 LAB — CBC
HCT: 25.1 % — ABNORMAL LOW (ref 39.0–52.0)
Hemoglobin: 8.3 g/dL — ABNORMAL LOW (ref 13.0–17.0)
MCH: 30 pg (ref 26.0–34.0)
MCHC: 33.1 g/dL (ref 30.0–36.0)
MCV: 90.6 fL (ref 78.0–100.0)
Platelets: 158 K/uL (ref 150–400)
RBC: 2.77 MIL/uL — ABNORMAL LOW (ref 4.22–5.81)
RDW: 13.4 % (ref 11.5–15.5)
WBC: 9 K/uL (ref 4.0–10.5)

## 2017-10-18 MED ORDER — DOCUSATE SODIUM 100 MG PO CAPS
100.0000 mg | ORAL_CAPSULE | Freq: Every day | ORAL | Status: DC
  Administered 2017-10-18 – 2017-10-22 (×5): 100 mg via ORAL
  Filled 2017-10-18 (×5): qty 1

## 2017-10-18 MED ORDER — PANTOPRAZOLE SODIUM 40 MG PO TBEC
40.0000 mg | DELAYED_RELEASE_TABLET | Freq: Every day | ORAL | Status: DC
Start: 2017-10-18 — End: 2017-10-22
  Administered 2017-10-18 – 2017-10-22 (×5): 40 mg via ORAL
  Filled 2017-10-18 (×5): qty 1

## 2017-10-18 NOTE — Progress Notes (Signed)
  Progress Note    10/18/2017 9:16 AM 4 Days Post-Op  Subjective:  Numb in right foot and left thigh  Vitals:   10/18/17 0600 10/18/17 0800  BP: (!) 104/53   Pulse: 75   Resp: 14 12  Temp:    SpO2: 96% 95%    Physical Exam: aaox3 Non labored respirations Abdomen is soft, incisions cdi Palpable right dp Strong left pt signal, left great toe is better perfused this a.m.    CBC    Component Value Date/Time   WBC 9.0 10/18/2017 0752   RBC 2.77 (L) 10/18/2017 0752   HGB 8.3 (L) 10/18/2017 0752   HCT 25.1 (L) 10/18/2017 0752   PLT 158 10/18/2017 0752   MCV 90.6 10/18/2017 0752   MCH 30.0 10/18/2017 0752   MCHC 33.1 10/18/2017 0752   RDW 13.4 10/18/2017 0752   LYMPHSABS 3.1 08/05/2017 2301   MONOABS 0.6 08/05/2017 2301   EOSABS 0.1 08/05/2017 2301   BASOSABS 0.0 08/05/2017 2301    BMET    Component Value Date/Time   NA 136 10/17/2017 0446   K 3.5 10/17/2017 0446   CL 101 10/17/2017 0446   CO2 30 10/17/2017 0446   GLUCOSE 110 (H) 10/17/2017 0446   BUN 22 (H) 10/17/2017 0446   CREATININE 1.62 (H) 10/17/2017 0446   CALCIUM 8.1 (L) 10/17/2017 0446   GFRNONAA 43 (L) 10/17/2017 0446   GFRAA 50 (L) 10/17/2017 0446    INR    Component Value Date/Time   INR 1.37 10/14/2017 1753     Intake/Output Summary (Last 24 hours) at 10/18/2017 0916 Last data filed at 10/18/2017 0600 Gross per 24 hour  Intake 1290 ml  Output 1905 ml  Net -615 ml     A/P: 66yo WM POD #4s/p ABF, R CFA to BK pop BPG complicated by intraop hypotension of unknown etiology leading to ARF (resolving)   Neuro: continue pca pain control  Pulm: ISS, oob  CV: stable  GI: clear liquids  FEN:ivf at 50, f/u labs and possibly dc foley  HEME/ID: stable, no abx  REN: on bid lasix with improving Cr, will change to hctz when taking po  PPx: subq heparin  Dispo: transfer to telemetry when bed available     Brandon C. Donzetta Matters, MD [facility] Office:  [phone] Pager: [phone]  10/18/2017 9:16 AM

## 2017-10-19 ENCOUNTER — Inpatient Hospital Stay (HOSPITAL_COMMUNITY): Payer: Medicare Other

## 2017-10-19 LAB — GLUCOSE, CAPILLARY
Glucose-Capillary: 103 mg/dL — ABNORMAL HIGH (ref 65–99)
Glucose-Capillary: 107 mg/dL — ABNORMAL HIGH (ref 65–99)
Glucose-Capillary: 110 mg/dL — ABNORMAL HIGH (ref 65–99)
Glucose-Capillary: 115 mg/dL — ABNORMAL HIGH (ref 65–99)
Glucose-Capillary: 116 mg/dL — ABNORMAL HIGH (ref 65–99)
Glucose-Capillary: 122 mg/dL — ABNORMAL HIGH (ref 65–99)
Glucose-Capillary: 98 mg/dL (ref 65–99)

## 2017-10-19 LAB — BASIC METABOLIC PANEL
Anion gap: 7 (ref 5–15)
BUN: 16 mg/dL (ref 6–20)
CO2: 32 mmol/L (ref 22–32)
Calcium: 8 mg/dL — ABNORMAL LOW (ref 8.9–10.3)
Chloride: 97 mmol/L — ABNORMAL LOW (ref 101–111)
Creatinine, Ser: 1.51 mg/dL — ABNORMAL HIGH (ref 0.61–1.24)
GFR calc Af Amer: 54 mL/min — ABNORMAL LOW (ref >60)
GFR calc non Af Amer: 47 mL/min — ABNORMAL LOW (ref >60)
Glucose, Bld: 107 mg/dL — ABNORMAL HIGH (ref 65–99)
Potassium: 3.4 mmol/L — ABNORMAL LOW (ref 3.5–5.1)
Sodium: 136 mmol/L (ref 135–145)

## 2017-10-19 LAB — CBC
HCT: 23.4 % — ABNORMAL LOW (ref 39.0–52.0)
Hemoglobin: 8 g/dL — ABNORMAL LOW (ref 13.0–17.0)
MCH: 30.7 pg (ref 26.0–34.0)
MCHC: 34.2 g/dL (ref 30.0–36.0)
MCV: 89.7 fL (ref 78.0–100.0)
Platelets: 178 10*3/uL (ref 150–400)
RBC: 2.61 MIL/uL — ABNORMAL LOW (ref 4.22–5.81)
RDW: 13.2 % (ref 11.5–15.5)
WBC: 9 10*3/uL (ref 4.0–10.5)

## 2017-10-19 MED ORDER — HYDROCHLOROTHIAZIDE 25 MG PO TABS
25.0000 mg | ORAL_TABLET | Freq: Every day | ORAL | Status: DC
  Administered 2017-10-19 – 2017-10-22 (×4): 25 mg via ORAL
  Filled 2017-10-19 (×4): qty 1

## 2017-10-19 NOTE — Progress Notes (Signed)
Pt received from [place]. Pt oriented to room and equipment. Dinner tray re routed. Telemetry applied, CCMD notified. VSS. CBG 116. Call bell within reach, pt's pastor at bedside, will continue to monitor.   Fritz Pickerel, RN

## 2017-10-19 NOTE — Progress Notes (Signed)
Morphine PCA discontinued.  45ml of morphine wasted in the sink witnessed by RN Warden Fillers.  Bobette Mo

## 2017-10-19 NOTE — Plan of Care (Signed)
Overall the patient does appear to be progressing well; however, I have noticed that he does have some fine crackles in all lobes on posterior ascultation. 20mg  Lasix was given this morning and the patient does have a strong cough to clear any secretions.  He does indicate that he feels congested.  O2 sats are in the high 90s.  Any desat events are due to pt positioning and sensor placement.  Will continue to monitor and annotate any changes.

## 2017-10-19 NOTE — Progress Notes (Signed)
  Progress Note    10/19/2017 9:47 AM 5 Days Post-Op  Subjective:  Tolerating clears, having flatus  Vitals:   10/19/17 0800 10/19/17 0901  BP: 123/61   Pulse: 87   Resp: 20 12  Temp: 98.9 F (37.2 C)   SpO2: 95% 96%    Physical Exam: aaox3 Non labored respirations Abdomen is soft, incisions cdi Palpable right dp, Strong left pt signa   CBC    Component Value Date/Time   WBC 9.0 10/19/2017 0450   RBC 2.61 (L) 10/19/2017 0450   HGB 8.0 (L) 10/19/2017 0450   HCT 23.4 (L) 10/19/2017 0450   PLT 178 10/19/2017 0450   MCV 89.7 10/19/2017 0450   MCH 30.7 10/19/2017 0450   MCHC 34.2 10/19/2017 0450   RDW 13.2 10/19/2017 0450   LYMPHSABS 3.1 08/05/2017 2301   MONOABS 0.6 08/05/2017 2301   EOSABS 0.1 08/05/2017 2301   BASOSABS 0.0 08/05/2017 2301    BMET    Component Value Date/Time   NA 136 10/19/2017 0450   K 3.4 (L) 10/19/2017 0450   CL 97 (L) 10/19/2017 0450   CO2 32 10/19/2017 0450   GLUCOSE 107 (H) 10/19/2017 0450   BUN 16 10/19/2017 0450   CREATININE 1.51 (H) 10/19/2017 0450   CALCIUM 8.0 (L) 10/19/2017 0450   GFRNONAA 47 (L) 10/19/2017 0450   GFRAA 54 (L) 10/19/2017 0450    INR    Component Value Date/Time   INR 1.37 10/14/2017 1753     Intake/Output Summary (Last 24 hours) at 10/19/2017 0947 Last data filed at 10/19/2017 0700 Gross per 24 hour  Intake 1700 ml  Output 2650 ml  Net -950 ml    A/P: 66yo WMPOD #5s/p ABF, R CFA to BK pop BPG complicated by intraop hypotension of unknown etiology leading to ARF (resolving)   Neuro:continue pca pain control  Pulm: ISS, oob- will check cxr  CV: stable  XB:JYNWGNF as tolerated  AOZ:HYQM  HEME/ID:stable, no abx  VHQ:IONGE dose this a.m, restart home hctz  PPx: subq heparin  Dispo: transfer to telemetry when bed available     Julis Haubner C. Donzetta Matters, MD [facility] [place] Office: [phone] Pager: [phone]  10/19/2017 9:47 AM

## 2017-10-20 LAB — GLUCOSE, CAPILLARY
Glucose-Capillary: 111 mg/dL — ABNORMAL HIGH (ref 65–99)
Glucose-Capillary: 104 mg/dL — ABNORMAL HIGH (ref 65–99)
Glucose-Capillary: 88 mg/dL (ref 65–99)
Glucose-Capillary: 87 mg/dL (ref 65–99)
Glucose-Capillary: 138 mg/dL — ABNORMAL HIGH (ref 65–99)
Glucose-Capillary: 83 mg/dL (ref 65–99)

## 2017-10-20 MED ORDER — VITAMIN C 500 MG PO TABS
1000.0000 mg | ORAL_TABLET | Freq: Every day | ORAL | Status: DC
  Administered 2017-10-20 – 2017-10-22 (×3): 1000 mg via ORAL
  Filled 2017-10-20 (×3): qty 2

## 2017-10-20 MED ORDER — BISACODYL 10 MG RE SUPP: 10.0000 mg | Freq: Every day | RECTAL | Status: DC | PRN

## 2017-10-20 MED ORDER — FERROUS SULFATE 325 (65 FE) MG PO TABS
325.0000 mg | ORAL_TABLET | Freq: Three times a day (TID) | ORAL | Status: DC
  Administered 2017-10-20 – 2017-10-22 (×5): 325 mg via ORAL
  Filled 2017-10-20 (×6): qty 1

## 2017-10-20 MED ORDER — POLYETHYLENE GLYCOL 3350 17 G PO PACK
17.0000 g | PACK | Freq: Every day | ORAL | Status: DC | PRN
  Administered 2017-10-20: 17 g via ORAL
  Filled 2017-10-20: qty 1

## 2017-10-20 MED ORDER — FLEET ENEMA 7-19 GM/118ML RE ENEM: 1.0000 | ENEMA | Freq: Every day | RECTAL | Status: DC | PRN

## 2017-10-20 NOTE — Progress Notes (Signed)
Occupational Therapy Treatment Patient Details Name: Dwayne Cuevas MRN: [id number] DOB: [birth year] Today's Date: 10/20/2017    History of present illness Pt is a 66 y.o. male admitted on 10/14/17 with an abdominal aortic aneurysm and aortoiliac occlusive disease. Pt now s/p aortobifemoral bypass, R femoral below knee popliteal bypass, and L femoral embolectomy on 10/14/17. Pertinent PMH includes scoliosis, PE, HTN, gout, DVT, CAD, COPD.   OT comments  Pt demonstrating progress toward OT goals. He reports increased nausea today as well as blurred vision to the point of being unable to read signs on wall. Both had resolved on OT arrival. Pt was able to complete toilet transfers and functional mobility in hallway with min guard assist initially (approximately 150 feet). Pt demonstrating fatigue at end of session and requiring up to min assist on return to room. He is demonstrating improving ability to complete LB ADL with min assist this session. D/C recommendation remains appropriate and OT will continue to follow while admitted.    Follow Up Recommendations  No OT follow up;Supervision/Assistance - 24 hour    Equipment Recommendations  3 in 1 bedside commode    Recommendations for Other Services      Precautions / Restrictions Precautions Precautions: Fall Restrictions Weight Bearing Restrictions: No       Mobility Bed Mobility Overal bed mobility: Needs Assistance Bed Mobility: Sit to Supine Rolling: Min guard Sidelying to sit: Min guard       General bed mobility comments: Min guard assist for safety and VC's for safe technique.   Transfers Overall transfer level: Needs assistance Equipment used: Rolling walker (2 wheeled) Transfers: Sit to/from Stand Sit to Stand: Min guard         General transfer comment: Close guarding assist.     Balance Overall balance assessment: Needs assistance Sitting-balance support: No upper extremity supported;Feet supported Sitting  balance-Leahy Scale: Good     Standing balance support: Bilateral upper extremity supported;No upper extremity supported Standing balance-Leahy Scale: Poor Standing balance comment: Reliant on BUE support                           ADL either performed or assessed with clinical judgement   ADL Overall ADL's : Needs assistance/impaired Eating/Feeding: Set up;Sitting   Grooming: Min guard;Standing   Upper Body Bathing: Set up;Sitting   Lower Body Bathing: Minimal assistance;Sit to/from stand   Upper Body Dressing : Set up;Sitting   Lower Body Dressing: Minimal assistance;Sit to/from stand   Toilet Transfer: Min guard;Ambulation;RW   Toileting- Clothing Manipulation and Hygiene: Moderate assistance;Sit to/from stand       Functional mobility during ADLs: Min guard;Minimal assistance;Rolling walker General ADL Comments: Pt fatiguing at end of session requiring min assist for functional mobility. Able to complete LB dressing tasks with min assist.      Vision   Vision Assessment?: Yes Eye Alignment: Within Functional Limits Ocular Range of Motion: Within Functional Limits Alignment/Gaze Preference: Within Defined Limits Tracking/Visual Pursuits: Able to track stimulus in all quads without difficulty Additional Comments: Need to assess visual fields. Reports episode of blurry vision and unable to read this morning. Able to read now and reports that he feels this is likely related to blood sugar issues.    Perception     Praxis      Cognition Arousal/Alertness: Awake/alert Behavior During Therapy: WFL for tasks assessed/performed Overall Cognitive Status: Within Functional Limits for tasks assessed  Exercises     Shoulder Instructions       General Comments Initially able to maintain O2 saturations >90% on RA with encouragement for breathing techniques. However, poor ability to obtain SpO2 reading  during mobility and pt reporting feeling short of breath so replaced 2L O2.    Pertinent Vitals/ Pain       Pain Assessment: 0-10 Pain Score: 6  Pain Location: Abdomen, B LE Pain Descriptors / Indicators: Discomfort;Grimacing Pain Intervention(s): Limited activity within patient's tolerance;Monitored during session;Repositioned  Home Living                                          Prior Functioning/Environment              Frequency  Min 2X/week        Progress Toward Goals  OT Goals(current goals can now be found in the care plan section)  Progress towards OT goals: Progressing toward goals  Acute Rehab OT Goals Patient Stated Goal: less pain OT Goal Formulation: With patient Time For Goal Achievement: 10/29/17 Potential to Achieve Goals: Good  Plan Discharge plan remains appropriate    Co-evaluation                 AM-PAC PT "6 Clicks" Daily Activity     Outcome Measure   Help from another person eating meals?: A Little Help from another person taking care of personal grooming?: A Little Help from another person toileting, which includes using toliet, bedpan, or urinal?: A Little Help from another person bathing (including washing, rinsing, drying)?: A Little Help from another person to put on and taking off regular upper body clothing?: A Little Help from another person to put on and taking off regular lower body clothing?: A Little 6 Click Score: 18    End of Session Equipment Utilized During Treatment: Gait belt;Rolling walker  OT Visit Diagnosis: Other abnormalities of gait and mobility (R26.89);Pain Pain - Right/Left: (abdominal) Pain - part of body: Leg(B LE and abdominal)   Activity Tolerance Patient limited by pain   Patient Left with call bell/phone within reach;in chair   Nurse Communication Mobility status        Time: 1352-1420 OT Time Calculation (min): 28 min  Charges: OT General Charges $OT Visit: 1  Visit OT Treatments $Self Care/Home Management : 23-37 mins  Norman Herrlich, MS OTR/L  Pager: Dwayne Cuevas 10/20/2017, 3:24 PM

## 2017-10-20 NOTE — Progress Notes (Signed)
Physical Therapy Treatment Patient Details Name: Dwayne Cuevas MRN: [id number] DOB: [birth year] Today's Date: 10/20/2017    History of Present Illness Pt is a 66 y.o. male admitted on 10/14/17 with an abdominal aortic aneurysm and aortoiliac occlusive disease. Pt now s/p aortobifemoral bypass, R femoral below knee popliteal bypass, and L femoral embolectomy on 10/14/17. Pertinent PMH includes scoliosis, PE, HTN, gout, DVT, CAD, COPD.    PT Comments    Pt min guard for safety during transfers.Pt able to perform proper transfer with no vc needed. During ambulation pt fatigued required 2 standing rest breaks. O2 80-94% on 2L/min [place]. Assisted pt in restroom. Pt able to perform self pericare while standing LUE support on RW  no LOB min guard for safety. Pt will continue to benefit from therapy to help increase their independence and safety with mobility.   Follow Up Recommendations  Home health PT;Supervision - Intermittent     Equipment Recommendations  Other (comment)(TBD)    Recommendations for Other Services       Precautions / Restrictions Precautions Precautions: Fall Restrictions Weight Bearing Restrictions: No    Mobility  Bed Mobility        General bed mobility comments: in recliner upon arrival  Transfers Overall transfer level: Needs assistance Equipment used: Rolling walker (2 wheeled) Transfers: Sit to/from Stand Sit to Stand: Min guard         General transfer comment: min guard for safety  Ambulation/Gait Ambulation/Gait assistance: Min guard Ambulation Distance (Feet): 200 Feet Assistive device: Rolling walker (2 wheeled) Gait Pattern/deviations: Step-through pattern;Decreased stride length;Trunk flexed;Antalgic;Step-to pattern Gait velocity: Decreased   General Gait Details: slow steady antalgic gait. VC for erect posture and increased step length. VC for RW management. SpO2 80-94% on 2L/min [place] during ambulation. Required 2 standing rest breaks due  to increased fatigue and pain in R LE rated 10/10.    Stairs            Wheelchair Mobility    Modified Rankin (Stroke Patients Only)       Balance Overall balance assessment: Needs assistance Sitting-balance support: No upper extremity supported;Feet supported Sitting balance-Leahy Scale: Good     Standing balance support: Bilateral upper extremity supported;No upper extremity supported Standing balance-Leahy Scale: Poor Standing balance comment: Reliant on RW for BUE support                            Cognition Arousal/Alertness: Awake/alert Behavior During Therapy: WFL for tasks assessed/performed Overall Cognitive Status: Within Functional Limits for tasks assessed                                        Exercises      General Comments General comments (skin integrity, edema, etc.): Initially able to maintain O2 saturations >90% on RA with encouragement for breathing techniques. However, poor ability to obtain SpO2 reading during mobility and pt reporting feeling short of breath so replaced 2L O2.      Pertinent Vitals/Pain Pain Assessment: 0-10 Pain Score: 6  Pain Location: Abdomen, R LE Pain Descriptors / Indicators: Discomfort;Grimacing Pain Intervention(s): Monitored during session;Repositioned    Home Living                      Prior Function            PT Goals (current  goals can now be found in the care plan section) Acute Rehab PT Goals Patient Stated Goal: not discussed Progress towards PT goals: Progressing toward goals    Frequency    Min 3X/week      PT Plan Current plan remains appropriate    Co-evaluation              AM-PAC PT "6 Clicks" Daily Activity  Outcome Measure  Difficulty turning over in bed (including adjusting bedclothes, sheets and blankets)?: A Little Difficulty moving from lying on back to sitting on the side of the bed? : A Little Difficulty sitting down on and  standing up from a chair with arms (e.g., wheelchair, bedside commode, etc,.)?: A Little Help needed moving to and from a bed to chair (including a wheelchair)?: A Little Help needed walking in hospital room?: A Little Help needed climbing 3-5 steps with a railing? : A Lot 6 Click Score: 17    End of Session Equipment Utilized During Treatment: Gait belt;Oxygen Activity Tolerance: Patient tolerated treatment well Patient left: in chair;with call bell/phone within reach Nurse Communication: Mobility status PT Visit Diagnosis: Other abnormalities of gait and mobility (R26.89);Pain Pain - part of body: Leg     Time: 8916-9450 PT Time Calculation (min) (ACUTE ONLY): 32 min  Charges:  $Gait Training: 8-22 mins $Therapeutic Activity: 8-22 mins                    G Codes:  Functional Assessment Tool Used: AM-PAC 6 Clicks Basic Mobility    Fransisca Connors, SPTA    Fransisca Connors 10/20/2017, 5:45 PM

## 2017-10-20 NOTE — Progress Notes (Signed)
   Daily Progress Note   Assessment/Planning:   POD #6 s/p ABF, R fem-pop BPG   Somewhat tolerating diet: limited appetite due to food options  +F/no BM yet: constipation prn rx ordered  Fe + Vit C ordered for acute blood loss anemia   Wean off oxygen: CXR c/w COPD due to prior smoking  Home likely in the next few days once off oxygen and PO better tolerated   Subjective  - 6 Days Post-Op   Pain controlled, amb halls with assistance, +F/-BM   Objective   Vitals:   10/19/17 1500 10/19/17 1541 10/19/17 2004 10/20/17 0343  BP:  (!) 116/96 (!) 107/51 114/69  Pulse: 83   65  Resp: (!) 21 16 17 18   Temp:  99 F (37.2 C) 99.5 F (37.5 C) 97.8 F (36.6 C)  TempSrc:   Oral Oral  SpO2: 100% 95% 93% 95%  Weight:      Height:         Intake/Output Summary (Last 24 hours) at 10/20/2017 0981 Last data filed at 10/20/2017 0300 Gross per 24 hour  Intake 960 ml  Output 1000 ml  Net -40 ml    PULM  BLL rales  CV  RRR  GI  soft, appropriate TTP, -G/R, inc c/d/i  VASC B groin inc c/d/i, bilateral calf inc c/d/i, both feet pink  NEURO DNVI    Laboratory   CBC CBC Latest Ref Rng & Units 10/19/2017 10/18/2017 10/17/2017  WBC 4.0 - 10.5 K/uL 9.0 9.0 10.2  Hemoglobin 13.0 - 17.0 g/dL 8.0(L) 8.3(L) 8.4(L)  Hematocrit 39.0 - 52.0 % 23.4(L) 25.1(L) 25.4(L)  Platelets 150 - 400 K/uL 178 158 135(L)    BMET    Component Value Date/Time   NA 136 10/19/2017 0450   K 3.4 (L) 10/19/2017 0450   CL 97 (L) 10/19/2017 0450   CO2 32 10/19/2017 0450   GLUCOSE 107 (H) 10/19/2017 0450   BUN 16 10/19/2017 0450   CREATININE 1.51 (H) 10/19/2017 0450   CALCIUM 8.0 (L) 10/19/2017 0450   GFRNONAA 47 (L) 10/19/2017 0450   GFRAA 54 (L) 10/19/2017 0450     Adele Barthel, MD, FACS [facility] [place] [facility] Office: [phone] Pager: [phone]  10/20/2017, 7:22 AM

## 2017-10-20 NOTE — Care Management Important Message (Signed)
Important Message  Patient Details  Name: Dwayne Cuevas MRN: [id number] Date of Birth: 02-17-1951   Medicare Important Message Given:  Yes    Nathen May 10/20/2017, 10:43 AM

## 2017-10-21 LAB — GLUCOSE, CAPILLARY
Glucose-Capillary: 88 mg/dL (ref 65–99)
Glucose-Capillary: 96 mg/dL (ref 65–99)
Glucose-Capillary: 97 mg/dL (ref 65–99)
Glucose-Capillary: 100 mg/dL — ABNORMAL HIGH (ref 65–99)
Glucose-Capillary: 103 mg/dL — ABNORMAL HIGH (ref 65–99)

## 2017-10-21 LAB — BASIC METABOLIC PANEL
Anion gap: 12 (ref 5–15)
BUN: 16 mg/dL (ref 6–20)
CO2: 32 mmol/L (ref 22–32)
Calcium: 8.4 mg/dL — ABNORMAL LOW (ref 8.9–10.3)
Chloride: 91 mmol/L — ABNORMAL LOW (ref 101–111)
Creatinine, Ser: 1.49 mg/dL — ABNORMAL HIGH (ref 0.61–1.24)
GFR calc Af Amer: 55 mL/min — ABNORMAL LOW (ref >60)
GFR calc non Af Amer: 48 mL/min — ABNORMAL LOW (ref >60)
Glucose, Bld: 100 mg/dL — ABNORMAL HIGH (ref 65–99)
Potassium: 2.8 mmol/L — ABNORMAL LOW (ref 3.5–5.1)
Sodium: 135 mmol/L (ref 135–145)

## 2017-10-21 LAB — CBC
HCT: 27.7 % — ABNORMAL LOW (ref 39.0–52.0)
Hemoglobin: 9.1 g/dL — ABNORMAL LOW (ref 13.0–17.0)
MCH: 29.4 pg (ref 26.0–34.0)
MCHC: 32.9 g/dL (ref 30.0–36.0)
MCV: 89.6 fL (ref 78.0–100.0)
Platelets: 281 10*3/uL (ref 150–400)
RBC: 3.09 MIL/uL — ABNORMAL LOW (ref 4.22–5.81)
RDW: 13 % (ref 11.5–15.5)
WBC: 10.1 10*3/uL (ref 4.0–10.5)

## 2017-10-21 MED ORDER — POTASSIUM CHLORIDE CRYS ER 20 MEQ PO TBCR
40.0000 meq | EXTENDED_RELEASE_TABLET | Freq: Two times a day (BID) | ORAL | Status: AC
  Administered 2017-10-21 (×2): 40 meq via ORAL
  Filled 2017-10-21 (×2): qty 2

## 2017-10-21 NOTE — Progress Notes (Addendum)
  Progress Note    10/21/2017 7:19 AM 7 Days Post-Op  Subjective:  Denies rest pain BLE. + flatus but no bowel movement   Vitals:   10/20/17 2333 10/21/17 0357  BP: (!) 114/58 (!) 107/93  Pulse:    Resp:    Temp: 99.1 F (37.3 C) 98.8 F (37.1 C)  SpO2:     Physical Exam: Cardiac:  RRR Lungs:  B lung fields clear to auscultation Incisions:  B groin incisions soft without palpable hematoma; R lower leg incision without active drainage or obvious infection; palpable R AT; feet symmetrically warm to touch with good capillary refill Abdomen:  Soft; abd incision stable; normoactive bowel sounds Neurologic: A&O  CBC    Component Value Date/Time   WBC 10.1 10/21/2017 0437   RBC 3.09 (L) 10/21/2017 0437   HGB 9.1 (L) 10/21/2017 0437   HCT 27.7 (L) 10/21/2017 0437   PLT 281 10/21/2017 0437   MCV 89.6 10/21/2017 0437   MCH 29.4 10/21/2017 0437   MCHC 32.9 10/21/2017 0437   RDW 13.0 10/21/2017 0437   LYMPHSABS 3.1 08/05/2017 2301   MONOABS 0.6 08/05/2017 2301   EOSABS 0.1 08/05/2017 2301   BASOSABS 0.0 08/05/2017 2301    BMET    Component Value Date/Time   NA 135 10/21/2017 0437   K 2.8 (L) 10/21/2017 0437   CL 91 (L) 10/21/2017 0437   CO2 32 10/21/2017 0437   GLUCOSE 100 (H) 10/21/2017 0437   BUN 16 10/21/2017 0437   CREATININE 1.49 (H) 10/21/2017 0437   CALCIUM 8.4 (L) 10/21/2017 0437   GFRNONAA 48 (L) 10/21/2017 0437   GFRAA 55 (L) 10/21/2017 0437    INR    Component Value Date/Time   INR 1.37 10/14/2017 1753     Intake/Output Summary (Last 24 hours) at 10/21/2017 0719 Last data filed at 10/21/2017 0500 Gross per 24 hour  Intake 10 ml  Output 800 ml  Net -790 ml     Assessment/Plan:  66 y.o. male is s/p ABF, R fem-pop BPG 7 Days Post-Op   Weaning oxygen; still on 2L by [place] this morning Flatus however no bowel movement; patient is hungry this morning Monitor peripheral signals D/c possibly tomorrow if tolerating po and weaned from O2    Dagoberto Ligas, PA-C [facility] [phone] 10/21/2017  7:19 AM   Addendum  I have independently interviewed and examined the patient, and I agree with the physician assistant's findings.  Took off [place] oxygen. SaO2 90-97%.  Waveforms in pulse ox variable.    - Ordered KDur 40 meq PO bid x 2 for hypokalemia (likely related to Lasix).  Recheck K tomorrow - Take off [place] oxygen and walk patient.  Ok for SaO2 >85% given significant COPD evident on CXR - Would prefer pt have BM prior to discharge.  Reportedly passing flatus and tolerating PO - If oxygen level acceptable today and K ok, home tomorrow.  Staples out next Friday   Adele Barthel, MD, FACS [facility] [place] Office: [phone] Pager: [phone]  10/21/2017, 7:59 AM

## 2017-10-22 LAB — BASIC METABOLIC PANEL
Anion gap: 12 (ref 5–15)
BUN: 15 mg/dL (ref 6–20)
CO2: 31 mmol/L (ref 22–32)
Calcium: 8.5 mg/dL — ABNORMAL LOW (ref 8.9–10.3)
Chloride: 93 mmol/L — ABNORMAL LOW (ref 101–111)
Creatinine, Ser: 1.37 mg/dL — ABNORMAL HIGH (ref 0.61–1.24)
GFR calc Af Amer: >60 mL/min (ref >60)
GFR calc non Af Amer: 53 mL/min — ABNORMAL LOW (ref >60)
Glucose, Bld: 99 mg/dL (ref 65–99)
Potassium: 3.7 mmol/L (ref 3.5–5.1)
Sodium: 136 mmol/L (ref 135–145)

## 2017-10-22 LAB — GLUCOSE, CAPILLARY
Glucose-Capillary: 103 mg/dL — ABNORMAL HIGH (ref 65–99)
Glucose-Capillary: 94 mg/dL (ref 65–99)
Glucose-Capillary: 95 mg/dL (ref 65–99)

## 2017-10-22 MED ORDER — OXYCODONE-ACETAMINOPHEN 5-325 MG PO TABS: 1.0000 | ORAL_TABLET | Freq: Four times a day (QID) | ORAL | 0 refills | Status: AC | PRN

## 2017-10-22 NOTE — Discharge Summary (Signed)
AAA Discharge Summary    Dwayne Cuevas 1951/05/16 66 y.o. male  [id number]  Admission Date: 10/14/2017  Discharge Date: 10/22/17  Physician: Dwayne [place], MD  Admission Diagnosis: Aortoiliac occlusive disease [facility]) [I74.09]  Discharge Day services:   See progress note 12/5  Physical Exam: Vitals:   10/21/17 2002 10/22/17 0326  BP: (!) 144/63 (!) 148/62  Pulse:    Resp:    Temp: 98.9 F (37.2 C) 99.1 F (37.3 C)  SpO2: 94%    Cardiac:  RRR Lungs: No increased respiratory effort Extremities: Bilateral groin incisions are soft without drainage or obvious infection; skin edges approximated well; pitting edema right lower leg to the level of the mid shin; palpable right AT; feet are symmetrically warm to touch Abdomen: Midline incision soft without obvious infection; staples in place with incision edges approximated well Neurologic: Alert and oriented and moving all extremities well   Hospital Course:  The patient was admitted to the hospital and taken to the operating room on 10/14/2017 and underwent: Aorta bifemoral bypass with right femoral to below the knee popliteal artery bypass with PTFE conduit by Dr. Bridgett Cuevas.  Intraoperatively patient experienced severe hypotension not responsive to vasopressor support.  Emergency measures were initiated by anesthesia.  Eventually blood pressure recovered.  Left tibial exploration however was aborted.  Intraoperative left leg arteriogram and thrombectomy also performed.  Patient was brought postoperatively to the ICU in stable condition.  POD #1 patient was extubated and no longer required pressor support to maintain adequate perfusion.  Patient was transferred to the stepdown unit on 11/29.  The remainder of hospital stay consisted of p.o. titration, diffuse postoperative pain control, and monitoring of peripheral circulation.  PT and OT were also asked to consult on the patient.  He denies productive cough it was recommended that  patient continue home physical therapy services due to weakness and deconditioning of bilateral lower extremities.  It should also be mentioned that patient received preoperative cardiac clearance with normal stress testing.  Cardiology was consulted due to intraoperative hypotension however there is no recommendation for intervention from cardiology during hospital stay.  Patient's diet was advanced throughout hospital stay and is now tolerating a regular diet.  He is ambulating with a walker and assistance during hospitalization and his pastor and has agreed to lend him a walker when he returns home.  He is feeling fit for discharge today.  Patient will be prescribed Percocet 5/325 mg for continued postoperative pain control.  He will follow-up in office next Friday 12/14 for abdominal incision staple removal as well as to evaluate circulatory status.  Discharge instructions were reviewed with the patient and he voices his understanding.  He will be discharged this morning in stable condition.    CBC    Component Value Date/Time   WBC 10.1 10/21/2017 0437   RBC 3.09 (L) 10/21/2017 0437   HGB 9.1 (L) 10/21/2017 0437   HCT 27.7 (L) 10/21/2017 0437   PLT 281 10/21/2017 0437   MCV 89.6 10/21/2017 0437   MCH 29.4 10/21/2017 0437   MCHC 32.9 10/21/2017 0437   RDW 13.0 10/21/2017 0437   LYMPHSABS 3.1 08/05/2017 2301   MONOABS 0.6 08/05/2017 2301   EOSABS 0.1 08/05/2017 2301   BASOSABS 0.0 08/05/2017 2301    BMET    Component Value Date/Time   NA 136 10/22/2017 0416   K 3.7 10/22/2017 0416   CL 93 (L) 10/22/2017 0416   CO2 31 10/22/2017 0416   GLUCOSE 99  10/22/2017 0416   BUN 15 10/22/2017 0416   CREATININE 1.37 (H) 10/22/2017 0416   CALCIUM 8.5 (L) 10/22/2017 0416   GFRNONAA 53 (L) 10/22/2017 0416   GFRAA >60 10/22/2017 0416       Discharge Diagnosis:  Aortoiliac occlusive disease (Dwayne Cuevas) [I74.09]  Secondary Diagnosis: Patient Active Problem List   Diagnosis Date Noted  .  Respiratory failure ([place])   . Acute pulmonary edema ([facility])   . Acute encephalopathy   . Essential hypertension   . Aortoiliac occlusive disease ([name]) 10/14/2017  . Critical lower limb ischemia 08/29/2017  . History of colonic polyps 07/30/2016  . DVT (deep venous thrombosis) ([name]) 02/06/2016  . Pulmonary emboli ([name]) 02/05/2016  . CERVICAL SPONDYLOSIS WITHOUT MYELOPATHY 07/16/2010  . TRIGGER FINGER 07/16/2010   Past Medical History:  Diagnosis Date  . Cancer ([name])    skin cancer removed from leg and head  . COPD (chronic obstructive pulmonary disease) ([name])   . Coronary artery disease    "a couple blockages on each side of my heart" pt reports this but has not sure if he has had a cath  . DVT (deep venous thrombosis) ([name]) 02/05/2016  . Gout   . Hypercholesteremia   . Hypertension   . PE (pulmonary embolism) 02/05/2016  . Scoliosis      Allergies as of 10/22/2017      Reactions   Bee Venom    Swelling, shortness of breathe   Shellfish Allergy Other (See Comments)   Gout, not allergy, patient doesn't eat because it causes gout   Penicillins Rash, Other (See Comments)   Has patient had a PCN reaction causing immediate rash, facial/tongue/throat swelling, SOB or lightheadedness with hypotension: Yes Has patient had a PCN reaction causing severe rash involving mucus membranes or skin necrosis: No Has patient had a PCN reaction that required hospitalization No Has patient had a PCN reaction occurring within the last 10 years: No If all of the above answers are "NO", then may proceed with Cephalosporin use.      Medication List    TAKE these medications   allopurinol 300 MG tablet Commonly known as:  ZYLOPRIM Take 1 tablet (300 mg total) by mouth daily. What changed:  when to take this   aspirin EC 81 MG tablet Take 81 mg by mouth daily.   co-enzyme Q-10 30 MG capsule Take 30 mg by mouth daily.   Cod Liver Oil Caps Take 1 capsule by mouth at bedtime.     enalapril-hydrochlorothiazide 10-25 MG tablet Commonly known as:  VASERETIC Take 1 tablet by mouth daily.   lovastatin 40 MG tablet Commonly known as:  MEVACOR Take 40 mg by mouth every other day.   oxyCODONE-acetaminophen 5-325 MG tablet Commonly known as:  PERCOCET/ROXICET Take 1-2 tablets every 4 (four) hours as needed by mouth for moderate pain. What changed:  Another medication with the same name was added. Make sure you understand how and when to take each.   oxyCODONE-acetaminophen 5-325 MG tablet Commonly known as:  ROXICET Take 1 tablet by mouth every 6 (six) hours as needed for up to 2 days. What changed:  You were already taking a medication with the same name, and this prescription was added. Make sure you understand how and when to take each.   Vitamin E 100 units Tabs Take 100 Units by mouth daily.       Instructions:  [facility] Discharge Instructions Open Aortic Surgery  Please refer to  the following instructions for your post-procedure care. Your surgeon or Physician Assistant will discuss any changes with you.  Activity  Avoid lifting more than eight pounds (a gallon of milk) until after your first post-operative visit. You are encouraged to walk as much as you can. You can slowly return to normal activities but must avoid strenuous activity and heavy lifting until your doctor tells you it's OK. Heavy lifting can hurt the incision and cause a hernia. Avoid activities such as vacuuming or swinging a golf club. It is normal to feel tired for several weeks after your surgery. Do not drive until your doctor gives the OK and you are no longer taking prescription pain medications. It is also normal to have difficulty with sleep habits, eating and bowl movements after surgery. These will go away with time.  Bathing/Showering  You may shower after you go home. Do not soak in a bathtub, hot tub, or swim until the incision  heals.  Incision Care  Shower every day. Clean your incision with mild soap and water. Pat the area dry with a clean towel. You do not need a bandage unless otherwise instructed. Do not apply any ointments or creams to your incision. You may have skin glue on your incision. Do not peel it off. It will come off on its own in about one week. If you have staples or sutures along your incision, they will be removed at your post op appointment.  If you have groin incisions, wash the groin wounds with soap and water daily and pat dry. (No tub bath-only shower)  Then put a dry gauze or washcloth in the groin to keep this area dry to help prevent wound infection.  Do this daily and as needed.  Do not use Vaseline or neosporin on your incisions.  Only use soap and water on your incisions and then protect and keep dry.  Diet  Resume your normal diet. There are no special food restriction following this procedure. A low fat/low cholesterol diet is recommended for all patients with vascular disease. After your aortic surgery, it's normal to feel full faster than usual and to not feel as hungry as you normally would. You will probably lose weight initially following your surgery. It's best to eat small, frequent meals over the course of the day. Call the office if you find that you are unable to eat even small meals. In order to heal from your surgery, it is CRITICAL to get adequate nutrition. Your body requires vitamins, minerals, and protein. Vegetables are the best source of vitamins and minerals. causing pain, you may take over-the-counter pain reliever such as acetaminophen (Tylenol). If you were prescribed a stronger pain medication, please be aware these medication can cause nausea and constipation. Prevent nausea by taking the medication with a snack or meal. Avoid constipation by drinking plenty of fluids and eating foods with a high amount of fiber, such as fruits, vegetables and grains. Take 100mg  of the  over-the-counter stool softener Colace twice a day as needed to help with constipation. A laxative, such as Milk of Magnesia, may be recommended for you at this time. Do not take a laxative unless your surgeon or Physician Assistant. tells you it's OK. Do not take Tylenol if you are taking stronger pain medications (such as Percocet).  Follow Up  Our office will schedule a follow up appointment 2-3 weeks after discharge.  Please call us immediately for any of the following conditions    .  Severe or worsening pain in your legs or feet or in your abdomen back or chest. Increased pain, redness drainage (pus) from your incision site. Increased abdominal pain, bloating, nausea, vomiting, or persistent diarrhea. Fever of 101 degrees or higher. Swelling in your leg (s).  Reduce your risk of vascular disease  Stop smoking. If you would like help, call [facility] at 1-800-QUIT-NOW 704-455-5517) or [place] at (682)[phone]. Manage your cholesterol Maintain a desired weight Control your diabetes Keep your blood pressure down  If you have any questions please call the office at [phone].    Disposition: home with home PT  Patient's condition: is Fair  Follow up: 1. Dr. Bridgett Cuevas in 2 weeks   Dagoberto Ligas, PA-C [facility] [phone] 10/22/2017  8:38 AM   Addendum  Dwayne Cuevas is a 66 y.o. 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  I had previous done an aortogram with bilateral runoff on this patient and stented a severely diseased left common iliac artery during that procedure in case his cardiac risk stratification was severe.  His work-up with Cardiology lead to his preoperative cardiac clearance, so I offered him aortobifemoral bypass and possible right femoropopliteal bypass to address his rest pain.  His aortobifemoral bypass was completed with minor complication of some  embolization into left femoropopliteal segment.  This was resolved with thrombectomy.  I then elected to proceed with R CFA to BK pop bypass with Propaten, as the vein was inadequate in both legs despite vein mapping otherwise.  Shortly after completion of the fem-pop bypass, the left foot appeared to somewhat ischemic, so it was felt a tibial exploration from the popliteal exposure was indicated.  His blood pressure suddenly decompensated and his blood pressure went into the 40s despite vasopressors and clamping the aortic graft.  The patient was closed at all incisions while active vasopressor support and volume resuscitation was completed.  It is unclear to me if this patient had a reaction to the BRAT, I.e. cellsaver recycled autologous blood.  He remained intubated and sedated while recovering from this hemodynamic collapse for a little over 1-2 days.  His hemodynamic instability resolved witih relatively minor resuscitation, again raising questions of the etiology of the shock.  The rest of his admission was unremarkable.  BLE ABI demonstrated augmentation of blood flow and eventually his L foot's ischemic appearance resolved, suggesting possible digital artery thromboembolism that resolved.  The patient will follow up in the office this Friday for staple removal.  He will come back for my evaluation in 2 weeks.   Adele Barthel, MD, FACS [facility] [place] Office: [phone] Pager: [phone]  10/22/2017, 3:53 PM     - For VQI Registry use -  Post-op:  Time to Extubation: []  In OR, [ ]  < 12 hrs, [ x] 12-24 hrs, [ ]  >=24 hrs Vasopressors Req. Post-op: Yes ICU Stay: 2 day in ICU Transfusion: Yes   If yes, 2 units given MI: No, [ ]  Troponin only, [ ]  EKG or Clinical New Arrhythmia: No  Complications: CHF: No Resp failure: Yes, [ ]  Pneumonia, [ ]  Ventilator Chg in renal function: No, [ ]  Inc. Cr > 0.5, [ ]  Temp. Dialysis, [ ]  Permanent dialysis Leg ischemia: No,  no Surgery needed, [ ]  Yes, Surgery needed, [ ]  Amputation Bowel ischemia: No, [ ]  Medical Rx, [ ]  Surgical Rx Wound complication: No, [ ]  Superficial  separation/infection, [ ]  Return to OR Return to OR: No  Return to OR for bleeding: No Stroke: No, [ ]  Minor, [ ]  Major  Discharge medications: Statin use:  Yes If No:   ASA use:  Yes  If No:   Plavix use:  No  Beta blocker use:  Yes  ACEI use:  Yes ARB use:  No CCB use:  No Coumadin use:  No

## 2017-10-22 NOTE — Progress Notes (Signed)
   Daily Progress Note   Assessment/Planning:   POD #8 s/p ABF, R fem-pop BPG   Ok to D/C today  D/C staples this coming Friday   Subjective  - 8 Days Post-Op   +BM/+F, tolerating ambulation   Objective   Vitals:   10/21/17 1323 10/21/17 1500 10/21/17 2002 10/22/17 0326  BP: (!) 132/55  (!) 144/63 (!) 148/62  Pulse: 90     Resp: 19     Temp: 99.3 F (37.4 C)  98.9 F (37.2 C) 99.1 F (37.3 C)  TempSrc: Oral  Oral Oral  SpO2: 96% 94% 94%   Weight:      Height:         Intake/Output Summary (Last 24 hours) at 10/22/2017 0831 Last data filed at 10/22/2017 0754 Gross per 24 hour  Intake 260 ml  Output 1600 ml  Net -1340 ml    PULM  CTAB  CV  RRR  GI  soft, NTND  VASC B feet viable and warm  NEURO DNVI    Laboratory   CBC CBC Latest Ref Rng & Units 10/21/2017 10/19/2017 10/18/2017  WBC 4.0 - 10.5 K/uL 10.1 9.0 9.0  Hemoglobin 13.0 - 17.0 g/dL 9.1(L) 8.0(L) 8.3(L)  Hematocrit 39.0 - 52.0 % 27.7(L) 23.4(L) 25.1(L)  Platelets 150 - 400 K/uL 281 178 158    BMET    Component Value Date/Time   NA 136 10/22/2017 0416   K 3.7 10/22/2017 0416   CL 93 (L) 10/22/2017 0416   CO2 31 10/22/2017 0416   GLUCOSE 99 10/22/2017 0416   BUN 15 10/22/2017 0416   CREATININE 1.37 (H) 10/22/2017 0416   CALCIUM 8.5 (L) 10/22/2017 0416   GFRNONAA 53 (L) 10/22/2017 0416   GFRAA >60 10/22/2017 0416     Adele Barthel, MD, FACS [facility] [place] Office: [phone] Pager: [phone]  10/22/2017, 8:31 AM

## 2017-10-22 NOTE — Progress Notes (Signed)
Physical Therapy Treatment Patient Details Name: Dwayne Cuevas MRN: [id number] DOB: September 23, 1951 Today's Date: 10/22/2017    History of Present Illness Pt is a 66 y.o. male admitted on 10/14/17 with an abdominal aortic aneurysm and aortoiliac occlusive disease. Pt now s/p aortobifemoral bypass, R femoral below knee popliteal bypass, and L femoral embolectomy on 10/14/17. Pertinent PMH includes scoliosis, PE, HTN, gout, DVT, CAD, COPD.    PT Comments    Pt reported that he was feeling a little tired this AM declined ambulation at first but was willing to participate once educated on the importance of practicing stairs before d/c.Pt mod I for bed mobility and min guard during transfers for safety. O2 desat to 85% on room air during ambulation. Switched to O2 2L/min [place]. Sats >90%. Pt heavy WB with BUE on R rail when ascending and descending stairs due to increased pain in RLE. Pt min guard-min assist +2 for safety/equipment. At end of session pt back on room air. O2 >90% while resting in recliner. Pt will continue to benefit from therapy to help increase their independence and safety with mobility  Follow Up Recommendations  Home health PT;Supervision - Intermittent     Equipment Recommendations  Other (comment)(TBD)    Recommendations for Other Services       Precautions / Restrictions Precautions Precautions: Fall Restrictions Weight Bearing Restrictions: No    Mobility  Bed Mobility Overal bed mobility: Modified Independent             General bed mobility comments: increased time and effort  Transfers Overall transfer level: Needs assistance Equipment used: Rolling walker (2 wheeled) Transfers: Sit to/from Stand Sit to Stand: Min guard         General transfer comment: min guard for safety  Ambulation/Gait Ambulation/Gait assistance: Min guard Ambulation Distance (Feet): 210 Feet Assistive device: Rolling walker (2 wheeled) Gait Pattern/deviations: Step-through  pattern;Decreased stride length;Trunk flexed;Antalgic;Step-to pattern Gait velocity: Decreased   General Gait Details: slow steady antalgic gait. VC for erect posuture and increased step length on L. Pt ambulated on 2L/min O2 with sats staying >90%.  Required seated rest break due to increased pain in RLE.   Stairs Stairs: Yes   Stair Management: One rail Right Number of Stairs: 5 General stair comments: min assist + 2 for safety and equipment. Pt with heavy WB on rail when ascend and descending stairs due to increase pain in R and decreased ROM in L ankle. Pt able to ascend forward but had increased pain in R knee. Pt turned sideways to descend stairs with heavy WB with BUE to descend. VC for proper hand placement and sequencing when ascending and descending stairs.  Wheelchair Mobility    Modified Rankin (Stroke Patients Only)       Balance Overall balance assessment: Needs assistance Sitting-balance support: No upper extremity supported;Feet supported Sitting balance-Leahy Scale: Good     Standing balance support: Bilateral upper extremity supported;No upper extremity supported Standing balance-Leahy Scale: Poor Standing balance comment: Reliant on RW for BUE support                            Cognition Arousal/Alertness: Awake/alert Behavior During Therapy: WFL for tasks assessed/performed Overall Cognitive Status: Within Functional Limits for tasks assessed  Exercises      General Comments        Pertinent Vitals/Pain Pain Score: 5  Pain Location: Abdomen, R LE Pain Descriptors / Indicators: Discomfort;Grimacing Pain Intervention(s): Monitored during session    Home Living                      Prior Function            PT Goals (current goals can now be found in the care plan section) Acute Rehab PT Goals Patient Stated Goal: not discussed Progress towards PT goals: Progressing  toward goals    Frequency    Min 3X/week      PT Plan Current plan remains appropriate    Co-evaluation              AM-PAC PT "6 Clicks" Daily Activity  Outcome Measure  Difficulty turning over in bed (including adjusting bedclothes, sheets and blankets)?: A Little Difficulty moving from lying on back to sitting on the side of the bed? : A Little Difficulty sitting down on and standing up from a chair with arms (e.g., wheelchair, bedside commode, etc,.)?: A Little Help needed moving to and from a bed to chair (including a wheelchair)?: A Little Help needed walking in hospital room?: A Little Help needed climbing 3-5 steps with a railing? : A Lot 6 Click Score: 17    End of Session Equipment Utilized During Treatment: Gait belt;Oxygen Activity Tolerance: Patient tolerated treatment well Patient left: in chair;with call bell/phone within reach Nurse Communication: Mobility status PT Visit Diagnosis: Other abnormalities of gait and mobility (R26.89);Pain Pain - part of body: Leg     Time: 4098-1191 PT Time Calculation (min) (ACUTE ONLY): 32 min  Charges:  $Gait Training: 8-22 mins $Therapeutic Activity: 8-22 mins                    G Codes:  Functional Assessment Tool Used: AM-PAC 6 Clicks Basic Mobility    Fransisca Connors, SPTA    Fransisca Connors 10/22/2017, 2:56 PM

## 2017-10-22 NOTE — Care Management Important Message (Signed)
Important Message  Patient Details  Name: JAQWON MANFRED MRN: [id number] Date of Birth: 1951/07/07   Medicare Important Message Given:  Yes    Robynn Marcel Abena 10/22/2017, 10:16 AM

## 2017-10-22 NOTE — Progress Notes (Signed)
Patient given AVS and enabled to ask questions. Patient given prescriptions and all questions answered. Patient taken down to D/C lobby where pastor is waiting for him.

## 2017-10-22 NOTE — Care Management Note (Signed)
Case Management Note  Patient Details  Name: Dwayne Cuevas MRN: [id number] Date of Birth: 05-20-1951  Subjective/Objective:   Pt is s/pABF, R CFA to BK pop BPG complicated by intraop hypotension of unknown etiology leading to ARF (resolving)  Action/Plan:  PTA independent from home.  Pt has PCP and denied barriers to obtaining medications.  Pt will transport home via private vehicle driven by pts pastor.  CM requested HHPT order via bedside nurse.  Pt was referred to [facility] and agency is following for potential discharge needs.  CM will continue to follow for discharge needs   Expected Discharge Date:  10/22/17               Expected Discharge Plan:  [place]  In-House Referral:  NA  Discharge planning Services  CM Consult  Post Acute Care Choice:  Home Health Choice offered to:  Patient  DME Arranged:  N/A DME Agency:  NA  HH Arranged:  PT HH Agency:  [facility]  Status of Service:  Completed, signed off  If discussed at [place] of Stay Meetings, dates discussed:    Discharge Disposition: home/home health   Additional Comments:  10/22/17- 1020- Lola Lofaro RN, CM- pt for d/c home today- pre-op referral to [facility]- spoke with pt at bedside- pt has letter from [facility] and has spoken with them regarding [facility] services- notified Tiffany with [facility] of pt's discharge today- pt also states that he has a RW and shower chair to use that he has borrowed from [employer]- he also has his landlord fixing his bathroom with a grab bar and new shower head. Pt's pastor to come provide transport home.   Dahlia Client [place], RN 10/22/2017, 10:21 AM [phone]

## 2017-10-22 NOTE — Discharge Instructions (Signed)
 [facility] [place]  Discharge Instructions   Open Aortic Surgery  Please refer to the following instructions for your post-procedure care. Your surgeon or Physician Assistant will discuss any changes with you.  Activity  Avoid lifting more than eight pounds (a gallon of milk) until after your first post-operative visit. You are encouraged to walk as much as you can. You can slowly return to normal activities but must avoid strenuous activity and heavy lifting until your doctor tells you it's OK. Heavy lifting can hurt the incision and cause a hernia. Avoid activities such as vacuuming or swinging a golf club. It is normal to feel tired for several weeks after your surgery. Do not drive until your doctor gives the OK and you are no longer taking prescription pain medications. It is also normal to have difficulty with sleep habits, eating and bowl movements after surgery. These will go away with time.  Bathing/Showering  You may shower after you go home. Do not soak in a bathtub, hot tub, or swim until the incision heals.  Incision Care  Shower every day. Clean your incision with mild soap and water. Pat the area dry with a clean towel. You do not need a bandage unless otherwise instructed. Do not apply any ointments or creams to your incision. You may have skin glue on your incision. Do not peel it off. It will come off on its own in about one week. If you have staples or sutures along your incision, they will be removed at your post op appointment.  Diet  Resume your normal diet. There are no special food restriction following this procedure. A low fat/low cholesterol diet is recommended for all patients with vascular disease. After your aortic surgery, it's normal to feel full faster than usual and to not feel as hungry as you normally would. You will probably lose weight initially following your surgery. It's best to eat small, frequent meals over the course of  the day. Call the office if you find that you are unable to eat even small meals. In order to heal from your surgery, it is CRITICAL to get adequate nutrition. Your body requires vitamins, minerals, and protein. Vegetables are the best source of vitamins and minerals. causing pain, you may take over-the-counter pain reliever such as acetaminophen (Tylenol). If you were prescribed a stronger pain medication, please be aware these medication can cause nausea and constipation. Prevent nausea by taking the medication with a snack or meal. Avoid constipation by drinking plenty of fluids and eating foods with a high amount of fiber, such as fruits, vegetables and grains. Take 100mg of the over-the-counter stool softener Colace twice a day as needed to help with constipation. A laxative, such as Milk of Magnesia, may be recommended for you at this time. Do not take a laxative unless your surgeon or P.A. tells you it's OK. Do not take Tylenol if you are taking stronger pain medications (such as Percocet).  Follow Up  Our office will schedule a follow up appointment 2-3 weeks after discharge.  Please call us immediately for any of the following conditions    .     Severe or worsening pain in your legs or feet or in your abdomen back or chest. Increased pain, redness drainage (pus) from your incision site. Increased abdominal pain, bloating, nausea, vomiting, or persistent diarrhea. Fever of 101 degrees or higher. Swelling in your leg (s).  Reduce your risk of vascular disease  Stop smoking.   If you would like help, call [facility] at 1-800-QUIT-NOW (1-800-784-8669) or [place] at 336-586-4000. Manage your cholesterol Maintain a desired weight Control your diabetes Keep your blood pressure down  If you have any questions please call the office at [phone].   

## 2017-10-23 ENCOUNTER — Telehealth: Payer: Self-pay | Admitting: Vascular Surgery

## 2017-10-23 DIAGNOSIS — R2689 Other abnormalities of gait and mobility: Secondary | ICD-10-CM | POA: Diagnosis not present

## 2017-10-23 DIAGNOSIS — J449 Chronic obstructive pulmonary disease, unspecified: Secondary | ICD-10-CM | POA: Diagnosis not present

## 2017-10-23 DIAGNOSIS — Z87891 Personal history of nicotine dependence: Secondary | ICD-10-CM | POA: Diagnosis not present

## 2017-10-23 DIAGNOSIS — M6281 Muscle weakness (generalized): Secondary | ICD-10-CM | POA: Diagnosis not present

## 2017-10-23 DIAGNOSIS — I2510 Atherosclerotic heart disease of native coronary artery without angina pectoris: Secondary | ICD-10-CM | POA: Diagnosis not present

## 2017-10-23 DIAGNOSIS — Z48812 Encounter for surgical aftercare following surgery on the circulatory system: Secondary | ICD-10-CM | POA: Diagnosis not present

## 2017-10-23 NOTE — Telephone Encounter (Signed)
Sched staple removal 3:15 10/24/17. Sched MD 11/21/17 at 1:30. Spoke to pt.

## 2017-10-23 NOTE — Telephone Encounter (Signed)
-----   Message from Mena Goes, RN sent at 10/22/2017 10:33 AM EST ----- Regarding: 2 appts,   Friday staple removal and then 2 weeks    ----- Message ----- From: Conrad Prichard, MD Sent: 10/22/2017   8:32 AM To: [facility] [address]  Dwayne Cuevas [id number] [birth year]  Follow-up s/p ABF, R fem-pop BPG:  1.  Follow up Friday for staple removal (Nurse visit) 2.  MD follow up in 2 weeks

## 2017-10-24 ENCOUNTER — Ambulatory Visit (INDEPENDENT_AMBULATORY_CARE_PROVIDER_SITE_OTHER): Payer: Medicare Other | Admitting: Family

## 2017-10-24 ENCOUNTER — Encounter: Payer: Self-pay | Admitting: Family

## 2017-10-24 VITALS — BP 124/51 | HR 79 | Temp 97.6°F | Resp 18 | Wt 188.9 lb

## 2017-10-24 DIAGNOSIS — Z4802 Encounter for removal of sutures: Secondary | ICD-10-CM

## 2017-10-24 DIAGNOSIS — Z95828 Presence of other vascular implants and grafts: Secondary | ICD-10-CM

## 2017-10-24 DIAGNOSIS — I7409 Other arterial embolism and thrombosis of abdominal aorta: Secondary | ICD-10-CM

## 2017-10-24 MED ORDER — CIPROFLOXACIN HCL 500 MG PO TABS: 500.0000 mg | ORAL_TABLET | Freq: Two times a day (BID) | ORAL | 0 refills | Status: DC

## 2017-10-24 NOTE — Progress Notes (Signed)
Postoperative Visit   History of Present Illness  Dwayne Cuevas is a 66 y.o. year old male who presents for staples removal s/p aortobifemoral bypass, right iliofemoral endarterectomy, left leg angiogram, left leg thrombectomy, right common femoral artery to below-the-knee popliteal artery bypass with Propaten, and aborted left tibial exploration on 10-14-17 by Dr. [name] and Dr. Scot Dock.  He is moving his bowels. He states he has felt cold at times, but no fever.  His right groin incision is draining watery pink tinged drainage, moderate amount, no odor, through his underwear to his pants; started draining yesterday.  He has swelling in his right leg, states he is elevatiing his feet aove his heart, but tends to sleep on his right side.   Pt is scheduled on 11-21-17 with Dr. [name] for post operative follow up.   The patient's midline abdominal, bilateral legs, and left groin indicisions are healing well; right groin incision is draining moderate amount of serous drainage.  The patient notes resolution of lower extremity symptoms.  The patient is able to complete their activities of daily living.   He states he quit smoking 3 weeks ago.  He does not have DM.     For VQI Use Only  PRE-ADM LIVING: Home  AMB STATUS: Ambulatory with walker    Past Medical History:  Diagnosis Date  . Cancer ([place])    skin cancer removed from leg and head  . COPD (chronic obstructive pulmonary disease) ([place])   . Coronary artery disease    "a couple blockages on each side of my heart" pt reports this but has not sure if he has had a cath  . DVT (deep venous thrombosis) ([place]) 02/05/2016  . Gout   . Hypercholesteremia   . Hypertension   . PE (pulmonary embolism) 02/05/2016  . Scoliosis     Past Surgical History:  Procedure Laterality Date  . ABDOMINAL AORTOGRAM W/LOWER EXTREMITY N/A 09/04/2017   Procedure: ABDOMINAL AORTOGRAM W/LOWER EXTREMITY;  Surgeon: Conrad [place], MD;  Location: [facility]  [facility];  Service: Cardiovascular;  Laterality: N/A;  . AORTA - BILATERAL FEMORAL ARTERY BYPASS GRAFT Bilateral 10/14/2017   Procedure: AORTA BIFEMORAL BYPASS GRAFT;  Surgeon: Conrad [place], MD;  Location: [facility];  Service: Vascular;  Laterality: Bilateral;  . FEMORAL-POPLITEAL BYPASS GRAFT Right 10/14/2017   Procedure: Right FEMORAL-POPLITEAL ARTERY Bypass graft using gortex graft;  Surgeon: Conrad [place], MD;  [facility];  Service: Vascular;  Laterality: Right;  . FOOT SURGERY Left    "put my foot back together"   . INTRAOPERATIVE ARTERIOGRAM Left 10/14/2017   Procedure: INTRA OPERATIVE ARTERIOGRAM;  Surgeon: Conrad [place], MD;  Location: [facility];  Service: Vascular;  Laterality: Left;  . NECK SURGERY     discs removed, steel plate  . PERIPHERAL VASCULAR INTERVENTION Left 09/04/2017   Procedure: PERIPHERAL VASCULAR INTERVENTION;  Surgeon: Conrad [place], MD;  Location: [facility];  Service: Cardiovascular;  Laterality: Left;  left common iliac  . SKIN CANCER EXCISION    . THROMBECTOMY FEMORAL ARTERY Left 10/14/2017   Procedure: THROMBECTOMY Left Lower leg;  Surgeon: Conrad [place], MD;  [facility];  Service: Vascular;  Laterality: Left;    Social History   Socioeconomic History  . Marital status: Divorced    Spouse name: Not on file  . Number of children: Not on file  . Years of education: Not on file  . Highest education level: Not on file  Social Needs  . Financial resource strain: Not on file  . Food insecurity - worry: Not on file  . Food insecurity - inability: Not on file  . Transportation needs - medical: Not on file  . Transportation needs - non-medical: Not on file  Occupational History  . Not on file  Tobacco Use  . Smoking status: Former Smoker    Types: Cigarettes    Last attempt to quit: 08/31/2017    Years since quitting: 0.1  . Smokeless tobacco: Never Used  Substance and Sexual Activity  . Alcohol use: No    Frequency: Never  . Drug use: No  .  Sexual activity: Not on file  Other Topics Concern  . Not on file  Social History Narrative  . Not on file    Allergies  Allergen Reactions  . Bee Venom     Swelling, shortness of breathe  . Shellfish Allergy Other (See Comments)    Gout, not allergy, patient doesn't eat because it causes gout  . Penicillins Rash and Other (See Comments)    Has patient had a PCN reaction causing immediate rash, facial/tongue/throat swelling, SOB or lightheadedness with hypotension: Yes Has patient had a PCN reaction causing severe rash involving mucus membranes or skin necrosis: No Has patient had a PCN reaction that required hospitalization No Has patient had a PCN reaction occurring within the last 10 years: No If all of the above answers are "NO", then may proceed with Cephalosporin use.     Current Outpatient Medications on File Prior to Visit  Medication Sig Dispense Refill  . allopurinol (ZYLOPRIM) 300 MG tablet Take 1 tablet (300 mg total) by mouth daily. (Patient taking differently: Take 300 mg by mouth every other day. ) 30 tablet 2  . aspirin EC 81 MG tablet Take 81 mg by mouth daily.    [name] Kitchen co-enzyme Q-10 30 MG capsule Take 30 mg by mouth daily.    [name] Kitchen Cod Liver Oil CAPS Take 1 capsule by mouth at bedtime.    . enalapril-hydrochlorothiazide (VASERETIC) 10-25 MG tablet Take 1 tablet by mouth daily.    [name] Kitchen lovastatin (MEVACOR) 40 MG tablet Take 40 mg by mouth every other day.     . oxyCODONE-acetaminophen (PERCOCET/ROXICET) 5-325 MG tablet Take 1-2 tablets every 4 (four) hours as needed by mouth for moderate pain. 30 tablet 0  . oxyCODONE-acetaminophen (ROXICET) 5-325 MG tablet Take 1 tablet by mouth every 6 (six) hours as needed for up to 2 days. 12 tablet 0  . Vitamin E 100 units TABS Take 100 Units by mouth daily.      No current facility-administered medications on file prior to visit.      Physical Examination  Vitals:   10/24/17 1520 10/24/17 1521  BP: (!) 102/50 (!) 124/51  Pulse:  79   Resp: 18   Temp: 97.6 F (36.4 C)   TempSrc: Oral   SpO2: 97%   Weight: 188 lb 14.4 oz (85.7 kg)    Body mass index is 25.62 kg/m.  Right leg and foot with 2+ non pitting edema secondary to reperfusion.  The patient's midline abdominal, bilateral legs, and left groin indicisions are healing well; right groin incision is draining moderate amount of serous drainage, no signs of infection.   Brisk Doppler signals in bilateral DP and PT arteries.   Medical Decision Making  Dwayne Cuevas is a 66 y.o. year old male who presents s/p aortobifemoral bypass, right iliofemoral endarterectomy, left leg angiogram, left  leg thrombectomy, right common femoral artery to below-the-knee popliteal artery bypass with Propaten, and aborted left tibial exploration on 10-14-17 by Dr. [name] and Dr. Scot Dock.  All staples removed from midline abdominal incision and left leg.    Dr. [name] spoke with and examined pt. Will prescribe Cipro as prophylaxis to infection:  right groin has serous drainage with no signs of infection, and there is Propaten in the right leg bypass graft. Right leg and foot with 2+ non pitting edema secondary to reperfusion.  Pt instructed to elevate his feet above slightly bent knees, feet above heart, overnight and 3-4 times per day for 20 minutes.  The patient's midline abdominal, bilateral legs, and left groin indicisions are healing well.  Cipro 500 mg po bid x 10 days, 0 refills sent electronically to his [employer]. .  Tylenol 1000 mg every 8 hours as needed for pain.   Follow up on 11-21-17 with Dr. [name] as scheduled for post operative follow up.    Thank you for allowing [place] to participate in this patient's care.  Clemon Chambers, RN, MSN, FNP-C [facility] Office: [phone]  10/24/2017, 3:43 PM  Clinic MD: [name]

## 2017-10-29 DIAGNOSIS — I2510 Atherosclerotic heart disease of native coronary artery without angina pectoris: Secondary | ICD-10-CM | POA: Diagnosis not present

## 2017-10-29 DIAGNOSIS — M6281 Muscle weakness (generalized): Secondary | ICD-10-CM | POA: Diagnosis not present

## 2017-10-29 DIAGNOSIS — Z48812 Encounter for surgical aftercare following surgery on the circulatory system: Secondary | ICD-10-CM | POA: Diagnosis not present

## 2017-10-29 DIAGNOSIS — Z87891 Personal history of nicotine dependence: Secondary | ICD-10-CM | POA: Diagnosis not present

## 2017-10-29 DIAGNOSIS — R2689 Other abnormalities of gait and mobility: Secondary | ICD-10-CM | POA: Diagnosis not present

## 2017-10-29 DIAGNOSIS — J449 Chronic obstructive pulmonary disease, unspecified: Secondary | ICD-10-CM | POA: Diagnosis not present

## 2017-10-31 DIAGNOSIS — J449 Chronic obstructive pulmonary disease, unspecified: Secondary | ICD-10-CM | POA: Diagnosis not present

## 2017-10-31 DIAGNOSIS — Z87891 Personal history of nicotine dependence: Secondary | ICD-10-CM | POA: Diagnosis not present

## 2017-10-31 DIAGNOSIS — I2510 Atherosclerotic heart disease of native coronary artery without angina pectoris: Secondary | ICD-10-CM | POA: Diagnosis not present

## 2017-10-31 DIAGNOSIS — R2689 Other abnormalities of gait and mobility: Secondary | ICD-10-CM | POA: Diagnosis not present

## 2017-10-31 DIAGNOSIS — Z48812 Encounter for surgical aftercare following surgery on the circulatory system: Secondary | ICD-10-CM | POA: Diagnosis not present

## 2017-10-31 DIAGNOSIS — M6281 Muscle weakness (generalized): Secondary | ICD-10-CM | POA: Diagnosis not present

## 2017-11-03 DIAGNOSIS — Z87891 Personal history of nicotine dependence: Secondary | ICD-10-CM | POA: Diagnosis not present

## 2017-11-03 DIAGNOSIS — I2510 Atherosclerotic heart disease of native coronary artery without angina pectoris: Secondary | ICD-10-CM | POA: Diagnosis not present

## 2017-11-03 DIAGNOSIS — R2689 Other abnormalities of gait and mobility: Secondary | ICD-10-CM | POA: Diagnosis not present

## 2017-11-03 DIAGNOSIS — M6281 Muscle weakness (generalized): Secondary | ICD-10-CM | POA: Diagnosis not present

## 2017-11-03 DIAGNOSIS — J449 Chronic obstructive pulmonary disease, unspecified: Secondary | ICD-10-CM | POA: Diagnosis not present

## 2017-11-03 DIAGNOSIS — Z48812 Encounter for surgical aftercare following surgery on the circulatory system: Secondary | ICD-10-CM | POA: Diagnosis not present

## 2017-11-07 ENCOUNTER — Telehealth: Payer: Self-pay | Admitting: Surgery

## 2017-11-07 DIAGNOSIS — I2510 Atherosclerotic heart disease of native coronary artery without angina pectoris: Secondary | ICD-10-CM | POA: Diagnosis not present

## 2017-11-07 DIAGNOSIS — J449 Chronic obstructive pulmonary disease, unspecified: Secondary | ICD-10-CM | POA: Diagnosis not present

## 2017-11-07 DIAGNOSIS — Z48812 Encounter for surgical aftercare following surgery on the circulatory system: Secondary | ICD-10-CM | POA: Diagnosis not present

## 2017-11-07 DIAGNOSIS — R2689 Other abnormalities of gait and mobility: Secondary | ICD-10-CM | POA: Diagnosis not present

## 2017-11-07 DIAGNOSIS — Z87891 Personal history of nicotine dependence: Secondary | ICD-10-CM | POA: Diagnosis not present

## 2017-11-07 DIAGNOSIS — M6281 Muscle weakness (generalized): Secondary | ICD-10-CM | POA: Diagnosis not present

## 2017-11-07 NOTE — Telephone Encounter (Signed)
Patient called with c/o SOB, trouble sleeping, pain in groins, and low BP.. I told him he could goto the ER if needed over the weekend, or come to office on Monday.  I also asked him to contact his PCP>  Dwayne Cuevas

## 2017-11-10 DIAGNOSIS — Z87891 Personal history of nicotine dependence: Secondary | ICD-10-CM | POA: Diagnosis not present

## 2017-11-10 DIAGNOSIS — Z48812 Encounter for surgical aftercare following surgery on the circulatory system: Secondary | ICD-10-CM | POA: Diagnosis not present

## 2017-11-10 DIAGNOSIS — I2510 Atherosclerotic heart disease of native coronary artery without angina pectoris: Secondary | ICD-10-CM | POA: Diagnosis not present

## 2017-11-10 DIAGNOSIS — M6281 Muscle weakness (generalized): Secondary | ICD-10-CM | POA: Diagnosis not present

## 2017-11-10 DIAGNOSIS — R2689 Other abnormalities of gait and mobility: Secondary | ICD-10-CM | POA: Diagnosis not present

## 2017-11-10 DIAGNOSIS — J449 Chronic obstructive pulmonary disease, unspecified: Secondary | ICD-10-CM | POA: Diagnosis not present

## 2017-11-12 DIAGNOSIS — Z48812 Encounter for surgical aftercare following surgery on the circulatory system: Secondary | ICD-10-CM | POA: Diagnosis not present

## 2017-11-12 DIAGNOSIS — I2510 Atherosclerotic heart disease of native coronary artery without angina pectoris: Secondary | ICD-10-CM | POA: Diagnosis not present

## 2017-11-12 DIAGNOSIS — Z87891 Personal history of nicotine dependence: Secondary | ICD-10-CM | POA: Diagnosis not present

## 2017-11-12 DIAGNOSIS — R2689 Other abnormalities of gait and mobility: Secondary | ICD-10-CM | POA: Diagnosis not present

## 2017-11-12 DIAGNOSIS — M6281 Muscle weakness (generalized): Secondary | ICD-10-CM | POA: Diagnosis not present

## 2017-11-12 DIAGNOSIS — J449 Chronic obstructive pulmonary disease, unspecified: Secondary | ICD-10-CM | POA: Diagnosis not present

## 2017-11-14 DIAGNOSIS — I2510 Atherosclerotic heart disease of native coronary artery without angina pectoris: Secondary | ICD-10-CM | POA: Diagnosis not present

## 2017-11-14 DIAGNOSIS — R2689 Other abnormalities of gait and mobility: Secondary | ICD-10-CM | POA: Diagnosis not present

## 2017-11-14 DIAGNOSIS — J449 Chronic obstructive pulmonary disease, unspecified: Secondary | ICD-10-CM | POA: Diagnosis not present

## 2017-11-14 DIAGNOSIS — Z87891 Personal history of nicotine dependence: Secondary | ICD-10-CM | POA: Diagnosis not present

## 2017-11-14 DIAGNOSIS — Z48812 Encounter for surgical aftercare following surgery on the circulatory system: Secondary | ICD-10-CM | POA: Diagnosis not present

## 2017-11-14 DIAGNOSIS — M6281 Muscle weakness (generalized): Secondary | ICD-10-CM | POA: Diagnosis not present

## 2017-11-17 DIAGNOSIS — M6281 Muscle weakness (generalized): Secondary | ICD-10-CM | POA: Diagnosis not present

## 2017-11-17 DIAGNOSIS — R2689 Other abnormalities of gait and mobility: Secondary | ICD-10-CM | POA: Diagnosis not present

## 2017-11-17 DIAGNOSIS — I2510 Atherosclerotic heart disease of native coronary artery without angina pectoris: Secondary | ICD-10-CM | POA: Diagnosis not present

## 2017-11-17 DIAGNOSIS — Z87891 Personal history of nicotine dependence: Secondary | ICD-10-CM | POA: Diagnosis not present

## 2017-11-17 DIAGNOSIS — Z48812 Encounter for surgical aftercare following surgery on the circulatory system: Secondary | ICD-10-CM | POA: Diagnosis not present

## 2017-11-17 DIAGNOSIS — J449 Chronic obstructive pulmonary disease, unspecified: Secondary | ICD-10-CM | POA: Diagnosis not present

## 2017-11-20 DIAGNOSIS — Z87891 Personal history of nicotine dependence: Secondary | ICD-10-CM | POA: Diagnosis not present

## 2017-11-20 DIAGNOSIS — I2510 Atherosclerotic heart disease of native coronary artery without angina pectoris: Secondary | ICD-10-CM | POA: Diagnosis not present

## 2017-11-20 DIAGNOSIS — M6281 Muscle weakness (generalized): Secondary | ICD-10-CM | POA: Diagnosis not present

## 2017-11-20 DIAGNOSIS — Z48812 Encounter for surgical aftercare following surgery on the circulatory system: Secondary | ICD-10-CM | POA: Diagnosis not present

## 2017-11-20 DIAGNOSIS — J449 Chronic obstructive pulmonary disease, unspecified: Secondary | ICD-10-CM | POA: Diagnosis not present

## 2017-11-20 DIAGNOSIS — R2689 Other abnormalities of gait and mobility: Secondary | ICD-10-CM | POA: Diagnosis not present

## 2017-11-20 NOTE — Progress Notes (Signed)
    Postoperative Visit   History of Present Illness   Dwayne Cuevas is a 67 y.o. year old male who presents for postoperative follow-up for: ABF, R iliofem EA, R CFA to BK pop with Propaten (10/14/17).  After discharge from the hospital, the patient reported developed some viral syndrome sx with transient low BP.  These sx resolve and his low BP resolved once he held his BP rx.  The patient was seen in the office with drainage from the R groin on 10/24/17.  He was started on Cipro for empiric therapy.   For VQI Use Only   PRE-ADM LIVING: Home  AMB STATUS: Ambulatory   Physical Examination   Vitals:   11/21/17 1324  BP: 129/79  Pulse: 97  Resp: 16  Temp: 98.6 F (37 C)  TempSrc: Oral  SpO2: 93%  Weight: 180 lb 1.6 oz (81.7 kg)  Height: 6' (1.829 m)    Abd:  Healed incision, NABS, no TTP, -G/R  R groin: incision healed, small area of ballotable fluid, palpable pulse in R groin, on Sonosite: small superficial fluid collection, deeper fluid collection possibly adjacent to common femoral artery/aortobifemoral limb  L groin: incision healed  Vasc: B pedal pulses faintly palpable   Medical Decision Making   Dwayne Cuevas is a 67 y.o. year old male who presents s/p ABF, R iliofem EA, R CFA to BK pop with Propaten.   The patient's bypass incisions are healing appropriately with resolution of pre-operative symptoms.  Pt describes a transient period concerning for possible bacteremia.  Will obtain: CBC to look at WBC, Blood cultures x 2, CT abd/pelvis w/ and w/o contrast to evaluate the graft.  The patient will follow up in 2 weeks.   Adele Barthel, MD, FACS [facility] Office: [phone] Pager: [phone]

## 2017-11-21 ENCOUNTER — Ambulatory Visit (INDEPENDENT_AMBULATORY_CARE_PROVIDER_SITE_OTHER): Payer: Medicare Other | Admitting: Vascular Surgery

## 2017-11-21 ENCOUNTER — Encounter: Payer: Self-pay | Admitting: Vascular Surgery

## 2017-11-21 VITALS — BP 129/79 | HR 97 | Temp 98.6°F | Resp 16 | Ht 72.0 in | Wt 180.1 lb

## 2017-11-21 DIAGNOSIS — I7409 Other arterial embolism and thrombosis of abdominal aorta: Secondary | ICD-10-CM

## 2017-11-21 NOTE — Addendum Note (Signed)
Addended by: Lianne Cure A on: 11/21/2017 02:59 PM   Modules accepted: Orders

## 2017-11-26 DIAGNOSIS — M6281 Muscle weakness (generalized): Secondary | ICD-10-CM | POA: Diagnosis not present

## 2017-11-26 DIAGNOSIS — R2689 Other abnormalities of gait and mobility: Secondary | ICD-10-CM | POA: Diagnosis not present

## 2017-11-26 DIAGNOSIS — Z48812 Encounter for surgical aftercare following surgery on the circulatory system: Secondary | ICD-10-CM | POA: Diagnosis not present

## 2017-11-26 DIAGNOSIS — Z87891 Personal history of nicotine dependence: Secondary | ICD-10-CM | POA: Diagnosis not present

## 2017-11-26 DIAGNOSIS — I2510 Atherosclerotic heart disease of native coronary artery without angina pectoris: Secondary | ICD-10-CM | POA: Diagnosis not present

## 2017-11-26 DIAGNOSIS — J449 Chronic obstructive pulmonary disease, unspecified: Secondary | ICD-10-CM | POA: Diagnosis not present

## 2017-11-27 DIAGNOSIS — M6281 Muscle weakness (generalized): Secondary | ICD-10-CM | POA: Diagnosis not present

## 2017-11-27 DIAGNOSIS — Z48812 Encounter for surgical aftercare following surgery on the circulatory system: Secondary | ICD-10-CM | POA: Diagnosis not present

## 2017-11-27 DIAGNOSIS — R2689 Other abnormalities of gait and mobility: Secondary | ICD-10-CM | POA: Diagnosis not present

## 2017-11-27 DIAGNOSIS — I2510 Atherosclerotic heart disease of native coronary artery without angina pectoris: Secondary | ICD-10-CM | POA: Diagnosis not present

## 2017-11-27 DIAGNOSIS — J449 Chronic obstructive pulmonary disease, unspecified: Secondary | ICD-10-CM | POA: Diagnosis not present

## 2017-11-27 DIAGNOSIS — Z87891 Personal history of nicotine dependence: Secondary | ICD-10-CM | POA: Diagnosis not present

## 2017-11-28 DIAGNOSIS — I7409 Other arterial embolism and thrombosis of abdominal aorta: Secondary | ICD-10-CM | POA: Diagnosis not present

## 2017-12-02 ENCOUNTER — Ambulatory Visit (HOSPITAL_COMMUNITY)
Admission: RE | Admit: 2017-12-02 | Discharge: 2017-12-02 | Disposition: A | Discharge status: Home / Self Care | Payer: Medicare Other | Source: Ambulatory Visit | Attending: Vascular Surgery | Admitting: Vascular Surgery

## 2017-12-02 DIAGNOSIS — I7409 Other arterial embolism and thrombosis of abdominal aorta: Secondary | ICD-10-CM | POA: Insufficient documentation

## 2017-12-02 DIAGNOSIS — Q278 Other specified congenital malformations of peripheral vascular system: Secondary | ICD-10-CM | POA: Diagnosis not present

## 2017-12-02 DIAGNOSIS — Z95828 Presence of other vascular implants and grafts: Secondary | ICD-10-CM | POA: Insufficient documentation

## 2017-12-02 DIAGNOSIS — I70202 Unspecified atherosclerosis of native arteries of extremities, left leg: Secondary | ICD-10-CM | POA: Diagnosis not present

## 2017-12-02 DIAGNOSIS — R935 Abnormal findings on diagnostic imaging of other abdominal regions, including retroperitoneum: Secondary | ICD-10-CM | POA: Diagnosis not present

## 2017-12-02 MED ORDER — IOPAMIDOL (ISOVUE-370) INJECTION 76%
100.0000 mL | Freq: Once | INTRAVENOUS | Status: AC | PRN
  Administered 2017-12-02: 100 mL via INTRAVENOUS

## 2017-12-04 NOTE — Progress Notes (Signed)
    Postoperative Visit   History of Present Illness   Dwayne Cuevas is a 67 y.o. year old male who presents for postoperative follow-up for: ABF, R iliofem EA, R CFA to BK pop with Propaten (10/14/17).  After discharge from the hospital, the patient reported developed some viral syndrome sx with transient low BP.  These sx resolve and his low BP resolved once he held his BP rx.  The patient was seen in the office with drainage from the R groin on 10/24/17.  He was started on Cipro for empiric therapy.  Pt returns today for interval CTA to evaluate the ABF for infectious changes   For VQI Use Only   PRE-ADM LIVING: Home  AMB STATUS: Ambulatory   Physical Examination   Vitals:   12/05/17 0957  BP: 129/67  Pulse: 77  Resp: 18  SpO2: 97%  Weight: 182 lb (82.6 kg)  Height: 6' (1.829 m)    Abd:  Healed incision, NABS, no TTP, -G/R  R groin: incision healed, less ballotable fluid collection, prominently palpable pulse in R groin,   L groin: incision healed, palpable pulse  Vasc: B pedal pulses faintly palpable  CTA abd/pelvis (12/02/17): 1. Approximately 5.7 cm bilobed serpiginous fluid collection about the about the right groin operative site, indeterminate though favored to represent a postoperative seroma.   Medical Decision Making   Dwayne Cuevas is a 67 y.o. year old male who presents s/p ABF, R iliofem EA, R CFA to BK pop with Propaten, R groin seroma   The CTA does not demonstrate a PSA, rather the two graft on top the right common femoral artery result in the prominent pulse.  The fluid in the right groin appears better than previous without any obvious stranding around the ABF.  The patient's bypass incisions are healing appropriately with resolution of pre-operative symptoms.  Will do a R groin duplex in 3 months to watch the seroma.  I will see him at that point.  Aortoiliac duplex and BLE ABI in one year.  Adele Barthel, MD, FACS [facility] [place] Office: [phone] Pager: [phone]

## 2017-12-05 ENCOUNTER — Encounter: Payer: Self-pay | Admitting: Vascular Surgery

## 2017-12-05 ENCOUNTER — Other Ambulatory Visit: Payer: Self-pay

## 2017-12-05 ENCOUNTER — Ambulatory Visit (INDEPENDENT_AMBULATORY_CARE_PROVIDER_SITE_OTHER): Payer: Self-pay | Admitting: Vascular Surgery

## 2017-12-05 VITALS — BP 129/67 | HR 77 | Resp 18 | Ht 72.0 in | Wt 182.0 lb

## 2017-12-05 DIAGNOSIS — I7409 Other arterial embolism and thrombosis of abdominal aorta: Secondary | ICD-10-CM

## 2017-12-05 DIAGNOSIS — I70229 Atherosclerosis of native arteries of extremities with rest pain, unspecified extremity: Secondary | ICD-10-CM

## 2017-12-05 DIAGNOSIS — I998 Other disorder of circulatory system: Secondary | ICD-10-CM

## 2017-12-08 ENCOUNTER — Encounter: Payer: Self-pay | Admitting: Internal Medicine

## 2017-12-08 ENCOUNTER — Ambulatory Visit (INDEPENDENT_AMBULATORY_CARE_PROVIDER_SITE_OTHER): Payer: Medicare Other | Admitting: Internal Medicine

## 2017-12-08 VITALS — BP 138/60 | HR 76 | Ht 72.0 in | Wt 183.8 lb

## 2017-12-08 DIAGNOSIS — I2510 Atherosclerotic heart disease of native coronary artery without angina pectoris: Secondary | ICD-10-CM | POA: Diagnosis not present

## 2017-12-08 DIAGNOSIS — I739 Peripheral vascular disease, unspecified: Secondary | ICD-10-CM

## 2017-12-08 DIAGNOSIS — J449 Chronic obstructive pulmonary disease, unspecified: Secondary | ICD-10-CM | POA: Diagnosis not present

## 2017-12-08 DIAGNOSIS — E785 Hyperlipidemia, unspecified: Secondary | ICD-10-CM | POA: Diagnosis not present

## 2017-12-08 LAB — LIPID PANEL
Chol/HDL Ratio: 8 ratio — ABNORMAL HIGH (ref 0.0–5.0)
Cholesterol, Total: 209 mg/dL — ABNORMAL HIGH (ref 100–199)
HDL: 26 mg/dL — ABNORMAL LOW (ref >39)
LDL Calculated: 121 mg/dL — ABNORMAL HIGH (ref 0–99)
Triglycerides: 309 mg/dL — ABNORMAL HIGH (ref 0–149)
VLDL Cholesterol Cal: 62 mg/dL — ABNORMAL HIGH (ref 5–40)

## 2017-12-08 NOTE — Progress Notes (Signed)
12/08/2017 Dwayne Cuevas   07-06-1951  [id number]  Primary Physician Sinda Du, MD Primary Cardiologist: Dr. Harrington Challenger  Reason for Visit/CC: Pre-operative Clearance   HPI: Patient si a 67 yo with a history of PVOD, COPD, PE and coronary calcifications  He had a GXT Myovue in 2017  There were not diagnostic ST segment changes during exercise to indicate ischemia. Nuclear images showed small, mild intensity, inferoseptal defect extending from apex to base, overall fixed. There is a minor degree of partial reversibility towards the apex. Suspect variable soft tissue attenuation versus a minor region of apical inferoseptal ischemia. EF was 70. Plan for medical Rx . 2D echo on 06/11/17 showed normal LVEF at 60-65% with norma l wall motion without regional abnormalities. There was mild AI. The aortic root and ascending aorta were normal in size. He was last seen in cardiology as part of preop eval for LE vascular surgery   SInce seen the pt says his breathing is pretty good for him  Does get short winded with actvitiy  No change  He denies CP   No dizziness now but he Had to pull back on bp meds around Christmas time   BP was low   Current Meds  Medication Sig  . allopurinol (ZYLOPRIM) 300 MG tablet Take 300 mg by mouth every other day.  [name] Kitchen aspirin EC 81 MG tablet Take 81 mg by mouth daily.  [name] Kitchen co-enzyme Q-10 30 MG capsule Take 30 mg by mouth daily.  [name] Kitchen Cod Liver Oil CAPS Take 1 capsule by mouth at bedtime.  . lovastatin (MEVACOR) 40 MG tablet Take 40 mg by mouth every other day.  . Vitamin E 100 units TABS Take 100 Units by mouth daily.    Allergies  Allergen Reactions  . Bee Venom     Swelling, shortness of breathe  . Shellfish Allergy Other (See Comments)    Gout, not allergy, patient doesn't eat because it causes gout  . Penicillins Rash and Other (See Comments)    Has patient had a PCN reaction causing immediate rash, facial/tongue/throat swelling, SOB or lightheadedness with  hypotension: Yes Has patient had a PCN reaction causing severe rash involving mucus membranes or skin necrosis: No Has patient had a PCN reaction that required hospitalization No Has patient had a PCN reaction occurring within the last 10 years: No If all of the above answers are "NO", then may proceed with Cephalosporin use.    Past Medical History:  Diagnosis Date  . Cancer ([place])    skin cancer removed from leg and head  . COPD (chronic obstructive pulmonary disease) ([place])   . Coronary artery disease    "a couple blockages on each side of my heart" pt reports this but has not sure if he has had a cath  . DVT (deep venous thrombosis) ([place]) 02/05/2016  . Gout   . Hypercholesteremia   . Hypertension   . PE (pulmonary embolism) 02/05/2016  . Scoliosis    Family History  Problem Relation Age of Onset  . Heart disease Mother   . Heart disease Father   . Cystic fibrosis Paternal Aunt    Past Surgical History:  Procedure Laterality Date  . ABDOMINAL AORTOGRAM W/LOWER EXTREMITY N/A 09/04/2017   Procedure: ABDOMINAL AORTOGRAM W/LOWER EXTREMITY;  Surgeon: Conrad [place]burg, MD;  Location: [place] [facility];  Service: Cardiovascular;  Laterality: N/A;  . AORTA - BILATERAL FEMORAL ARTERY BYPASS GRAFT Bilateral 10/14/2017   Procedure: AORTA BIFEMORAL BYPASS GRAFT;  Surgeon: Conrad Camuy, MD;  Location: [place];  Service: Vascular;  Laterality: Bilateral;  . FEMORAL-POPLITEAL BYPASS GRAFT Right 10/14/2017   Procedure: Right FEMORAL-POPLITEAL ARTERY Bypass graft using gortex graft;  Surgeon: Conrad Damascus, MD;  Location: [facility];  Service: Vascular;  Laterality: Right;  . FOOT SURGERY Left    "put my foot back together"   . INTRAOPERATIVE ARTERIOGRAM Left 10/14/2017   Procedure: INTRA OPERATIVE ARTERIOGRAM;  Surgeon: Conrad [place] Dale, MD;  Location: [place];  Service: Vascular;  Laterality: Left;  . NECK SURGERY     discs removed, steel plate  . PERIPHERAL VASCULAR INTERVENTION Left 09/04/2017    Procedure: PERIPHERAL VASCULAR INTERVENTION;  Surgeon: Conrad [place]der, MD;  Location: [place] [facility];  Service: Cardiovascular;  Laterality: Left;  left common iliac  . SKIN CANCER EXCISION    . THROMBECTOMY FEMORAL ARTERY Left 10/14/2017   Procedure: THROMBECTOMY Left Lower leg;  Surgeon: Conrad [place], MD;  Location: [facility] OR;  Service: Vascular;  Laterality: Left;   Social History   Socioeconomic History  . Marital status: Divorced    Spouse name: Not on file  . Number of children: Not on file  . Years of education: Not on file  . Highest education level: Not on file  Social Needs  . Financial resource strain: Not on file  . Food insecurity - worry: Not on file  . Food insecurity - inability: Not on file  . Transportation needs - medical: Not on file  . Transportation needs - non-medical: Not on file  Occupational History  . Not on file  Tobacco Use  . Smoking status: Former Smoker    Types: Cigarettes    Last attempt to quit: 08/31/2017    Years since quitting: 0.2  . Smokeless tobacco: Never Used  Substance and Sexual Activity  . Alcohol use: No    Frequency: Never  . Drug use: No  . Sexual activity: Not on file  Other Topics Concern  . Not on file  Social History Narrative  . Not on file     Review of Systems: General: negative for chills, fever, night sweats or weight changes.  Cardiovascular: negative for chest pain, dyspnea on exertion, edema, orthopnea, palpitations, paroxysmal nocturnal dyspnea or shortness of breath Dermatological: negative for rash Respiratory: negative for cough or wheezing Urologic: negative for hematuria Abdominal: negative for nausea, vomiting, diarrhea, bright red blood per rectum, melena, or hematemesis Neurologic: negative for visual changes, syncope, or dizziness All other systems reviewed and are otherwise negative except as noted above.   Physical Exam:  Blood pressure 138/60, pulse 76, height 6' (1.829 m), weight 183 lb 12.8  oz (83.4 kg).  General appearance: alert, cooperative and no distress Neck: no carotid bruit and no JVD Lungs: clear to auscultation bilaterally Heart: regular rate and rhythm, S1, S2 normal, no murmur, click, rub or gallop Extremities: no LEE Pulses: decreased DPs bilaterally Skin: Skin color, texture, turgor normal. No rashes or lesions Neurologic: Grossly normal  LE with trace edema (R greater than L)  EKG No EKG   ASSESSMENT AND PLAN:   1  CAD   Pt with CAD on CT scan  Low risk myovue   I do not see any signs for active ischemia  Follow  2   PAD  REcovering from recent surgery  FOllowed by Dr [name]  3  HL  Will check lipids   4  COPD  Appears stable  F/u  In October  Dorris Carnes [place], [place] [facility] 12/08/2017 9:48 AM

## 2017-12-08 NOTE — Patient Instructions (Signed)
Medication Instructions:  Your physician recommends that you continue on your current medications as directed. Please refer to the Current Medication list given to you today.  Labwork: Lipid today  Testing/Procedures: None  Follow-Up: Your physician wants you to follow-up in: October 2019 with Dr. Harrington Challenger.  You will receive a reminder letter in the mail two months in advance. If you don't receive a letter, please call our office to schedule the follow-up appointment.   Any Other Special Instructions Will Be Listed Below (If Applicable).     If you need a refill on your cardiac medications before your next appointment, please call your pharmacy.

## 2017-12-11 ENCOUNTER — Telehealth: Payer: Self-pay | Admitting: Internal Medicine

## 2017-12-11 DIAGNOSIS — E785 Hyperlipidemia, unspecified: Secondary | ICD-10-CM

## 2017-12-11 MED ORDER — ROSUVASTATIN CALCIUM 5 MG PO TABS: 5.0000 mg | ORAL_TABLET | Freq: Every day | ORAL | 3 refills | Status: DC

## 2017-12-11 NOTE — Telephone Encounter (Signed)
Spoke with pt and went over lab results per Dr. Harrington Challenger.  Scheduled f/u labs for 3/26.  Pt verbalized understanding and was in agreement with this plan.

## 2017-12-11 NOTE — Telephone Encounter (Signed)
Follow Up:      Returning your call from this morning, concerning his lab results. 

## 2017-12-15 NOTE — Addendum Note (Signed)
Addended by: Lianne Cure A on: 12/15/2017 02:29 PM   Modules accepted: Orders

## 2017-12-24 IMAGING — CT CT ABD-PELV W/ CM
1 of 2 series · 13 of 32 positions shown, 17 images · IV contrast (iopamidol)
Comparison: CT chest of 02/05/2016

CLINICAL DATA: Diffuse abdominal pain for 1 day, smoking history

EXAM:
CT ABDOMEN AND PELVIS WITH CONTRAST
TECHNIQUE: Multidetector CT imaging of the abdomen and pelvis was performed
using the standard protocol following bolus administration of
intravenous contrast.
CONTRAST:  100mL [id number] IOPAMIDOL ([id number]) INJECTION 61%

[Series 2: routine abd pel with · axial · 0.71mm/px · z∈[+548,+982]mm · 13 of 97 slices shown, 17 images]
[im 5/97  soft-tissue]
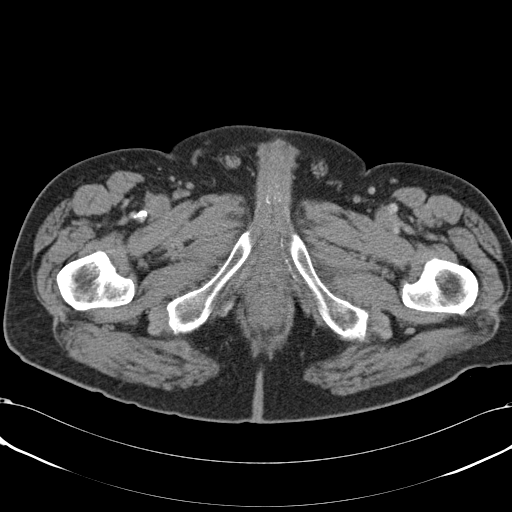
[im 5/97  bone]
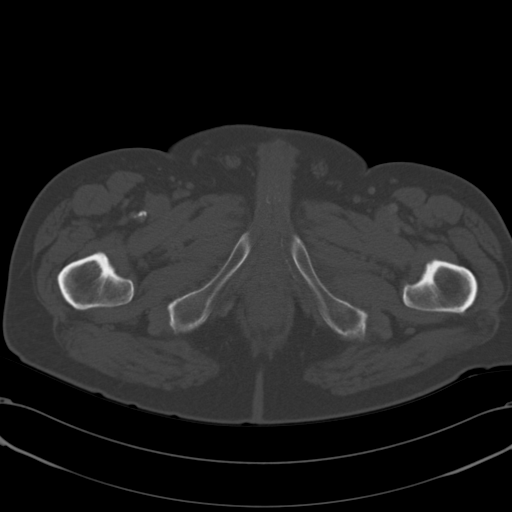
[im 14/97  soft-tissue]
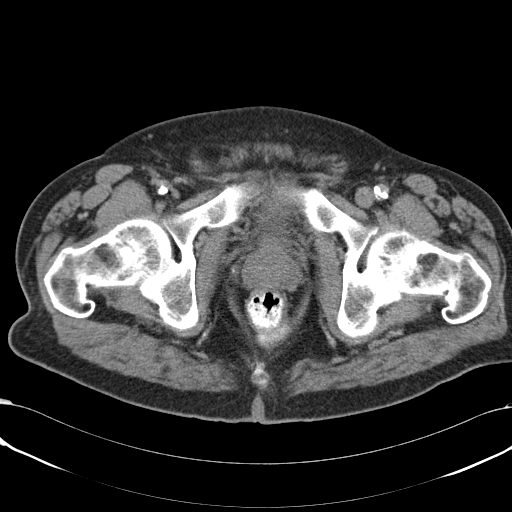
[im 23/97  soft-tissue]
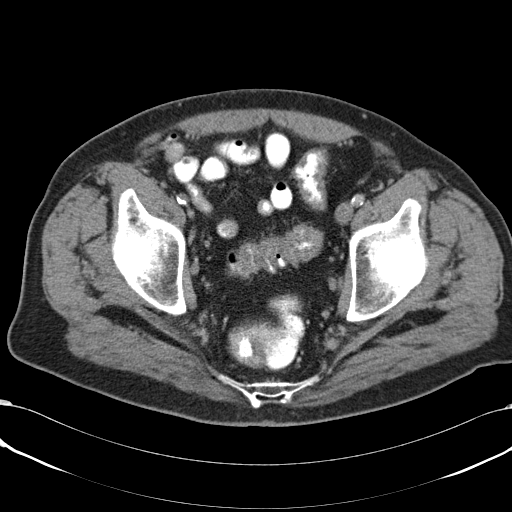
[im 33/97  soft-tissue]
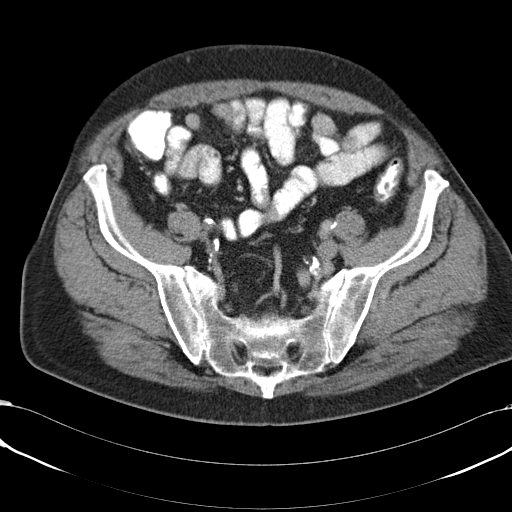
[im 42/97  soft-tissue]
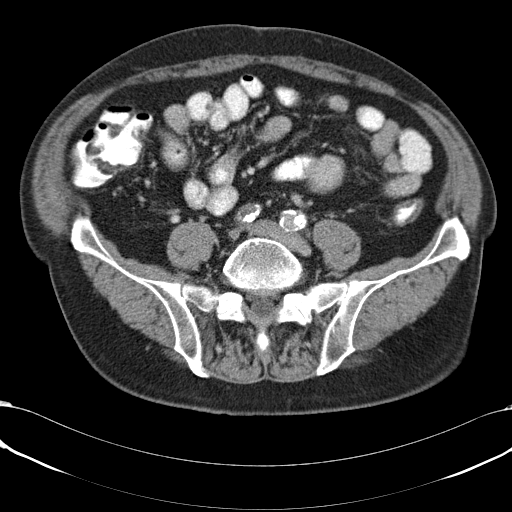
[im 51/97  soft-tissue]
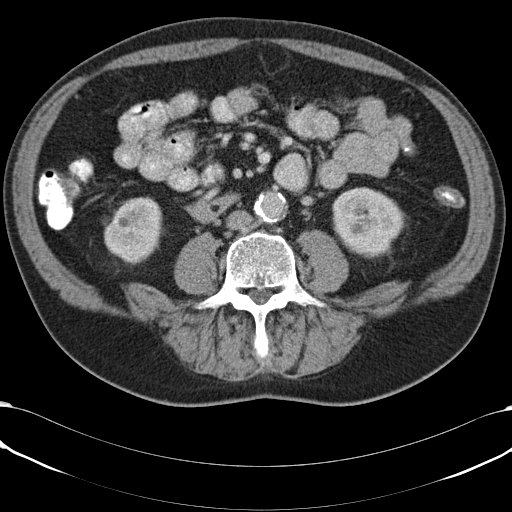
[im 55/97  soft-tissue]
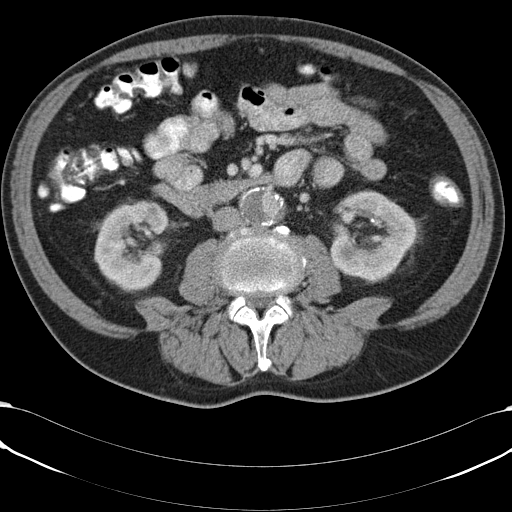
[im 65/97  soft-tissue]
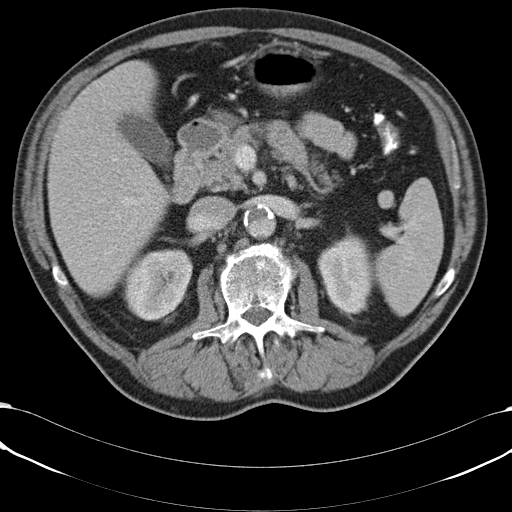
[im 74/97  soft-tissue]
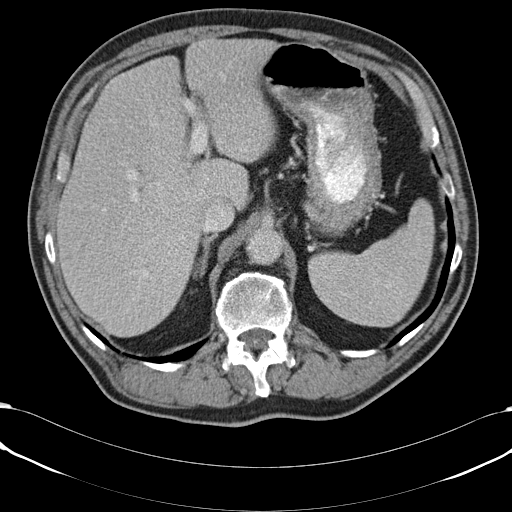
[im 74/97  bone]
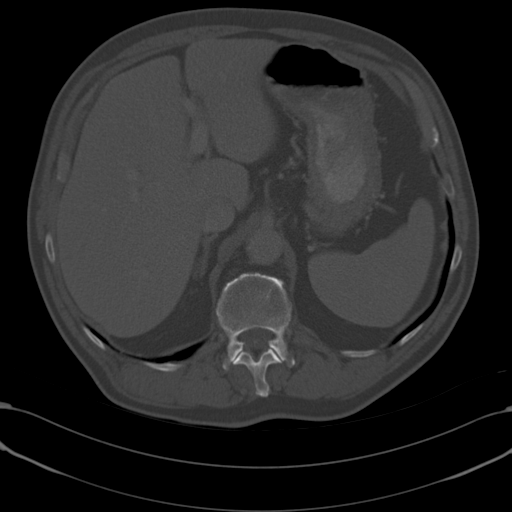
[im 78/97  lung]
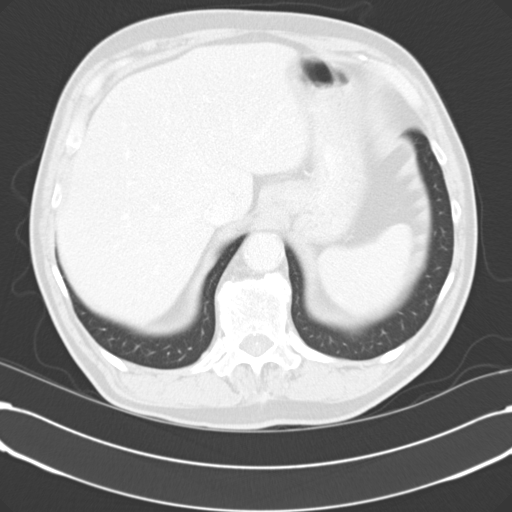
[im 83/97  soft-tissue]
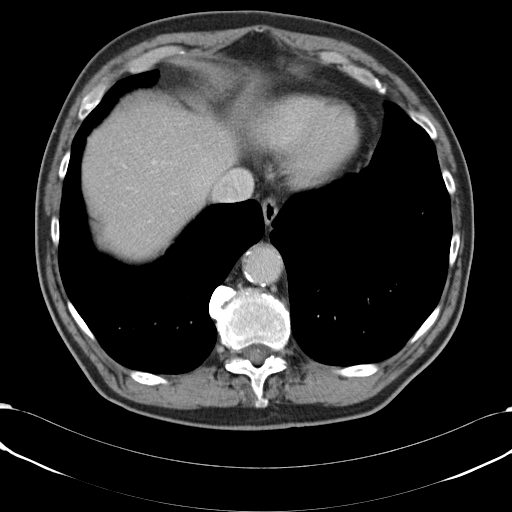
[im 83/97  lung]
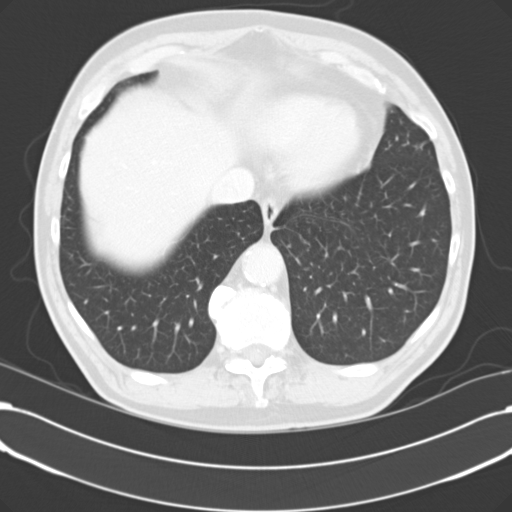
[im 87/97  lung]
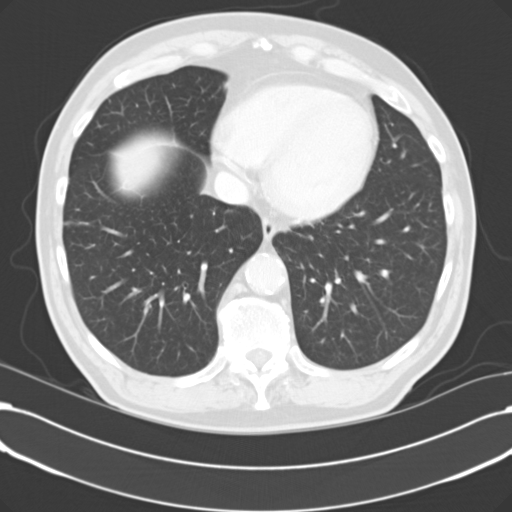
[im 92/97  soft-tissue]
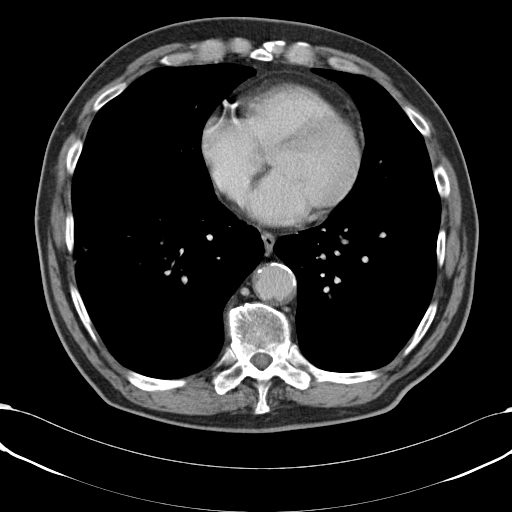
[im 92/97  lung]
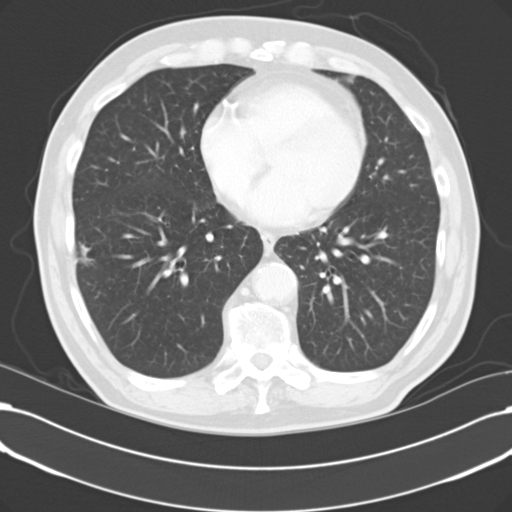

[13 of 32 positions shown; findings below may reference images not displayed]

FINDINGS: The lung bases are clear. Coronary artery calcifications are present
in the distribution of the left anterior descending and right
coronary arteries. The liver enhances and there is a probable cyst
in the right lobe near the dome posteriorly of 11 mm in diameter.
However in the mid right lobe on image 22 there is a indeterminate
lesion in the mid right lobe of 12 mm in diameter. This may
represent hemangioma, becoming relatively isodense on delayed
images. Ultrasound may be helpful to assess further if warranted
clinically. No calcified gallstones are seen. The pancreas is normal
in size and the pancreatic duct is not dilated. The adrenal glands
and spleen are unremarkable. The stomach is relatively decompressed.
Small renal calcifications are present bilaterally which may
represent small nonobstructing renal calculi or possibly
renovascular calcifications. Also tiny low-attenuation structures
bilaterally most consistent with cysts, but difficult to assess due
to size. On delayed images, the pelvocaliceal systems are
unremarkable and the proximal ureters are normal in caliber. Severe
aortic atherosclerosis is noted. Aneurysmal dilatation of the
infrarenal abdominal aorta is noted measuring 3.3 cm in maximum
diameter with significant plaque formation. Recommend followup by
ultrasound in 3 years. This recommendation follows ACR consensus
guidelines: White Paper of the ACR Incidental Findings Committee II
on Vascular Findings. [HOSPITAL] 3822; [DATE].

There is very poor contrast opacification of the right common iliac
artery which may be occluded. Considerable atherosclerotic change of
the iliac arteries is noted bilaterally. The urinary bladder is not
well distended but no significant abnormality seen other than
slightly thickened wall which may be due to lack of distension. The
prostate measures 2.9 x 4.1 cm. There is thickening of the mucosa of
the rectosigmoid colon which may be due to diverticulosis. Edema
cannot be excluded. No diverticulitis is evident. The more proximal
colon is decompressed. The appendix and terminal ileum are
unremarkable. The lumbar vertebrae are in normal alignment. There is
vacuum disc phenomenon and degenerative change at L1-2 and L2-3
levels.
IMPRESSION: 1. Infrarenal abdominal aortic aneurysm of 3.3 cm with considerable
plaque formation. Diffuse changes of aortic atherosclerosis.
Recommend followup by ultrasound in 3 years. This recommendation
follows ACR consensus guidelines: White Paper of the ACR Incidental
Findings Committee II on Vascular Findings. [HOSPITAL] 3822;
2. Poor opacification of the right common iliac artery which is
heavily calcified as well. Cannot exclude arterial occlusion.
Correlate clinically.
3. Diffuse coronary artery calcifications.
4. Question small bilateral nonobstructing renal calculi or possibly
renovascular calcifications.
5. Thickened mucosa of the rectosigmoid colon may be due to
diverticulosis but mild mucosal edema cannot be excluded.
6. The appendix and terminal ileum are unremarkable.

## 2018-02-10 ENCOUNTER — Other Ambulatory Visit: Payer: Medicare Other | Admitting: *Deleted

## 2018-02-10 DIAGNOSIS — E785 Hyperlipidemia, unspecified: Secondary | ICD-10-CM | POA: Diagnosis not present

## 2018-02-10 LAB — AST: AST: 10 IU/L (ref 0–40)

## 2018-02-10 LAB — LIPID PANEL
Chol/HDL Ratio: 5.4 ratio — ABNORMAL HIGH (ref 0.0–5.0)
Cholesterol, Total: 152 mg/dL (ref 100–199)
HDL: 28 mg/dL — ABNORMAL LOW (ref >39)
LDL Calculated: 92 mg/dL (ref 0–99)
Triglycerides: 161 mg/dL — ABNORMAL HIGH (ref 0–149)
VLDL Cholesterol Cal: 32 mg/dL (ref 5–40)

## 2018-02-13 ENCOUNTER — Telehealth: Payer: Self-pay | Admitting: *Deleted

## 2018-02-13 DIAGNOSIS — E785 Hyperlipidemia, unspecified: Secondary | ICD-10-CM

## 2018-02-13 MED ORDER — ROSUVASTATIN CALCIUM 10 MG PO TABS: 10.0000 mg | ORAL_TABLET | Freq: Every day | ORAL | 3 refills | Status: DC

## 2018-02-13 NOTE — Telephone Encounter (Signed)
Pt is aware of results. Will increase Crestor to 10 mg daily and come for repeat lipids on 05/15/18.

## 2018-02-13 NOTE — Telephone Encounter (Signed)
-----   Message from Dorris Carnes V, MD sent at 02/12/2018  1:52 PM EDT ----- LDL is better but with atherosclerosis known goal would be closer to 70   I would recomm increasing Crestor to 10 mg   F?U lipids in 3 months

## 2018-03-09 NOTE — Progress Notes (Signed)
Established Open AAA Repair   History of Present Illness   Dwayne Cuevas is a 67 y.o. (03-13-1951) male who presents for routine follow up s/p ABF, R iliofem EA, R CFA to BK pop with Propaten (10/14/17).  Patient's post-operative course was complicated by some R groin drainage which was managed by abx.  The drainage stopped but there was some residual fluid in R groin felt to be a seroma.  The patient has had no fever or chills.  The swelling in R groin completely resolved.  Pt has been increasing his activity.  The patient's PMH, PSH, SH, and FamHx were reviewed on  03/11/18 are unchanged from 12/05/17.  Current Outpatient Medications  Medication Sig Dispense Refill  . allopurinol (ZYLOPRIM) 300 MG tablet Take 300 mg by mouth every other day.    [name] Kitchen aspirin EC 81 MG tablet Take 81 mg by mouth daily.    [name] Kitchen co-enzyme Q-10 30 MG capsule Take 30 mg by mouth daily.    [name] Kitchen Cod Liver Oil CAPS Take 1 capsule by mouth at bedtime.    . rosuvastatin (CRESTOR) 10 MG tablet Take 1 tablet (10 mg total) by mouth daily. 90 tablet 3  . Vitamin E 100 units TABS Take 100 Units by mouth daily.      No current facility-administered medications for this visit.     Allergies  Allergen Reactions  . Bee Venom     Swelling, shortness of breathe  . Shellfish Allergy Other (See Comments)    Gout, not allergy, patient doesn't eat because it causes gout  . Penicillins Rash and Other (See Comments)    Has patient had a PCN reaction causing immediate rash, facial/tongue/throat swelling, SOB or lightheadedness with hypotension: Yes Has patient had a PCN reaction causing severe rash involving mucus membranes or skin necrosis: No Has patient had a PCN reaction that required hospitalization No Has patient had a PCN reaction occurring within the last 10 years: No If all of the above answers are "NO", then may proceed with Cephalosporin use.     On ROS today: no rest pain, R groin swelling resolved.   Physical  Examination   Vitals:   03/11/18 1044  BP: 130/73  Pulse: 62  Resp: 20  Temp: 98.1 F (36.7 C)  TempSrc: Oral  SpO2: 98%  Weight: 178 lb (80.7 kg)  Height: 6' (1.829 m)   Body mass index is 24.14 kg/m.  General Alert, O x 3, WD, NAD  Pulmonary Sym exp, good B air movt, CTA B  Cardiac RRR, Nl S1, S2, no Murmurs, No rubs, No S3,S4  Vascular Vessel Right Left  Radial Palpable Palpable  Brachial Palpable Palpable  Carotid Palpable, No Bruit Palpable, No Bruit  Aorta Not palpable N/A  Femoral Palpable Palpable  Popliteal Not palpable Not palpable  PT Palpable Palpable  DP Palpable Palpable    Gastro- intestinal soft, non-distended, non-tender to palpation, No guarding or rebound, no HSM, no masses, no CVAT B, No palpable prominent aortic pulse,    Musculo- skeletal M/S 5/5 throughout  , Extremities without ischemic changes  , No edema present, No visible varicosities , No Lipodermatosclerosis present, midline incision healed with hernia, B groins well healed  Neurologic Pain and light touch intact in extremities , Motor exam as listed above    Non-Invasive Vascular Imaging   R groin duplex (03/11/2018)  No PSA or fluid in R groin  Fem-pop and ABF graft patent   Medical Decision Making  Dwayne Cuevas is a 67 y.o. male who presents s/p ABF, R iliofemoral EA, R CFA to BK pop BPG with Propaten, known R groin seroma.     I discussed with the patient the importance of surveillance of the aortic graft and bypass graft  Will schedule BLE ABI, RLE arterial duplex and aortoiliac duplex in 6 months. The patient is currently on a statin: Crestor.  The patient is currently on an anti-platelet: ASA.  Thank you for allowing [place] to participate in this patient's care.   Adele Barthel, MD, FACS [facility] Office: [phone] Pager: [phone]

## 2018-03-11 ENCOUNTER — Ambulatory Visit (HOSPITAL_COMMUNITY)
Admission: RE | Admit: 2018-03-11 | Discharge: 2018-03-11 | Disposition: A | Discharge status: Home / Self Care | Payer: Medicare Other | Source: Ambulatory Visit | Attending: Vascular Surgery | Admitting: Vascular Surgery

## 2018-03-11 ENCOUNTER — Other Ambulatory Visit: Payer: Self-pay

## 2018-03-11 ENCOUNTER — Ambulatory Visit (INDEPENDENT_AMBULATORY_CARE_PROVIDER_SITE_OTHER): Payer: Medicare Other | Admitting: Vascular Surgery

## 2018-03-11 ENCOUNTER — Encounter: Payer: Self-pay | Admitting: Vascular Surgery

## 2018-03-11 VITALS — BP 130/73 | HR 62 | Temp 98.1°F | Resp 20 | Ht 72.0 in | Wt 178.0 lb

## 2018-03-11 DIAGNOSIS — I998 Other disorder of circulatory system: Secondary | ICD-10-CM

## 2018-03-11 DIAGNOSIS — I70229 Atherosclerosis of native arteries of extremities with rest pain, unspecified extremity: Secondary | ICD-10-CM

## 2018-03-11 DIAGNOSIS — I7409 Other arterial embolism and thrombosis of abdominal aorta: Secondary | ICD-10-CM

## 2018-03-11 DIAGNOSIS — Z95828 Presence of other vascular implants and grafts: Secondary | ICD-10-CM | POA: Insufficient documentation

## 2018-03-11 DIAGNOSIS — I2510 Atherosclerotic heart disease of native coronary artery without angina pectoris: Secondary | ICD-10-CM | POA: Diagnosis not present

## 2018-03-11 DIAGNOSIS — R2241 Localized swelling, mass and lump, right lower limb: Secondary | ICD-10-CM | POA: Diagnosis not present

## 2018-04-30 ENCOUNTER — Other Ambulatory Visit: Payer: Self-pay

## 2018-04-30 DIAGNOSIS — I739 Peripheral vascular disease, unspecified: Secondary | ICD-10-CM

## 2018-04-30 DIAGNOSIS — I7409 Other arterial embolism and thrombosis of abdominal aorta: Secondary | ICD-10-CM

## 2018-05-15 ENCOUNTER — Encounter (INDEPENDENT_AMBULATORY_CARE_PROVIDER_SITE_OTHER): Payer: Self-pay

## 2018-05-15 ENCOUNTER — Other Ambulatory Visit: Payer: Medicare Other | Admitting: *Deleted

## 2018-05-15 DIAGNOSIS — E785 Hyperlipidemia, unspecified: Secondary | ICD-10-CM | POA: Diagnosis not present

## 2018-05-15 LAB — LIPID PANEL
Chol/HDL Ratio: 6 ratio — ABNORMAL HIGH (ref 0.0–5.0)
Cholesterol, Total: 186 mg/dL (ref 100–199)
HDL: 31 mg/dL — ABNORMAL LOW (ref >39)
LDL Calculated: 120 mg/dL — ABNORMAL HIGH (ref 0–99)
Triglycerides: 174 mg/dL — ABNORMAL HIGH (ref 0–149)
VLDL Cholesterol Cal: 35 mg/dL (ref 5–40)

## 2018-05-20 ENCOUNTER — Other Ambulatory Visit: Payer: Self-pay | Admitting: *Deleted

## 2018-05-20 DIAGNOSIS — E785 Hyperlipidemia, unspecified: Secondary | ICD-10-CM

## 2018-05-20 MED ORDER — ROSUVASTATIN CALCIUM 40 MG PO TABS: 40.0000 mg | ORAL_TABLET | Freq: Every day | ORAL | 2 refills | Status: DC

## 2018-06-19 ENCOUNTER — Other Ambulatory Visit: Payer: Self-pay

## 2018-07-15 ENCOUNTER — Other Ambulatory Visit: Payer: Medicare Other | Admitting: *Deleted

## 2018-07-15 DIAGNOSIS — E785 Hyperlipidemia, unspecified: Secondary | ICD-10-CM | POA: Diagnosis not present

## 2018-07-15 LAB — LIPID PANEL
Chol/HDL Ratio: 3.6 ratio (ref 0.0–5.0)
Cholesterol, Total: 119 mg/dL (ref 100–199)
HDL: 33 mg/dL — ABNORMAL LOW (ref >39)
LDL Calculated: 57 mg/dL (ref 0–99)
Triglycerides: 144 mg/dL (ref 0–149)
VLDL Cholesterol Cal: 29 mg/dL (ref 5–40)

## 2018-09-14 ENCOUNTER — Encounter: Payer: Self-pay | Admitting: Internal Medicine

## 2018-09-14 ENCOUNTER — Ambulatory Visit (INDEPENDENT_AMBULATORY_CARE_PROVIDER_SITE_OTHER): Payer: Medicare Other | Admitting: Internal Medicine

## 2018-09-14 ENCOUNTER — Encounter (INDEPENDENT_AMBULATORY_CARE_PROVIDER_SITE_OTHER): Payer: Self-pay

## 2018-09-14 VITALS — BP 166/70 | HR 75 | Ht 72.0 in | Wt 190.0 lb

## 2018-09-14 DIAGNOSIS — I10 Essential (primary) hypertension: Secondary | ICD-10-CM

## 2018-09-14 DIAGNOSIS — I7409 Other arterial embolism and thrombosis of abdominal aorta: Secondary | ICD-10-CM

## 2018-09-14 DIAGNOSIS — I2510 Atherosclerotic heart disease of native coronary artery without angina pectoris: Secondary | ICD-10-CM

## 2018-09-14 DIAGNOSIS — E782 Mixed hyperlipidemia: Secondary | ICD-10-CM

## 2018-09-14 NOTE — Progress Notes (Addendum)
09/14/2018 Dwayne Cuevas   1951-01-17  [id number]  Primary Physician Sinda Du, MD Primary Cardiologist: Dr. Harrington Challenger  F/u of CAD    HPI: Patient si a 67 yo with a history of PVOD, COPD, PE and coronary calcifications  He had a GXT Myovue in 2017  Suspicious for variable soft tissue attenuation vs small region of apical ischemia   LVEF 70%  2D echo on 06/11/17 showed normal LVEF at 60-65% with norma l wall motion without regional abnormalities. There was mild AI. The aortic root and ascending aorta were normal in size.  Patient says since seen feels stronger   Has some muscle aches though  Current Meds  Medication Sig  . allopurinol (ZYLOPRIM) 300 MG tablet Take 300 mg by mouth daily as needed.  [name] Kitchen aspirin EC 81 MG tablet Take 81 mg by mouth daily.  . clindamycin (CLEOCIN) 150 MG capsule Take 150 mg by mouth as directed. Before dental appointments  . co-enzyme Q-10 30 MG capsule Take 30 mg by mouth daily.  [name] Kitchen Cod Liver Oil CAPS Take 1 capsule by mouth at bedtime.  . Cyanocobalamin (VITAMIN B 12 PO) Take 500 mg by mouth daily.  . rosuvastatin (CRESTOR) 40 MG tablet Take 1 tablet (40 mg total) by mouth daily.  . vitamin C (ASCORBIC ACID) 500 MG tablet Take 500 mg by mouth daily.  . Vitamin E 100 units TABS Take 100 Units by mouth daily.    Allergies  Allergen Reactions  . Bee Venom     Swelling, shortness of breathe  . Shellfish Allergy Other (See Comments)    Gout, not allergy, patient doesn't eat because it causes gout  . Penicillins Rash and Other (See Comments)    Has patient had a PCN reaction causing immediate rash, facial/tongue/throat swelling, SOB or lightheadedness with hypotension: Yes Has patient had a PCN reaction causing severe rash involving mucus membranes or skin necrosis: No Has patient had a PCN reaction that required hospitalization No Has patient had a PCN reaction occurring within the last 10 years: No If all of the above answers are "NO", then may proceed  with Cephalosporin use.    Past Medical History:  Diagnosis Date  . Cancer ([place])    skin cancer removed from leg and head  . COPD (chronic obstructive pulmonary disease) ([place])   . Coronary artery disease    "a couple blockages on each side of my heart" pt reports this but has not sure if he has had a cath  . DVT (deep venous thrombosis) ([place]) 02/05/2016  . Gout   . Hypercholesteremia   . Hypertension   . PE (pulmonary embolism) 02/05/2016  . Scoliosis    Family History  Problem Relation Age of Onset  . Heart disease Mother   . Heart disease Father   . Cystic fibrosis Paternal Aunt    Past Surgical History:  Procedure Laterality Date  . ABDOMINAL AORTOGRAM W/LOWER EXTREMITY N/A 09/04/2017   Procedure: ABDOMINAL AORTOGRAM W/LOWER EXTREMITY;  Surgeon: Conrad Chilili, MD;  Location: [place] [facility];  Service: Cardiovascular;  Laterality: N/A;  . AORTA - BILATERAL FEMORAL ARTERY BYPASS GRAFT Bilateral 10/14/2017   Procedure: AORTA BIFEMORAL BYPASS GRAFT;  Surgeon: Conrad [place], MD;  Location: [place];  Service: Vascular;  Laterality: Bilateral;  . FEMORAL-POPLITEAL BYPASS GRAFT Right 10/14/2017   Procedure: Right FEMORAL-POPLITEAL ARTERY Bypass graft using gortex graft;  Surgeon: Conrad [place], MD;  Location: [facility] OR;  Service: Vascular;  Laterality: Right;  .  FOOT SURGERY Left    "put my foot back together"   . INTRAOPERATIVE ARTERIOGRAM Left 10/14/2017   Procedure: INTRA OPERATIVE ARTERIOGRAM;  Surgeon: Conrad [place], MD;  Location: [place];  Service: Vascular;  Laterality: Left;  . NECK SURGERY     discs removed, steel plate  . PERIPHERAL VASCULAR INTERVENTION Left 09/04/2017   Procedure: PERIPHERAL VASCULAR INTERVENTION;  Surgeon: Conrad [place], MD;  Location: [facility];  Service: Cardiovascular;  Laterality: Left;  left common iliac  . SKIN CANCER EXCISION    . THROMBECTOMY FEMORAL ARTERY Left 10/14/2017   Procedure: THROMBECTOMY Left Lower leg;  Surgeon: Conrad [place],  MD;  Location: [facility] OR;  Service: Vascular;  Laterality: Left;   Social History   Socioeconomic History  . Marital status: Divorced    Spouse name: Not on file  . Number of children: Not on file  . Years of education: Not on file  . Highest education level: Not on file  Occupational History  . Not on file  Social Needs  . Financial resource strain: Not on file  . Food insecurity:    Worry: Not on file    Inability: Not on file  . Transportation needs:    Medical: Not on file    Non-medical: Not on file  Tobacco Use  . Smoking status: Former Smoker    Types: Cigarettes    Last attempt to quit: 08/31/2017    Years since quitting: 1.0  . Smokeless tobacco: Never Used  Substance and Sexual Activity  . Alcohol use: No    Frequency: Never  . Drug use: No  . Sexual activity: Not on file  Lifestyle  . Physical activity:    Days per week: Not on file    Minutes per session: Not on file  . Stress: Not on file  Relationships  . Social connections:    Talks on phone: Not on file    Gets together: Not on file    Attends religious service: Not on file    Active member of club or organization: Not on file    Attends meetings of clubs or organizations: Not on file    Relationship status: Not on file  . Intimate partner violence:    Fear of current or ex partner: Not on file    Emotionally abused: Not on file    Physically abused: Not on file    Forced sexual activity: Not on file  Other Topics Concern  . Not on file  Social History Narrative  . Not on file     Review of Systems: General: negative for chills, fever, night sweats or weight changes.  Cardiovascular: negative for chest pain, dyspnea on exertion, edema, orthopnea, palpitations, paroxysmal nocturnal dyspnea or shortness of breath Dermatological: negative for rash Respiratory: negative for cough or wheezing Urologic: negative for hematuria Abdominal: negative for nausea, vomiting, diarrhea, bright red blood per  rectum, melena, or hematemesis Neurologic: negative for visual changes, syncope, or dizziness All other systems reviewed and are otherwise negative except as noted above.   Physical Exam:  Blood pressure (!) 166/70, pulse 75, height 6' (1.829 m), weight 190 lb (86.2 kg), SpO2 98 %.   My check of BP   180/80 bilateral arms   HEENT:   NCAT Neck:   JVP is normal    Lungs are CTA bilaterally Cardiac exam:   RRR   Normal S1, S2   No S3   No murmurs Abd is supple  nohepatomegaly Ext are without edema  EKG No EKG   ASSESSMENT AND PLAN:   1  CAD   Pt has evid of CAD on CT scan   She went on to have a   Low risk myovue   No symptoms of angina   FOllow     2   PAD  REcent vasc surgery    3  HL  ipids in June LDL 57   HDL 33  With symptoms I Would recomm that  she hold Crestor and call back in a couple weeks to see how feels    4  HTN   BP is up today   More on my check   This is only time   Recomm he take BP cuff to vascular appt to get check against theirs.   If high there will need to change Rx    5  COPD Breathing is stable     F/u  In October   Dwayne Cuevas [place], [facility] 09/14/2018 9:57 AM

## 2018-09-14 NOTE — Patient Instructions (Signed)
Medication Instructions:  Your physician has recommended you make the following change in your medication:   stop crestor for 2 weeks, call office to report response/how you are  feeling  If you need a refill on your cardiac medications before your next appointment, please call your pharmacy.   Lab work: none If you have labs (blood work) drawn today and your tests are completely normal, you will receive your results only by: [name] Kitchen MyChart Message (if you have MyChart) OR . A paper copy in the mail If you have any lab test that is abnormal or we need to change your treatment, we will call you to review the results.  Testing/Procedures: none  Follow-Up: At [facility], you and your health needs are our priority.  As part of our continuing mission to provide you with exceptional heart care, we have created designated Provider Care Teams.  These Care Teams include your primary Cardiologist (physician) and Advanced Practice Providers (APPs -  Physician Assistants and Nurse Practitioners) who all work together to provide you with the care you need, when you need it. You will need a follow up appointment in:  6 months.  Please call our office 2 months in advance to schedule this appointment.  You may see Dorris Carnes, MD or one of the following Advanced Practice Providers on your designated Care Team: Richardson Dopp, PA-C [place], [place] . Daune Perch, NP  Any Other Special Instructions Will Be Listed Below (If Applicable). Take prescription with you for blood pressure to be checked at next visit Check bp at home and record.

## 2018-09-16 ENCOUNTER — Ambulatory Visit: Payer: Medicare Other | Admitting: Vascular Surgery

## 2018-09-16 ENCOUNTER — Encounter (HOSPITAL_COMMUNITY): Payer: Medicare Other

## 2018-09-21 ENCOUNTER — Other Ambulatory Visit: Payer: Self-pay | Admitting: Vascular Surgery

## 2018-09-21 ENCOUNTER — Ambulatory Visit (INDEPENDENT_AMBULATORY_CARE_PROVIDER_SITE_OTHER)
Admission: RE | Admit: 2018-09-21 | Discharge: 2018-09-21 | Disposition: A | Discharge status: Home / Self Care | Payer: Medicare Other | Source: Ambulatory Visit | Attending: Vascular Surgery | Admitting: Vascular Surgery

## 2018-09-21 ENCOUNTER — Ambulatory Visit (HOSPITAL_COMMUNITY)
Admission: RE | Admit: 2018-09-21 | Discharge: 2018-09-21 | Disposition: A | Discharge status: Home / Self Care | Payer: Medicare Other | Source: Ambulatory Visit | Attending: Vascular Surgery | Admitting: Vascular Surgery

## 2018-09-21 DIAGNOSIS — I739 Peripheral vascular disease, unspecified: Secondary | ICD-10-CM

## 2018-09-21 DIAGNOSIS — I7409 Other arterial embolism and thrombosis of abdominal aorta: Secondary | ICD-10-CM | POA: Diagnosis not present

## 2018-09-21 DIAGNOSIS — Z23 Encounter for immunization: Secondary | ICD-10-CM | POA: Diagnosis not present

## 2018-09-21 DIAGNOSIS — I70229 Atherosclerosis of native arteries of extremities with rest pain, unspecified extremity: Secondary | ICD-10-CM

## 2018-09-21 DIAGNOSIS — I998 Other disorder of circulatory system: Secondary | ICD-10-CM | POA: Diagnosis not present

## 2018-09-22 ENCOUNTER — Other Ambulatory Visit: Payer: Self-pay

## 2018-09-22 ENCOUNTER — Encounter: Payer: Self-pay | Admitting: Vascular Surgery

## 2018-09-22 ENCOUNTER — Ambulatory Visit (INDEPENDENT_AMBULATORY_CARE_PROVIDER_SITE_OTHER): Payer: Medicare Other | Admitting: Vascular Surgery

## 2018-09-22 VITALS — BP 147/69 | HR 70 | Temp 97.2°F | Resp 16 | Ht 72.0 in | Wt 186.0 lb

## 2018-09-22 DIAGNOSIS — I2510 Atherosclerotic heart disease of native coronary artery without angina pectoris: Secondary | ICD-10-CM | POA: Diagnosis not present

## 2018-09-22 DIAGNOSIS — Z95828 Presence of other vascular implants and grafts: Secondary | ICD-10-CM | POA: Diagnosis not present

## 2018-09-22 DIAGNOSIS — I7409 Other arterial embolism and thrombosis of abdominal aorta: Secondary | ICD-10-CM

## 2018-09-22 NOTE — Progress Notes (Signed)
[facility] [place]  Patient name: Dwayne Cuevas MRN: [id number] DOB: Jan 09, 1951 Sex: male  REASON FOR VISIT: Follow-up aortobifemoral bypass and prosthetic right femoropopliteal bypass Dr. [name] on 10/14/2017  HPI: Dwayne Cuevas is a 67 y.o. male for follow-up.  He reports that he continues to have some numbness on his right calf and pretibial area and left thigh.  I explained this was related to the skin incision and should continue to improve but may stabilize out to some chronic numbness.  He is not limited by this.  He is having no claudication type symptoms.  He reports that he is having some issues with blood pressure management and is being taken care of by his cardiologist.  Past Medical History:  Diagnosis Date  . Cancer ([place])    skin cancer removed from leg and head  . COPD (chronic obstructive pulmonary disease) ([place])   . Coronary artery disease    "a couple blockages on each side of my heart" pt reports this but has not sure if he has had a cath  . DVT (deep venous thrombosis) ([place]) 02/05/2016  . Gout   . Hypercholesteremia   . Hypertension   . PE (pulmonary embolism) 02/05/2016  . Scoliosis     Family History  Problem Relation Age of Onset  . Heart disease Mother   . Heart disease Father   . Cystic fibrosis Paternal Aunt     SOCIAL HISTORY: Social History   Tobacco Use  . Smoking status: Former Smoker    Types: Cigarettes    Last attempt to quit: 08/31/2017    Years since quitting: 1.0  . Smokeless tobacco: Never Used  Substance Use Topics  . Alcohol use: No    Frequency: Never    Allergies  Allergen Reactions  . Bee Venom     Swelling, shortness of breathe  . Shellfish Allergy Other (See Comments)    Gout, not allergy, patient doesn't eat because it causes gout  . Penicillins Rash and Other (See Comments)    Has patient had a PCN reaction causing immediate rash, facial/tongue/throat  swelling, SOB or lightheadedness with hypotension: Yes Has patient had a PCN reaction causing severe rash involving mucus membranes or skin necrosis: No Has patient had a PCN reaction that required hospitalization No Has patient had a PCN reaction occurring within the last 10 years: No If all of the above answers are "NO", then may proceed with Cephalosporin use.     Current Outpatient Medications  Medication Sig Dispense Refill  . allopurinol (ZYLOPRIM) 300 MG tablet Take 300 mg by mouth daily as needed.    [name] Kitchen aspirin EC 81 MG tablet Take 81 mg by mouth daily.    [name] Kitchen co-enzyme Q-10 30 MG capsule Take 100 mg by mouth daily.     [name] Kitchen Cod Liver Oil CAPS Take 1 capsule by mouth at bedtime.    . Cyanocobalamin (VITAMIN B 12 PO) Take 500 mg by mouth daily.    . rosuvastatin (CRESTOR) 40 MG tablet Take 1 tablet (40 mg total) by mouth daily. 90 tablet 2  . vitamin C (ASCORBIC ACID) 500 MG tablet Take 500 mg by mouth daily.    . Vitamin E 100 units TABS Take 100 Units by mouth daily.     . clindamycin (CLEOCIN) 150 MG capsule Take 150 mg by mouth as directed. Before dental appointments  3   No current facility-administered medications for this visit.  REVIEW OF SYSTEMS:  [X]  denotes positive finding, [ ]  denotes negative finding Cardiac  Comments:  Chest pain or chest pressure:    Shortness of breath upon exertion:    Short of breath when lying flat:    Irregular heart rhythm:        Vascular    Pain in calf, thigh, or hip brought on by ambulation:    Pain in feet at night that wakes you up from your sleep:     Blood clot in your veins:    Leg swelling:           PHYSICAL EXAM: Vitals:   09/22/18 0849  BP: (!) 147/69  Pulse: 70  Resp: 16  Temp: (!) 97.2 F (36.2 C)  TempSrc: Oral  SpO2: 97%  Weight: 186 lb (84.4 kg)  Height: 6' (1.829 m)    GENERAL: The patient is a well-nourished male, in no acute distress. The vital signs are documented above. CARDIOVASCULAR: 2+ radial  pulses.  His femoral incisions are well-healed with no evidence of fluid collection.  He has easily palpable dorsalis pedis pulses bilaterally. PULMONARY: There is good air exchange  MUSCULOSKELETAL: There are no major deformities or cyanosis. NEUROLOGIC: No focal weakness or paresthesias are detected. SKIN: There are no ulcers or rashes noted. PSYCHIATRIC: The patient has a normal affect.  DATA:  Noninvasive studies from yesterday were reviewed with the patient.  His aortoiliac duplex showed no evidence of stenosis or fluid.  His ankle arm index was normal bilaterally and his right femoral to popliteal graft scan showed no evidence of stenosis  MEDICAL ISSUES: He will continue his walking program.  Will see [place] again in 1 year with repeat noninvasive studies    Rosetta Posner, MD [facility] Tel [phone] Pager [phone]

## 2018-09-25 ENCOUNTER — Telehealth: Payer: Self-pay | Admitting: *Deleted

## 2018-09-25 NOTE — Telephone Encounter (Signed)
Received fax from [facility]. Pt had appointment 09/22/18.  BP checked by office cuff and with patient's cuff. This was sent for Dr. Harrington Challenger to review per last OV note in cardiology. BP was elevated at that visit.  [facility] cuff: BP 147/69 Pt's cuff: BP 161/77  Will route to Dr. Harrington Challenger for review.

## 2018-09-27 NOTE — Telephone Encounter (Signed)
BP is not optimal   (Her cuff may be reading a little higher than office cuff) I would reocmm 2.5 mg amlodipine     Come in with BP cuff in 1 month  Again, with her cuff as well

## 2018-09-28 NOTE — Telephone Encounter (Signed)
Called patient.  He bought a new cuff (OMRON) and has started to record BPs. Will fax list today to Dr. Harrington Challenger to review.  He has enalapril/hctz 10/25 mg at home.  He took this prior to his vascular surgery in December and then BPs were low so he was instructed to stop. Said Dr. Harrington Challenger told him in Jan to hold onto this medication because may need to restart at some point. Aware I will review with Dr. Harrington Challenger and show her his most recent readings and call him back.  Stopped Crestor for aches/pains and notices a slight difference but overall no big change.  Is willing to resume this.

## 2018-10-12 NOTE — Telephone Encounter (Signed)
List of blood pressure readings given to Dr. Harrington Challenger for review range from 10-28 through 11/11. BPs range from 130-173 over 60-80s.  One reading 117/91.    HRs 60s -70s.  See notes below regarding enalapril/hctz 10/25 mg.

## 2018-10-13 MED ORDER — ENALAPRIL-HYDROCHLOROTHIAZIDE 10-25 MG PO TABS: 0.5000 | ORAL_TABLET | Freq: Every day | ORAL | 3 refills | Status: DC

## 2018-10-13 MED ORDER — ROSUVASTATIN CALCIUM 20 MG PO TABS: 20.0000 mg | ORAL_TABLET | Freq: Every day | ORAL | 3 refills | Status: DC

## 2018-10-13 NOTE — Telephone Encounter (Signed)
Clarified with Dr. Harrington Challenger, patient is to add back enalapril hctz 10/25 mg and keep monitoring BP at home.  Spoke with patient who stated a couple readings have been lower 90s-100s.  Thinks may have not had enough to drink around those couple readings.  He will start with half tab of enalapril/hctz and monitor BPs and fax a list to Dr. Harrington Challenger.  He will be sure to stay hydrated.  Adv that if he gets dizzy/low readings that Dr. Harrington Challenger will want him to stop this, even though some of his pressures are on the higher side.  Pt also asked about Crestor.  He stopped about 3 weeks ago due to muscle aches but has not noticed a difference since stopped.  His LDL was 57.  He is going to restart Crestor at 20 mg daily and see how he feels.   Pt aware I am forwarding back to Dr. Harrington Challenger for review of the above and if any new recommendations will call him back.

## 2018-10-13 NOTE — Telephone Encounter (Signed)
Keep on same meds     Labile BP   Do not want to increase to where low values go too low and he is dizzy/ passing out

## 2018-10-13 NOTE — Telephone Encounter (Signed)
Follow up    Patient is following up in reference to his BP and the suggestions of Dr. Harrington Challenger. Please call.

## 2018-11-30 DIAGNOSIS — E785 Hyperlipidemia, unspecified: Secondary | ICD-10-CM | POA: Diagnosis not present

## 2018-11-30 DIAGNOSIS — J449 Chronic obstructive pulmonary disease, unspecified: Secondary | ICD-10-CM | POA: Diagnosis not present

## 2018-11-30 DIAGNOSIS — I2510 Atherosclerotic heart disease of native coronary artery without angina pectoris: Secondary | ICD-10-CM | POA: Diagnosis not present

## 2018-11-30 DIAGNOSIS — I10 Essential (primary) hypertension: Secondary | ICD-10-CM | POA: Diagnosis not present

## 2019-02-11 ENCOUNTER — Telehealth: Payer: Self-pay | Admitting: Internal Medicine

## 2019-02-11 MED ORDER — ENALAPRIL MALEATE 5 MG PO TABS: 5.0000 mg | ORAL_TABLET | Freq: Every day | ORAL | 3 refills | Status: DC

## 2019-02-11 MED ORDER — HYDROCHLOROTHIAZIDE 12.5 MG PO CAPS: 12.5000 mg | ORAL_CAPSULE | Freq: Every day | ORAL | 3 refills | Status: DC

## 2019-02-11 MED ORDER — ENALAPRIL-HYDROCHLOROTHIAZIDE 10-25 MG PO TABS: 0.5000 | ORAL_TABLET | Freq: Every day | ORAL | 3 refills | Status: DC

## 2019-02-11 NOTE — Telephone Encounter (Signed)
[facility] is requesting to split the combination medication enalapril-HCTZ 10-25 mg tablet, into two different medications, enalapril 10 mg and hydrochlorothiazide 25 mg tablet, because the combination medication is not available. Please address

## 2019-02-11 NOTE — Telephone Encounter (Signed)
New scripts were sent

## 2019-02-11 NOTE — Telephone Encounter (Signed)
Pharmacy is unable to get vaseretic in new lowered dose but pt does not mind cutting medication. Refill sent to pharmacy.

## 2019-02-11 NOTE — Addendum Note (Signed)
Addended by: Jonathon Jordan on: 02/11/2019 05:14 PM   Modules accepted: Orders

## 2019-02-11 NOTE — Telephone Encounter (Addendum)
Ok to send in separate rx for enalapril 10mg  and HCTZ 25mg  - will need to clarify actual dosing because I'm not sure why pharmacy would request the 10mg  and 25mg  doses. If patient is cutting tablet in half, would send in enalapril 5mg  tablet and HCTZ 12.5mg  strength.

## 2019-02-11 NOTE — Telephone Encounter (Signed)
Pt takes vaseretic 10/25 but cuts the pill in half.  Pharmacy wants to split med to enalapril 10 mg and HCTZ 25mg .  I will forward to dr/nurse to advise new strength to avoid pt cutting.

## 2019-02-11 NOTE — Telephone Encounter (Signed)
  Mr Levick says that Dr Harrington Challenger called him in a new blood pressure medication, he doesn't know the name, and the pharmacy told him that they cannot get the medication right now. Mr Obryant says the pharmacy was going to fax something over about that and he only has about five days left of his old blood pressure medication.

## 2019-03-11 ENCOUNTER — Telehealth: Payer: Self-pay

## 2019-03-11 NOTE — Telephone Encounter (Signed)
Virtual Visit Pre-Appointment Phone Call  "(Name), I am calling you today to discuss your upcoming appointment. We are currently trying to limit exposure to the virus that causes COVID-19 by seeing patients at home rather than in the office."  1. "What is the BEST phone number to call the day of the visit?" - include this in appointment notes  2. Do you have or have access to (through a family member/friend) a smartphone with video capability that we can use for your visit?" a. If yes - list this number in appt notes as cell (if different from BEST phone #) and list the appointment type as a VIDEO visit in appointment notes b. If no - list the appointment type as a PHONE visit in appointment notes  3. Confirm consent - "In the setting of the current Covid19 crisis, you are scheduled for a PHONE visit with your provider on 4/27 at 130pm.  Just as we do with many in-office visits, in order for you to participate in this visit, we must obtain consent.  If you'd like, I can send this to your mychart (if signed up) or email for you to review.  Otherwise, I can obtain your verbal consent now.  All virtual visits are billed to your insurance company just like a normal visit would be.  By agreeing to a virtual visit, we'd like you to understand that the technology does not allow for your provider to perform an examination, and thus may limit your provider's ability to fully assess your condition. If your provider identifies any concerns that need to be evaluated in person, we will make arrangements to do so.  Finally, though the technology is pretty good, we cannot assure that it will always work on either your or our end, and in the setting of a video visit, we may have to convert it to a phone-only visit.  In either situation, we cannot ensure that we have a secure connection.  Are you willing to proceed?" STAFF: Did the patient verbally acknowledge consent to telehealth visit? Document YES/NO here:  YES  4. Advise patient to be prepared - "Two hours prior to your appointment, go ahead and check your blood pressure, pulse, oxygen saturation, and your weight (if you have the equipment to check those) and write them all down. When your visit starts, your provider will ask you for this information. If you have an Apple Watch or Kardia device, please plan to have heart rate information ready on the day of your appointment. Please have a pen and paper handy nearby the day of the visit as well."  5. Give patient instructions for MyChart download to smartphone OR [email or web address] as below if video visit (depending on what platform provider is using)  6. Inform patient they will receive a phone call 15 minutes prior to their appointment time (may be from unknown caller ID) so they should be prepared to answer    TELEPHONE CALL NOTE  Dwayne Cuevas has been deemed a candidate for a follow-up tele-health visit to limit community exposure during the Covid-19 pandemic. I spoke with the patient via phone to ensure availability of phone/video source, confirm preferred email & phone number, and discuss instructions and expectations.  I reminded Dwayne Cuevas to be prepared with any vital sign and/or heart rhythm information that could potentially be obtained via home monitoring, at the time of his visit. I reminded Dwayne Cuevas to expect a phone call prior to his visit.  Wilma Flavin, RN 03/11/2019 10:53 AM

## 2019-03-15 ENCOUNTER — Telehealth (INDEPENDENT_AMBULATORY_CARE_PROVIDER_SITE_OTHER): Payer: Medicare Other | Admitting: Internal Medicine

## 2019-03-15 ENCOUNTER — Other Ambulatory Visit: Payer: Self-pay

## 2019-03-15 ENCOUNTER — Encounter: Payer: Self-pay | Admitting: Internal Medicine

## 2019-03-15 VITALS — BP 140/72 | HR 68 | Ht 72.0 in

## 2019-03-15 DIAGNOSIS — I7409 Other arterial embolism and thrombosis of abdominal aorta: Secondary | ICD-10-CM

## 2019-03-15 DIAGNOSIS — E782 Mixed hyperlipidemia: Secondary | ICD-10-CM

## 2019-03-15 DIAGNOSIS — I10 Essential (primary) hypertension: Secondary | ICD-10-CM

## 2019-03-15 DIAGNOSIS — I2510 Atherosclerotic heart disease of native coronary artery without angina pectoris: Secondary | ICD-10-CM

## 2019-03-15 NOTE — Patient Instructions (Signed)
Medication Instructions:  No changes If you need a refill on your cardiac medications before your next appointment, please call your pharmacy.   Lab work: We will mail you a prescription to take with you to Dr. Luan Pulling office for blood work next time, with results to be faxed to Dr. Harrington Challenger.  If you have labs (blood work) drawn today and your tests are completely normal, you will receive your results only by: [name] Kitchen MyChart Message (if you have MyChart) OR . A paper copy in the mail If you have any lab test that is abnormal or we need to change your treatment, we will call you to review the results.  Testing/Procedures: none  Follow-Up: At [facility], you and your health needs are our priority.  As part of our continuing mission to provide you with exceptional heart care, we have created designated Provider Care Teams.  These Care Teams include your primary Cardiologist (physician) and Advanced Practice Providers (APPs -  Physician Assistants and Nurse Practitioners) who all work together to provide you with the care you need, when you need it. You will need a follow up appointment in:  6 months.  Please call our office 2 months in advance to schedule this appointment.  You may see Dorris Carnes, MD or one of the following Advanced Practice Providers on your designated Care Team: Richardson Dopp, PA-C [place], [place] . Daune Perch, NP  Any Other Special Instructions Will Be Listed Below (If Applicable).

## 2019-03-15 NOTE — Progress Notes (Signed)
Virtual Visit via Telephone Note   This visit type was conducted due to national recommendations for restrictions regarding the COVID-19 Pandemic (e.g. social distancing) in an effort to limit this patient's exposure and mitigate transmission in our community.  Due to his co-morbid illnesses, this patient is at least at moderate risk for complications without adequate follow up.  This format is felt to be most appropriate for this patient at this time.  The patient did not have access to video technology/had technical difficulties with video requiring transitioning to audio format only (telephone).  All issues noted in this document were discussed and addressed.  No physical exam could be performed with this format.  Please refer to the patient's chart for his  consent to telehealth for [facility].   Evaluation Performed:  Follow-up visit  Date:  03/15/2019   ID:  Dwayne Cuevas, DOB Mar 24, 1951, MRN [id number]  Patient Location: Home Provider Location: Home  PCP:  Sinda Du, MD  Cardiologist:  Dorris Carnes, MD Electrophysiologist:  None   Chief Complaint:  F/U of CAD   History of Present Illness:    Dwayne Cuevas is a 68 y.o. 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   myovue in 2017 susp for variable soft tissue attenuatBrion vs small region of ischemia   LVEF 70%   Echo 2018 LVEF 60 to 65%   Mild AI   I saw the pt back in October 2019   At last visit I recomm a trial of holding crestor     The pt says he has reduced crestor to 20 mg   Doing OK     Breathing :   Wil get a little winded with stairs or long distance Legs doing OK  F/u with T Early next fall  Saw Dr Luan Pulling in January 2020   The patient does not  have symptoms concerning for COVID-19 infection (fever, chills, cough, or new shortness of breath).    Past Medical History:  Diagnosis Date  . Cancer ([place])    skin cancer removed from leg and head  .  COPD (chronic obstructive pulmonary disease) ([place])   . Coronary artery disease    "a couple blockages on each side of my heart" pt reports this but has not sure if he has had a cath  . DVT (deep venous thrombosis) ([place]) 02/05/2016  . Gout   . Hypercholesteremia   . Hypertension   . PE (pulmonary embolism) 02/05/2016  . Scoliosis    Past Surgical History:  Procedure Laterality Date  . ABDOMINAL AORTOGRAM W/LOWER EXTREMITY N/A 09/04/2017   Procedure: ABDOMINAL AORTOGRAM W/LOWER EXTREMITY;  Surgeon: Conrad Atlantic, MD;  Location: [place];  Service: Cardiovascular;  Laterality: N/A;  . AORTA - BILATERAL FEMORAL ARTERY BYPASS GRAFT Bilateral 10/14/2017   Procedure: AORTA BIFEMORAL BYPASS GRAFT;  Surgeon: Conrad Skyline, MD;  Location: [facility];  Service: Vascular;  Laterality: Bilateral;  . FEMORAL-POPLITEAL BYPASS GRAFT Right 10/14/2017   Procedure: Right FEMORAL-POPLITEAL ARTERY Bypass graft using gortex graft;  Surgeon: Conrad Lightstreet, MD;  Location: [facility] OR;  Service: Vascular;  Laterality: Right;  . FOOT SURGERY Left    "put my foot back together"   . INTRAOPERATIVE ARTERIOGRAM Left 10/14/2017   Procedure: INTRA OPERATIVE ARTERIOGRAM;  Surgeon: Conrad Steward, MD;  Location: [facility];  Service: Vascular;  Laterality: Left;  . NECK SURGERY     discs removed, steel  plate  . PERIPHERAL VASCULAR INTERVENTION Left 09/04/2017   Procedure: PERIPHERAL VASCULAR INTERVENTION;  Surgeon: Conrad Loomis, MD;  Location: [place] [facility];  Service: Cardiovascular;  Laterality: Left;  left common iliac  . SKIN CANCER EXCISION    . THROMBECTOMY FEMORAL ARTERY Left 10/14/2017   Procedure: THROMBECTOMY Left Lower leg;  Surgeon: Conrad Goliad, MD;  [facility];  Service: Vascular;  Laterality: Left;     Current Meds  Medication Sig  . allopurinol (ZYLOPRIM) 300 MG tablet Take 300 mg by mouth daily as needed.  [name] Kitchen aspirin EC 81 MG tablet Take 81 mg by mouth daily.  . clindamycin (CLEOCIN) 150 MG  capsule Take 150 mg by mouth as directed. Before dental appointments  . co-enzyme Q-10 30 MG capsule Take 100 mg by mouth daily.   [name] Kitchen Cod Liver Oil CAPS Take 1 capsule by mouth at bedtime.  . Cyanocobalamin (VITAMIN B 12 PO) Take 500 mg by mouth daily.  . enalapril (VASOTEC) 5 MG tablet Take 1 tablet (5 mg total) by mouth daily.  [name] Kitchen guaiFENesin (MUCUS RELIEF PO) Take by mouth as directed.  . hydrochlorothiazide (MICROZIDE) 12.5 MG capsule Take 1 capsule (12.5 mg total) by mouth daily.  . rosuvastatin (CRESTOR) 20 MG tablet Take 1 tablet (20 mg total) by mouth daily.  . vitamin C (ASCORBIC ACID) 500 MG tablet Take 500 mg by mouth daily.  . Vitamin E 100 units TABS Take 100 Units by mouth daily.   . Zinc 50 MG CAPS Take 50 mg by mouth daily.     Allergies:   Bee venom; Shellfish allergy; and Penicillins   Social History   Tobacco Use  . Smoking status: Former Smoker    Types: Cigarettes    Last attempt to quit: 08/31/2017    Years since quitting: 1.5  . Smokeless tobacco: Never Used  Substance Use Topics  . Alcohol use: No    Frequency: Never  . Drug use: No     Family Hx: The patient's family history includes Cystic fibrosis in his paternal aunt; Heart disease in his father and mother.  ROS:   Please see the history of present illness.    All other systems reviewed and are negative.   Prior CV studies:   The following studies were reviewed today: Echo Nov 2018   LVEF 55 to 60%     Labs/Other Tests and Data Reviewed:    EKG:  Not done due to tele visit  Recent Labs: No results found for requested labs within last 8760 hours.   Recent Lipid Panel Lab Results  Component Value Date/Time   CHOL 119 07/15/2018 07:51 AM   TRIG 144 07/15/2018 07:51 AM   HDL 33 (L) 07/15/2018 07:51 AM   CHOLHDL 3.6 07/15/2018 07:51 AM   LDLCALC 57 07/15/2018 07:51 AM    Wt Readings from Last 3 Encounters:  09/22/18 186 lb (84.4 kg)  09/14/18 190 lb (86.2 kg)  03/11/18 178 lb (80.7 kg)      Objective:    Vital Signs:  BP 140/72   Pulse 68   Ht 6' (1.829 m)   BMI 25.23 kg/m    Not done as televisit   Vitals reviewed  ASSESSMENT & PLAN:    1. CAD   No symptoms of ischemia   Mild  2   PVOD   Follows  3  HL   Will send slip for lipids when sees Dr Luan Pulling agin  4  HTN   BP is OK     5  COVID-19 Education: The signs and symptoms of COVID-19 were discussed with the patient and how to seek care for testing (follow up with PCP or arrange E-visit).  The importance of social distancing was discussed today.  Time:   Today, I have spent 25 minutes with the patient with telehealth technology discussing the above problems.     Medication Adjustments/Labs and Tests Ordered: Current medicines are reviewed at length with the patient today.  Concerns regarding medicines are outlined above.   Tests Ordered: No orders of the defined types were placed in this encounter.   Medication Changes: No orders of the defined types were placed in this encounter.   Disposition:  Follow up In Fall 2020  Labs  With Dr Luan Pulling  Signed, Dorris Carnes, MD  03/15/2019 1:32 PM    [place]

## 2019-04-12 DIAGNOSIS — J449 Chronic obstructive pulmonary disease, unspecified: Secondary | ICD-10-CM | POA: Diagnosis not present

## 2019-04-12 DIAGNOSIS — I739 Peripheral vascular disease, unspecified: Secondary | ICD-10-CM | POA: Diagnosis not present

## 2019-04-12 DIAGNOSIS — I2510 Atherosclerotic heart disease of native coronary artery without angina pectoris: Secondary | ICD-10-CM | POA: Diagnosis not present

## 2019-04-12 DIAGNOSIS — I10 Essential (primary) hypertension: Secondary | ICD-10-CM | POA: Diagnosis not present

## 2019-04-14 DIAGNOSIS — I739 Peripheral vascular disease, unspecified: Secondary | ICD-10-CM | POA: Diagnosis not present

## 2019-04-14 DIAGNOSIS — I2510 Atherosclerotic heart disease of native coronary artery without angina pectoris: Secondary | ICD-10-CM | POA: Diagnosis not present

## 2019-04-14 DIAGNOSIS — I10 Essential (primary) hypertension: Secondary | ICD-10-CM | POA: Diagnosis not present

## 2019-04-14 DIAGNOSIS — J449 Chronic obstructive pulmonary disease, unspecified: Secondary | ICD-10-CM | POA: Diagnosis not present

## 2019-04-14 DIAGNOSIS — Z125 Encounter for screening for malignant neoplasm of prostate: Secondary | ICD-10-CM | POA: Diagnosis not present

## 2019-04-14 LAB — COMPREHENSIVE METABOLIC PANEL
ALT: 12 (ref 3–30)
AST: 15
Albumin/Globulin Ratio: 2.1
Albumin: 4.7
Alkaline Phosphatase: 87
BUN/Creatinine Ratio: 19
BUN: 27 — AB (ref 4–21)
Calcium: 9.9
Carbon Dioxide, Total: 19
Chloride: 104
Creat: 1.42
EGFR (African American): 59
EGFR (Non-African Amer.): 51
Globulin, Total: 2.2
Glucose: 97
Potassium: 5.1
Sodium: 140
Total Bilirubin: 0.4
Total Protein: 6.9 (ref 6.4–8.2)

## 2019-04-15 LAB — CBC
Basophil %: 1
Basophils Absolute: 0
Eosinophils Absolute: 0
Eosinophils, %: 1
HCT: 44 — AB (ref 29–41)
Hemoglobin: 15
Immature Granulocytes: 1
Lymphocytes absolute: 2.8 10*3/uL — AB (ref 0.1–1.8)
MCH: 30.5
MCHC: 33.9
MCV: 90 (ref 76–111)
Monocytes(Absolute): 0.6
Monocytes: 7
Neutrophils Absolute: 6 /uL
Neutrophils: 60
RBC: 4.92
RDW: 12.4
WBC: 9.5
lymph#: 30
platelet count: 221

## 2019-04-15 LAB — LIPID PANEL
Cholesterol: 166
HDL Cholesterol: 29 — AB (ref 35–70)
Triglycerides: 411 — AB (ref 40–160)

## 2019-04-15 LAB — TSH: TSH: 1.18

## 2019-06-16 ENCOUNTER — Other Ambulatory Visit: Payer: Self-pay

## 2019-07-13 DIAGNOSIS — E785 Hyperlipidemia, unspecified: Secondary | ICD-10-CM | POA: Diagnosis not present

## 2019-07-13 DIAGNOSIS — J449 Chronic obstructive pulmonary disease, unspecified: Secondary | ICD-10-CM | POA: Diagnosis not present

## 2019-07-13 DIAGNOSIS — I2510 Atherosclerotic heart disease of native coronary artery without angina pectoris: Secondary | ICD-10-CM | POA: Diagnosis not present

## 2019-07-13 DIAGNOSIS — I10 Essential (primary) hypertension: Secondary | ICD-10-CM | POA: Diagnosis not present

## 2019-07-14 DIAGNOSIS — I2510 Atherosclerotic heart disease of native coronary artery without angina pectoris: Secondary | ICD-10-CM | POA: Diagnosis not present

## 2019-07-14 DIAGNOSIS — J449 Chronic obstructive pulmonary disease, unspecified: Secondary | ICD-10-CM | POA: Diagnosis not present

## 2019-07-14 DIAGNOSIS — I10 Essential (primary) hypertension: Secondary | ICD-10-CM | POA: Diagnosis not present

## 2019-07-14 DIAGNOSIS — E785 Hyperlipidemia, unspecified: Secondary | ICD-10-CM | POA: Diagnosis not present

## 2019-07-15 LAB — LIPID PANEL
Cholesterol: 132
HDL Cholesterol: 42 (ref 35–70)
LDL Cholesterol: 54
Triglycerides: 178 — AB (ref 40–160)
VLDL Cholesterol Cal: 36

## 2019-07-15 LAB — BASIC METABOLIC PANEL
BUN/Creatinine Ratio: 11
BUN: 15 (ref 4–21)
Calcium: 9.5
Carbon Dioxide, Total: 23
Chloride: 103
Creat: 1.36
EGFR (African American): 62
EGFR (Non-African Amer.): 53
Glucose: 91
Potassium: 4.6
Sodium: 142

## 2019-08-19 DIAGNOSIS — Z23 Encounter for immunization: Secondary | ICD-10-CM | POA: Diagnosis not present

## 2019-08-26 NOTE — Progress Notes (Signed)
08/27/2019 Dwayne Cuevas   April 29, 1951  [id number]  Primary Physician Sinda Du, MD Primary Cardiologist: Dr. Harrington Challenger  F/u of CAD    HPI: Patient si a 68 yo with a history of PVOD, COPD, PE and coronary calcifications  He had a GXT Myovue in 2017  Suspicious for variable soft tissue attenuation vs small region of apical ischemia   LVEF 70%  2D echo on 06/11/17 showed normal LVEF at 60-65% with norma l wall motion without regional abnormalities. There was mild AI. The aortic root and ascending aorta were normal in size.  I saw the pt in 2019 Since seen he denies CP  Breathing is stable   Does not do much given COVID   Is going to gym a little    Does not like mask  Still with numbness in R foot   Due to see T Early     Current Meds  Medication Sig  . allopurinol (ZYLOPRIM) 300 MG tablet Take 300 mg by mouth daily as needed.  [name] Kitchen aspirin EC 81 MG tablet Take 81 mg by mouth daily.  . clindamycin (CLEOCIN) 150 MG capsule Take 150 mg by mouth as directed. Before dental appointments  . co-enzyme Q-10 30 MG capsule Take 100 mg by mouth daily.   [name] Kitchen Cod Liver Oil CAPS Take 1 capsule by mouth at bedtime.  . Cyanocobalamin (VITAMIN B 12 PO) Take 500 mg by mouth daily.  . enalapril (VASOTEC) 5 MG tablet Take 1 tablet (5 mg total) by mouth daily.  [name] Kitchen guaiFENesin (MUCUS RELIEF PO) Take by mouth as directed.  . hydrochlorothiazide (MICROZIDE) 12.5 MG capsule Take 1 capsule (12.5 mg total) by mouth daily.  [name] Kitchen MAGNESIUM PO Take 1 tablet by mouth as directed.  . rosuvastatin (CRESTOR) 20 MG tablet Take 1 tablet (20 mg total) by mouth daily.  . vitamin C (ASCORBIC ACID) 500 MG tablet Take 500 mg by mouth daily.  . Vitamin E 100 units TABS Take 100 Units by mouth daily.   . Zinc 50 MG CAPS Take 50 mg by mouth daily.   Allergies  Allergen Reactions  . Bee Venom     Swelling, shortness of breathe  . Shellfish Allergy Other (See Comments)    Gout, not allergy, patient doesn't eat because it causes  gout  . Penicillins Rash and Other (See Comments)    Has patient had a PCN reaction causing immediate rash, facial/tongue/throat swelling, SOB or lightheadedness with hypotension: Yes Has patient had a PCN reaction causing severe rash involving mucus membranes or skin necrosis: No Has patient had a PCN reaction that required hospitalization No Has patient had a PCN reaction occurring within the last 10 years: No If all of the above answers are "NO", then may proceed with Cephalosporin use.    Past Medical History:  Diagnosis Date  . Cancer ([place])    skin cancer removed from leg and head  . COPD (chronic obstructive pulmonary disease) ([place])   . Coronary artery disease    "a couple blockages on each side of my heart" pt reports this but has not sure if he has had a cath  . DVT (deep venous thrombosis) ([place]) 02/05/2016  . Gout   . Hypercholesteremia   . Hypertension   . PE (pulmonary embolism) 02/05/2016  . Scoliosis    Family History  Problem Relation Age of Onset  . Heart disease Mother   . Heart disease Father   . Cystic fibrosis Paternal Aunt  Past Surgical History:  Procedure Laterality Date  . ABDOMINAL AORTOGRAM W/LOWER EXTREMITY N/A 09/04/2017   Procedure: ABDOMINAL AORTOGRAM W/LOWER EXTREMITY;  Surgeon: Conrad [place], MD;  Location: [place] [facility];  Service: Cardiovascular;  Laterality: N/A;  . AORTA - BILATERAL FEMORAL ARTERY BYPASS GRAFT Bilateral 10/14/2017   Procedure: AORTA BIFEMORAL BYPASS GRAFT;  Surgeon: Conrad [place], MD;  Location: [place];  Service: Vascular;  Laterality: Bilateral;  . FEMORAL-POPLITEAL BYPASS GRAFT Right 10/14/2017   Procedure: Right FEMORAL-POPLITEAL ARTERY Bypass graft using gortex graft;  Surgeon: Conrad [place], MD;  Location: [facility];  Service: Vascular;  Laterality: Right;  . FOOT SURGERY Left    "put my foot back together"   . INTRAOPERATIVE ARTERIOGRAM Left 10/14/2017   Procedure: INTRA OPERATIVE ARTERIOGRAM;  Surgeon: Conrad [place],  MD;  Location: [place];  Service: Vascular;  Laterality: Left;  . NECK SURGERY     discs removed, steel plate  . PERIPHERAL VASCULAR INTERVENTION Left 09/04/2017   Procedure: PERIPHERAL VASCULAR INTERVENTION;  Surgeon: Conrad [place], MD;  Location: [place] [facility];  Service: Cardiovascular;  Laterality: Left;  left common iliac  . SKIN CANCER EXCISION    . THROMBECTOMY FEMORAL ARTERY Left 10/14/2017   Procedure: THROMBECTOMY Left Lower leg;  Surgeon: Conrad [place], MD;  Location: [facility] OR;  Service: Vascular;  Laterality: Left;   Social History   Socioeconomic History  . Marital status: Divorced    Spouse name: Not on file  . Number of children: Not on file  . Years of education: Not on file  . Highest education level: Not on file  Occupational History  . Not on file  Social Needs  . Financial resource strain: Not on file  . Food insecurity    Worry: Not on file    Inability: Not on file  . Transportation needs    Medical: Not on file    Non-medical: Not on file  Tobacco Use  . Smoking status: Former Smoker    Types: Cigarettes    Quit date: 08/31/2017    Years since quitting: 1.9  . Smokeless tobacco: Never Used  Substance and Sexual Activity  . Alcohol use: No    Frequency: Never  . Drug use: No  . Sexual activity: Not on file  Lifestyle  . Physical activity    Days per week: Not on file    Minutes per session: Not on file  . Stress: Not on file  Relationships  . Social Herbalist on phone: Not on file    Gets together: Not on file    Attends religious service: Not on file    Active member of club or organization: Not on file    Attends meetings of clubs or organizations: Not on file    Relationship status: Not on file  . Intimate partner violence    Fear of current or ex partner: Not on file    Emotionally abused: Not on file    Physically abused: Not on file    Forced sexual activity: Not on file  Other Topics Concern  . Not on file  Social  History Narrative  . Not on file     Review of Systems: General: negative for chills, fever, night sweats or weight changes.  Cardiovascular: negative for chest pain, dyspnea on exertion, edema, orthopnea, palpitations, paroxysmal nocturnal dyspnea or shortness of breath Dermatological: negative for rash Respiratory: negative for cough or wheezing Urologic: negative for hematuria  Abdominal: negative for nausea, vomiting, diarrhea, bright red blood per rectum, melena, or hematemesis Neurologic: negative for visual changes, syncope, or dizziness All other systems reviewed and are otherwise negative except as noted above.   Physical Exam:  Blood pressure 130/60, pulse 67, height 6' (1.829 m), weight 187 lb 12.8 oz (85.2 kg).    HEENT:   NCAT Neck:   JVP is not elevated    Lungs are CTA  Decreased airflow    Cardiac exam:   RRR   Normal S1, S2   No S3   No murmurs Abd is supple  nohepatomegaly Ext are without edema  EKG EKG shows SR 67   RBBB     ASSESSMENT AND PLAN:   1  CAD   Pt has evid of CAD on CT scan Low risk myovue   No symtpoms of angina     2   PAD  Pt to see T Early soon    3  HL  Wll get full lipids from [facility]   4  HTN   BP is good  COntinue meds   5  COPD Breathing is steady  No longer smokes   F/U end of AUg 2021        Dorris Carnes PA-C, MHS [facility] 08/27/2019 10:18 AM

## 2019-08-27 ENCOUNTER — Encounter: Payer: Self-pay | Admitting: Internal Medicine

## 2019-08-27 ENCOUNTER — Ambulatory Visit (INDEPENDENT_AMBULATORY_CARE_PROVIDER_SITE_OTHER): Payer: Medicare Other | Admitting: Internal Medicine

## 2019-08-27 ENCOUNTER — Encounter (INDEPENDENT_AMBULATORY_CARE_PROVIDER_SITE_OTHER): Payer: Self-pay

## 2019-08-27 ENCOUNTER — Other Ambulatory Visit: Payer: Self-pay

## 2019-08-27 VITALS — BP 130/60 | HR 67 | Ht 72.0 in | Wt 187.8 lb

## 2019-08-27 DIAGNOSIS — I10 Essential (primary) hypertension: Secondary | ICD-10-CM | POA: Diagnosis not present

## 2019-08-27 DIAGNOSIS — I2510 Atherosclerotic heart disease of native coronary artery without angina pectoris: Secondary | ICD-10-CM

## 2019-08-27 NOTE — Patient Instructions (Signed)
Medication Instructions:  No changes If you need a refill on your cardiac medications before your next appointment, please call your pharmacy.   Lab work: None today If you have labs (blood work) drawn today and your tests are completely normal, you will receive your results only by: [name] Kitchen MyChart Message (if you have MyChart) OR . A paper copy in the mail If you have any lab test that is abnormal or we need to change your treatment, we will call you to review the results.  Testing/Procedures: none  Follow-Up: At [facility], you and your health needs are our priority.  As part of our continuing mission to provide you with exceptional heart care, we have created designated Provider Care Teams.  These Care Teams include your primary Cardiologist (physician) and Advanced Practice Providers (APPs -  Physician Assistants and Nurse Practitioners) who all work together to provide you with the care you need, when you need it. You will need a follow up appointment in:  10 months.  Please call our office 2 months in advance to schedule this appointment.  You may see Dorris Carnes, MD or one of the following Advanced Practice Providers on your designated Care Team: Richardson Dopp, PA-C [place], [place] . Daune Perch, NP  Any Other Special Instructions Will Be Listed Below (If Applicable).

## 2019-10-12 DIAGNOSIS — E785 Hyperlipidemia, unspecified: Secondary | ICD-10-CM | POA: Diagnosis not present

## 2019-10-12 DIAGNOSIS — J449 Chronic obstructive pulmonary disease, unspecified: Secondary | ICD-10-CM | POA: Diagnosis not present

## 2019-10-12 DIAGNOSIS — I2510 Atherosclerotic heart disease of native coronary artery without angina pectoris: Secondary | ICD-10-CM | POA: Diagnosis not present

## 2019-10-12 DIAGNOSIS — I10 Essential (primary) hypertension: Secondary | ICD-10-CM | POA: Diagnosis not present

## 2019-11-22 ENCOUNTER — Other Ambulatory Visit: Payer: Self-pay

## 2019-11-22 ENCOUNTER — Telehealth (HOSPITAL_COMMUNITY): Payer: Self-pay | Admitting: *Deleted

## 2019-11-22 DIAGNOSIS — I739 Peripheral vascular disease, unspecified: Secondary | ICD-10-CM

## 2019-11-22 DIAGNOSIS — I998 Other disorder of circulatory system: Secondary | ICD-10-CM

## 2019-11-22 DIAGNOSIS — I70229 Atherosclerosis of native arteries of extremities with rest pain, unspecified extremity: Secondary | ICD-10-CM

## 2019-11-22 NOTE — Telephone Encounter (Signed)

## 2019-11-23 ENCOUNTER — Ambulatory Visit (INDEPENDENT_AMBULATORY_CARE_PROVIDER_SITE_OTHER)
Admission: RE | Admit: 2019-11-23 | Discharge: 2019-11-23 | Disposition: A | Discharge status: Home / Self Care | Payer: Medicare Other | Source: Ambulatory Visit | Attending: Vascular Surgery | Admitting: Vascular Surgery

## 2019-11-23 ENCOUNTER — Other Ambulatory Visit: Payer: Self-pay

## 2019-11-23 ENCOUNTER — Ambulatory Visit (INDEPENDENT_AMBULATORY_CARE_PROVIDER_SITE_OTHER): Payer: Medicare Other | Admitting: Vascular Surgery

## 2019-11-23 ENCOUNTER — Ambulatory Visit (HOSPITAL_COMMUNITY)
Admission: RE | Admit: 2019-11-23 | Discharge: 2019-11-23 | Disposition: A | Discharge status: Home / Self Care | Payer: Medicare Other | Source: Ambulatory Visit | Attending: Vascular Surgery | Admitting: Vascular Surgery

## 2019-11-23 ENCOUNTER — Encounter: Payer: Self-pay | Admitting: Vascular Surgery

## 2019-11-23 VITALS — BP 124/66 | HR 67 | Temp 98.0°F | Resp 20 | Ht 72.0 in | Wt 191.0 lb

## 2019-11-23 DIAGNOSIS — I70229 Atherosclerosis of native arteries of extremities with rest pain, unspecified extremity: Secondary | ICD-10-CM

## 2019-11-23 DIAGNOSIS — Z95828 Presence of other vascular implants and grafts: Secondary | ICD-10-CM | POA: Diagnosis not present

## 2019-11-23 DIAGNOSIS — I998 Other disorder of circulatory system: Secondary | ICD-10-CM | POA: Diagnosis not present

## 2019-11-23 DIAGNOSIS — I7409 Other arterial embolism and thrombosis of abdominal aorta: Secondary | ICD-10-CM

## 2019-11-23 DIAGNOSIS — I739 Peripheral vascular disease, unspecified: Secondary | ICD-10-CM | POA: Diagnosis not present

## 2019-11-23 NOTE — Progress Notes (Signed)
[facility] [place]  Patient name: Dwayne Cuevas MRN: [id number] DOB: 1951/11/12 Sex: male  REASON FOR VISIT: Follow-up lower extremity arterial disease with prior aortobifemoral bypass and right femoral to popliteal bypass with Gore-Tex by Dr. Geryl Councilman in November 2018  HPI: Dwayne Cuevas is a 69 y.o. male here today for follow-up.  Reports 2 separate episodes where he had generalized weakness in his lower extremities and felt that he was unable to stand.  This was not right or left but was both lower extremities.  It was transient and ended quickly.  Otherwise he has had no difficulty with walking or other claudication type symptoms.  Past Medical History:  Diagnosis Date  . Cancer ([place])    skin cancer removed from leg and head  . COPD (chronic obstructive pulmonary disease) ([place])   . Coronary artery disease    "a couple blockages on each side of my heart" pt reports this but has not sure if he has had a cath  . DVT (deep venous thrombosis) ([place]) 02/05/2016  . Gout   . Hypercholesteremia   . Hypertension   . PE (pulmonary embolism) 02/05/2016  . Scoliosis     Family History  Problem Relation Age of Onset  . Heart disease Mother   . Heart disease Father   . Cystic fibrosis Paternal Aunt     SOCIAL HISTORY: Social History   Tobacco Use  . Smoking status: Former Smoker    Types: Cigarettes    Quit date: 08/31/2017    Years since quitting: 2.2  . Smokeless tobacco: Never Used  Substance Use Topics  . Alcohol use: No    Allergies  Allergen Reactions  . Bee Venom     Swelling, shortness of breathe  . Shellfish Allergy Other (See Comments)    Gout, not allergy, patient doesn't eat because it causes gout  . Penicillins Rash and Other (See Comments)    Has patient had a PCN reaction causing immediate rash, facial/tongue/throat swelling, SOB or lightheadedness with hypotension: Yes Has patient had a PCN reaction  causing severe rash involving mucus membranes or skin necrosis: No Has patient had a PCN reaction that required hospitalization No Has patient had a PCN reaction occurring within the last 10 years: No If all of the above answers are "NO", then may proceed with Cephalosporin use.     Current Outpatient Medications  Medication Sig Dispense Refill  . allopurinol (ZYLOPRIM) 300 MG tablet Take 300 mg by mouth daily as needed.    [name] Kitchen aspirin EC 81 MG tablet Take 81 mg by mouth daily.    . clindamycin (CLEOCIN) 150 MG capsule Take 150 mg by mouth as directed. Before dental appointments  3  . co-enzyme Q-10 30 MG capsule Take 100 mg by mouth daily.     [name] Kitchen Cod Liver Oil CAPS Take 1 capsule by mouth at bedtime.    . Cyanocobalamin (VITAMIN B 12 PO) Take 500 mg by mouth daily.    . enalapril (VASOTEC) 5 MG tablet Take 1 tablet (5 mg total) by mouth daily. 90 tablet 3  . guaiFENesin (MUCUS RELIEF PO) Take by mouth as directed.    . hydrochlorothiazide (MICROZIDE) 12.5 MG capsule Take 1 capsule (12.5 mg total) by mouth daily. 90 capsule 3  . MAGNESIUM PO Take 1 tablet by mouth as directed.    . rosuvastatin (CRESTOR) 20 MG tablet Take 1 tablet (20 mg total) by mouth daily. 90 tablet 3  .  vitamin C (ASCORBIC ACID) 500 MG tablet Take 500 mg by mouth daily.    . Vitamin E 100 units TABS Take 100 Units by mouth daily.     . Zinc 50 MG CAPS Take 50 mg by mouth daily.     No current facility-administered medications for this visit.    REVIEW OF SYSTEMS:  [X]  denotes positive finding, [ ]  denotes negative finding Cardiac  Comments:  Chest pain or chest pressure: x   Shortness of breath upon exertion: x   Short of breath when lying flat:    Irregular heart rhythm:        Vascular    Pain in calf, thigh, or hip brought on by ambulation: x   Pain in feet at night that wakes you up from your sleep:  x   Blood clot in your veins:    Leg swelling:           PHYSICAL EXAM: Vitals:   11/23/19 1335    BP: 124/66  Pulse: 67  Resp: 20  Temp: 98 F (36.7 C)  SpO2: 97%  Weight: 191 lb (86.6 kg)  Height: 6' (1.829 m)    GENERAL: The patient is a well-nourished male, in no acute distress. The vital signs are documented above. CARDIOVASCULAR: 2+ radial 2+ femoral 2+ popliteal pulses bilaterally.  Palpable dorsalis pedis pulses bilaterally.  Well-healed midline incision with probable small upper abdominal incisional hernia PULMONARY: There is good air exchange  MUSCULOSKELETAL: There are no major deformities or cyanosis. NEUROLOGIC: No focal weakness or paresthesias are detected. SKIN: There are no ulcers or rashes noted. PSYCHIATRIC: The patient has a normal affect.  DATA:  Noninvasive studies revealed normal ankle arm index bilaterally with widely patent right femoropopliteal bypass  MEDICAL ISSUES: Stable overall.  We will continue his walking program.  Unclear as to the etiology of his episodes of lower extremity weakness.  Does not appear to be consistent with arterial insufficiency or vascular disease.  We will continue to monitor.  We will see him again in 1 year with duplex of his right femoropopliteal bypass and lower extremity noninvasive studies    Rosetta Posner, MD [facility] Tel [phone] Pager [phone]

## 2019-11-26 ENCOUNTER — Other Ambulatory Visit: Payer: Self-pay | Admitting: *Deleted

## 2019-11-26 DIAGNOSIS — Z95828 Presence of other vascular implants and grafts: Secondary | ICD-10-CM

## 2019-11-26 DIAGNOSIS — I739 Peripheral vascular disease, unspecified: Secondary | ICD-10-CM

## 2020-01-29 ENCOUNTER — Ambulatory Visit: Payer: Medicare Other | Attending: Internal Medicine

## 2020-01-29 DIAGNOSIS — Z23 Encounter for immunization: Secondary | ICD-10-CM

## 2020-01-29 NOTE — Progress Notes (Signed)
   Covid-19 Vaccination Clinic  Name:  Dwayne Cuevas    MRN: [id number] DOB: [birth year]  01/29/2020  Dwayne Cuevas was observed post Covid-19 immunization for 15 minutes without incident. He was provided with Vaccine Information Sheet and instruction to access the V-Safe system.   Dwayne Cuevas was instructed to call 911 with any severe reactions post vaccine: [name] Kitchen Difficulty breathing  . Swelling of face and throat  . A fast heartbeat  . A bad rash all over body  . Dizziness and weakness   Immunizations Administered    Name Date Dose VIS Date Route   Moderna COVID-19 Vaccine 01/29/2020  3:03 PM 0.5 mL 10/19/2019 Intramuscular   Manufacturer: Moderna   Lot: [id number]   [place]:[id number]

## 2020-02-15 ENCOUNTER — Other Ambulatory Visit: Payer: Self-pay

## 2020-02-15 MED ORDER — HYDROCHLOROTHIAZIDE 12.5 MG PO CAPS: 12.5000 mg | ORAL_CAPSULE | Freq: Every day | ORAL | 1 refills | Status: DC

## 2020-02-15 MED ORDER — ENALAPRIL MALEATE 5 MG PO TABS: 5.0000 mg | ORAL_TABLET | Freq: Every day | ORAL | 1 refills | Status: DC

## 2020-02-15 NOTE — Telephone Encounter (Signed)
Pt medications were sent to pt's pharmacy as requested. Confirmation received.

## 2020-03-01 ENCOUNTER — Ambulatory Visit: Payer: Medicare Other | Attending: Internal Medicine

## 2020-03-01 DIAGNOSIS — Z23 Encounter for immunization: Secondary | ICD-10-CM

## 2020-03-01 NOTE — Progress Notes (Signed)
   Covid-19 Vaccination Clinic  Name:  Dwayne Cuevas    MRN: [id number] DOB: 02/23/1951  03/01/2020  Dwayne Cuevas was observed post Covid-19 immunization for 15 minutes without incident. He was provided with Vaccine Information Sheet and instruction to access the V-Safe system.   Dwayne Cuevas was instructed to call 911 with any severe reactions post vaccine: [name] Kitchen Difficulty breathing  . Swelling of face and throat  . A fast heartbeat  . A bad rash all over body  . Dizziness and weakness   Immunizations Administered    Name Date Dose VIS Date Route   Moderna COVID-19 Vaccine 03/01/2020 12:07 PM 0.5 mL 10/19/2019 Intramuscular   Manufacturer: Moderna   Lot: [id number]   [id number]

## 2020-03-13 DIAGNOSIS — I10 Essential (primary) hypertension: Secondary | ICD-10-CM | POA: Diagnosis not present

## 2020-03-13 DIAGNOSIS — E785 Hyperlipidemia, unspecified: Secondary | ICD-10-CM | POA: Diagnosis not present

## 2020-03-13 DIAGNOSIS — M1A00X0 Idiopathic chronic gout, unspecified site, without tophus (tophi): Secondary | ICD-10-CM | POA: Diagnosis not present

## 2020-03-13 DIAGNOSIS — Z0189 Encounter for other specified special examinations: Secondary | ICD-10-CM | POA: Diagnosis not present

## 2020-03-13 DIAGNOSIS — I25119 Atherosclerotic heart disease of native coronary artery with unspecified angina pectoris: Secondary | ICD-10-CM | POA: Diagnosis not present

## 2020-03-29 DIAGNOSIS — I10 Essential (primary) hypertension: Secondary | ICD-10-CM | POA: Diagnosis not present

## 2020-03-29 DIAGNOSIS — E785 Hyperlipidemia, unspecified: Secondary | ICD-10-CM | POA: Diagnosis not present

## 2020-03-29 DIAGNOSIS — R7301 Impaired fasting glucose: Secondary | ICD-10-CM | POA: Diagnosis not present

## 2020-03-29 DIAGNOSIS — M1A00X0 Idiopathic chronic gout, unspecified site, without tophus (tophi): Secondary | ICD-10-CM | POA: Diagnosis not present

## 2020-03-29 DIAGNOSIS — I25119 Atherosclerotic heart disease of native coronary artery with unspecified angina pectoris: Secondary | ICD-10-CM | POA: Diagnosis not present

## 2020-04-03 DIAGNOSIS — I25119 Atherosclerotic heart disease of native coronary artery with unspecified angina pectoris: Secondary | ICD-10-CM | POA: Diagnosis not present

## 2020-04-03 DIAGNOSIS — K469 Unspecified abdominal hernia without obstruction or gangrene: Secondary | ICD-10-CM | POA: Diagnosis not present

## 2020-04-03 DIAGNOSIS — N1831 Chronic kidney disease, stage 3a: Secondary | ICD-10-CM | POA: Diagnosis not present

## 2020-04-03 DIAGNOSIS — Z0001 Encounter for general adult medical examination with abnormal findings: Secondary | ICD-10-CM | POA: Diagnosis not present

## 2020-04-03 DIAGNOSIS — E785 Hyperlipidemia, unspecified: Secondary | ICD-10-CM | POA: Diagnosis not present

## 2020-04-03 DIAGNOSIS — I10 Essential (primary) hypertension: Secondary | ICD-10-CM | POA: Diagnosis not present

## 2020-04-03 DIAGNOSIS — M1A00X0 Idiopathic chronic gout, unspecified site, without tophus (tophi): Secondary | ICD-10-CM | POA: Diagnosis not present

## 2020-07-27 NOTE — Progress Notes (Signed)
07/28/2020 Dwayne Cuevas   [birth year]  [id number]  Primary Physician Celene Squibb, MD Primary Cardiologist: Dr. Harrington Challenger  F/u of CAD    HPI: Patient si a 69 yo with a history of PVOD, COPD, PE and coronary calcifications  He had a GXT Myovue in 2017  Suspicious for variable soft tissue attenuation vs small region of apical ischemia   LVEF 70%  2D echo on 06/11/17 showed normal LVEF at 60-65% with norma l wall motion without regional abnormalities. There was mild AI. The aortic root and ascending aorta were normal in size.  I saw the patient in 2020 \ Patient says he has had a few pains in chest   Erratic  Not associated with particular activity   He was given Rx last year for NTG (SL) [facility] but has not used it  Hasnt needed to  Breathing is stable  No wheezes  No signif LE edeam     Current Meds  Medication Sig  . allopurinol (ZYLOPRIM) 300 MG tablet Take 300 mg by mouth daily.   [name] Kitchen aspirin EC 81 MG tablet Take 81 mg by mouth daily.  . clindamycin (CLEOCIN) 150 MG capsule Take 150 mg by mouth as directed. Before dental appointments  . co-enzyme Q-10 30 MG capsule Take 100 mg by mouth daily.   . Cyanocobalamin (VITAMIN B 12 PO) Take 500 mg by mouth daily.  . enalapril (VASOTEC) 5 MG tablet Take 1 tablet (5 mg total) by mouth daily.  [name] Kitchen guaiFENesin (MUCUS RELIEF PO) Take by mouth as directed.  . hydrochlorothiazide (MICROZIDE) 12.5 MG capsule Take 1 capsule (12.5 mg total) by mouth daily.  [name] Kitchen MAGNESIUM PO Take 1 tablet by mouth as directed.  . nitroGLYCERIN (NITROSTAT) 0.4 MG SL tablet Place 0.4 mg under the tongue every 5 (five) minutes as needed for chest pain. Max 3 doses in 15 minutes  . Omega-3 Fatty Acids (FISH OIL) 1000 MG CAPS Take 1,000 mg by mouth daily.  . rosuvastatin (CRESTOR) 20 MG tablet Take 1 tablet (20 mg total) by mouth daily.  . vitamin C (ASCORBIC ACID) 500 MG tablet Take 500 mg by mouth daily.  . Vitamin E 100 units TABS Take 100 Units by mouth daily.   .  Zinc 50 MG CAPS Take 50 mg by mouth daily.  . [DISCONTINUED] enalapril (VASOTEC) 5 MG tablet Take 1 tablet (5 mg total) by mouth daily.  . [DISCONTINUED] hydrochlorothiazide (MICROZIDE) 12.5 MG capsule Take 1 capsule (12.5 mg total) by mouth daily.   Allergies  Allergen Reactions  . Bee Venom     Swelling, shortness of breathe  . Shellfish Allergy Other (See Comments)    Gout, not allergy, patient doesn't eat because it causes gout  . Penicillins Rash and Other (See Comments)    Has patient had a PCN reaction causing immediate rash, facial/tongue/throat swelling, SOB or lightheadedness with hypotension: Yes Has patient had a PCN reaction causing severe rash involving mucus membranes or skin necrosis: No Has patient had a PCN reaction that required hospitalization No Has patient had a PCN reaction occurring within the last 10 years: No If all of the above answers are "NO", then may proceed with Cephalosporin use.    Past Medical History:  Diagnosis Date  . Cancer ([place])    skin cancer removed from leg and head  . COPD (chronic obstructive pulmonary disease) ([place])   . Coronary artery disease    "a couple blockages on each side of  my heart" pt reports this but has not sure if he has had a cath  . DVT (deep venous thrombosis) ([place]) 02/05/2016  . Gout   . Hypercholesteremia   . Hypertension   . PE (pulmonary embolism) 02/05/2016  . Scoliosis    Family History  Problem Relation Age of Onset  . Heart disease Mother   . Heart disease Father   . Cystic fibrosis Paternal Aunt    Past Surgical History:  Procedure Laterality Date  . ABDOMINAL AORTOGRAM W/LOWER EXTREMITY N/A 09/04/2017   Procedure: ABDOMINAL AORTOGRAM W/LOWER EXTREMITY;  Surgeon: Conrad Woodbury, MD;  Location: [place] [facility];  Service: Cardiovascular;  Laterality: N/A;  . AORTA - BILATERAL FEMORAL ARTERY BYPASS GRAFT Bilateral 10/14/2017   Procedure: AORTA BIFEMORAL BYPASS GRAFT;  Surgeon: Conrad Amoret, MD;  Location:  [place];  Service: Vascular;  Laterality: Bilateral;  . FEMORAL-POPLITEAL BYPASS GRAFT Right 10/14/2017   Procedure: Right FEMORAL-POPLITEAL ARTERY Bypass graft using gortex graft;  Surgeon: Conrad Dierks, MD;  Location: [place] OR;  Service: Vascular;  Laterality: Right;  . FOOT SURGERY Left    "put my foot back together"   . INTRAOPERATIVE ARTERIOGRAM Left 10/14/2017   Procedure: INTRA OPERATIVE ARTERIOGRAM;  Surgeon: Conrad [place], MD;  Location: [place];  Service: Vascular;  Laterality: Left;  . NECK SURGERY     discs removed, steel plate  . PERIPHERAL VASCULAR INTERVENTION Left 09/04/2017   Procedure: PERIPHERAL VASCULAR INTERVENTION;  Surgeon: Conrad Huron, MD;  Location: [place] [facility];  Service: Cardiovascular;  Laterality: Left;  left common iliac  . SKIN CANCER EXCISION    . THROMBECTOMY FEMORAL ARTERY Left 10/14/2017   Procedure: THROMBECTOMY Left Lower leg;  Surgeon: Conrad , MD;  Location: [facility] OR;  Service: Vascular;  Laterality: Left;   Social History   Socioeconomic History  . Marital status: Divorced    Spouse name: Not on file  . Number of children: Not on file  . Years of education: Not on file  . Highest education level: Not on file  Occupational History  . Not on file  Tobacco Use  . Smoking status: Former Smoker    Types: Cigarettes    Quit date: 08/31/2017    Years since quitting: 2.9  . Smokeless tobacco: Never Used  Vaping Use  . Vaping Use: Never used  Substance and Sexual Activity  . Alcohol use: No  . Drug use: No  . Sexual activity: Not on file  Other Topics Concern  . Not on file  Social History Narrative  . Not on file   Social Determinants of Health   Financial Resource Strain:   . Difficulty of Paying Living Expenses: Not on file  Food Insecurity:   . Worried About Charity fundraiser in the Last Year: Not on file  . Ran Out of Food in the Last Year: Not on file  Transportation Needs:   . Lack of Transportation (Medical): Not on file    . Lack of Transportation (Non-Medical): Not on file  Physical Activity:   . Days of Exercise per Week: Not on file  . Minutes of Exercise per Session: Not on file  Stress:   . Feeling of Stress : Not on file  Social Connections:   . Frequency of Communication with Friends and Family: Not on file  . Frequency of Social Gatherings with Friends and Family: Not on file  . Attends Religious Services: Not on file  . Active  Member of Clubs or Organizations: Not on file  . Attends Archivist Meetings: Not on file  . Marital Status: Not on file  Intimate Partner Violence:   . Fear of Current or Ex-Partner: Not on file  . Emotionally Abused: Not on file  . Physically Abused: Not on file  . Sexually Abused: Not on file     Review of Systems:  All other systems reviewed and are otherwise negative except as noted above.   Physical Exam:  Blood pressure 140/60, pulse 71, height 5\' 11"  (1.803 m), weight 189 lb (85.7 kg), SpO2 96 %.    HEENT:   NCAT   Neck:   JVP is normal  No bruits    Lungs are CTA  Some decreased airflow  No wheezes   Cardiac exam:   RRR   Normal S1, S2   No S3   No murmurs Abd is supple  No hepatomegaly Ext are with Trivedema  EKG EKG is not done today    ASSESSMENT AND PLAN:   1  CAD   Pt has evid of CAD on CT scan Low risk myovue I am not convinced few episodes of chest pain reflect ischemia   Follow   2   PAD  Follows with [facility]    3  HL Will get lipids from [facility]    4  HTN   BP is fair   Follow   5  COPD Breathing is steady  No longer smokes   F/U in 10 months      Rosario Jacks, MHS [facility] 07/28/2020 5:05 PM

## 2020-07-28 ENCOUNTER — Encounter: Payer: Self-pay | Admitting: Internal Medicine

## 2020-07-28 ENCOUNTER — Ambulatory Visit (INDEPENDENT_AMBULATORY_CARE_PROVIDER_SITE_OTHER): Payer: Medicare Other | Admitting: Internal Medicine

## 2020-07-28 ENCOUNTER — Other Ambulatory Visit: Payer: Self-pay

## 2020-07-28 VITALS — BP 140/60 | HR 71 | Ht 71.0 in | Wt 189.0 lb

## 2020-07-28 DIAGNOSIS — E782 Mixed hyperlipidemia: Secondary | ICD-10-CM

## 2020-07-28 DIAGNOSIS — I10 Essential (primary) hypertension: Secondary | ICD-10-CM

## 2020-07-28 DIAGNOSIS — I2510 Atherosclerotic heart disease of native coronary artery without angina pectoris: Secondary | ICD-10-CM

## 2020-07-28 MED ORDER — HYDROCHLOROTHIAZIDE 12.5 MG PO CAPS: 12.5000 mg | ORAL_CAPSULE | Freq: Every day | ORAL | 3 refills | Status: DC

## 2020-07-28 MED ORDER — ENALAPRIL MALEATE 5 MG PO TABS: 5.0000 mg | ORAL_TABLET | Freq: Every day | ORAL | 3 refills | Status: DC

## 2020-07-28 NOTE — Patient Instructions (Addendum)
Medication Instructions:  No changes *If you need a refill on your cardiac medications before your next appointment, please call your pharmacy*   Lab Work: None If you have labs (blood work) drawn today and your tests are completely normal, you will receive your results only by: [name] Kitchen MyChart Message (if you have MyChart) OR . A paper copy in the mail If you have any lab test that is abnormal or we need to change your treatment, we will call you to review the results.   Testing/Procedures: none   Follow-Up: At [facility], you and your health needs are our priority.  As part of our continuing mission to provide you with exceptional heart care, we have created designated Provider Care Teams.  These Care Teams include your primary Cardiologist (physician) and Advanced Practice Providers (APPs -  Physician Assistants and Nurse Practitioners) who all work together to provide you with the care you need, when you need it.  We recommend signing up for the patient portal called "MyChart".  Sign up information is provided on this After Visit Summary.  MyChart is used to connect with patients for Virtual Visits (Telemedicine).  Patients are able to view lab/test results, encounter notes, upcoming appointments, etc.  Non-urgent messages can be sent to your provider as well.   To learn more about what you can do with MyChart, go to [email or web address].    Your next appointment:   10 month(s) (July 2022)  The format for your next appointment:   In Person  Provider:   You may see Dorris Carnes, MD or one of the following Advanced Practice Providers on your designated Care Team:    Richardson Dopp, PA-C  Robbie Lis, [place]    Other Instructions Addendum:   Message to chart prep/medical records to request copy of labs from PCP office.

## 2020-07-31 ENCOUNTER — Telehealth: Payer: Self-pay | Admitting: Internal Medicine

## 2020-07-31 NOTE — Telephone Encounter (Signed)
Medical records requested from Wende Neighbors, MD. 07/31/20 vlm

## 2020-10-04 DIAGNOSIS — I10 Essential (primary) hypertension: Secondary | ICD-10-CM | POA: Diagnosis not present

## 2020-10-04 DIAGNOSIS — Z712 Person consulting for explanation of examination or test findings: Secondary | ICD-10-CM | POA: Diagnosis not present

## 2020-10-04 DIAGNOSIS — Z125 Encounter for screening for malignant neoplasm of prostate: Secondary | ICD-10-CM | POA: Diagnosis not present

## 2020-10-04 DIAGNOSIS — R7301 Impaired fasting glucose: Secondary | ICD-10-CM | POA: Diagnosis not present

## 2020-10-04 DIAGNOSIS — E782 Mixed hyperlipidemia: Secondary | ICD-10-CM | POA: Diagnosis not present

## 2020-10-04 DIAGNOSIS — M1A00X0 Idiopathic chronic gout, unspecified site, without tophus (tophi): Secondary | ICD-10-CM | POA: Diagnosis not present

## 2020-10-09 DIAGNOSIS — E785 Hyperlipidemia, unspecified: Secondary | ICD-10-CM | POA: Diagnosis not present

## 2020-10-09 DIAGNOSIS — Z712 Person consulting for explanation of examination or test findings: Secondary | ICD-10-CM | POA: Diagnosis not present

## 2020-10-09 DIAGNOSIS — N1831 Chronic kidney disease, stage 3a: Secondary | ICD-10-CM | POA: Diagnosis not present

## 2020-10-09 DIAGNOSIS — I25119 Atherosclerotic heart disease of native coronary artery with unspecified angina pectoris: Secondary | ICD-10-CM | POA: Diagnosis not present

## 2020-10-09 DIAGNOSIS — Z23 Encounter for immunization: Secondary | ICD-10-CM | POA: Diagnosis not present

## 2020-10-09 DIAGNOSIS — I10 Essential (primary) hypertension: Secondary | ICD-10-CM | POA: Diagnosis not present

## 2020-10-09 DIAGNOSIS — R7301 Impaired fasting glucose: Secondary | ICD-10-CM | POA: Diagnosis not present

## 2020-10-09 DIAGNOSIS — M1A00X0 Idiopathic chronic gout, unspecified site, without tophus (tophi): Secondary | ICD-10-CM | POA: Diagnosis not present

## 2020-10-09 DIAGNOSIS — K469 Unspecified abdominal hernia without obstruction or gangrene: Secondary | ICD-10-CM | POA: Diagnosis not present

## 2020-11-21 ENCOUNTER — Ambulatory Visit (INDEPENDENT_AMBULATORY_CARE_PROVIDER_SITE_OTHER)
Admission: RE | Admit: 2020-11-21 | Discharge: 2020-11-21 | Disposition: A | Discharge status: Home / Self Care | Payer: Medicare Other | Source: Ambulatory Visit | Attending: Vascular Surgery | Admitting: Vascular Surgery

## 2020-11-21 ENCOUNTER — Ambulatory Visit (HOSPITAL_COMMUNITY)
Admission: RE | Admit: 2020-11-21 | Discharge: 2020-11-21 | Disposition: A | Discharge status: Home / Self Care | Payer: Medicare Other | Source: Ambulatory Visit | Attending: Vascular Surgery | Admitting: Vascular Surgery

## 2020-11-21 ENCOUNTER — Other Ambulatory Visit: Payer: Self-pay

## 2020-11-21 DIAGNOSIS — I739 Peripheral vascular disease, unspecified: Secondary | ICD-10-CM | POA: Insufficient documentation

## 2020-11-21 DIAGNOSIS — Z95828 Presence of other vascular implants and grafts: Secondary | ICD-10-CM | POA: Insufficient documentation

## 2020-11-27 ENCOUNTER — Encounter: Payer: Self-pay | Admitting: Vascular Surgery

## 2020-11-27 ENCOUNTER — Other Ambulatory Visit: Payer: Self-pay

## 2020-11-27 ENCOUNTER — Ambulatory Visit (INDEPENDENT_AMBULATORY_CARE_PROVIDER_SITE_OTHER): Payer: Medicare Other | Admitting: Vascular Surgery

## 2020-11-27 VITALS — BP 134/64 | HR 77 | Temp 98.4°F | Resp 16 | Ht 71.0 in | Wt 189.0 lb

## 2020-11-27 DIAGNOSIS — Z95828 Presence of other vascular implants and grafts: Secondary | ICD-10-CM | POA: Diagnosis not present

## 2020-11-27 DIAGNOSIS — I739 Peripheral vascular disease, unspecified: Secondary | ICD-10-CM

## 2020-11-27 NOTE — Progress Notes (Signed)
Vascular and Vein Specialist of [place]  Patient name: Dwayne Cuevas MRN: [id number] DOB: August 10, 1951 Sex: male  REASON FOR VISIT: Follow-up prior aortobifemoral bypass and right femoral to popliteal bypass  HPI: Dwayne Cuevas is a 70 y.o. male here for follow-up.  Had undergone the above surgery in 11/18 with Dr.Chen.  He continues to do well.  He walks with a cane related to some persistent numbness in his right foot related to preoperative ischemia.  He has no claudication symptoms  Past Medical History:  Diagnosis Date  . Cancer ([place])    skin cancer removed from leg and head  . COPD (chronic obstructive pulmonary disease) ([place])   . Coronary artery disease    "a couple blockages on each side of my heart" pt reports this but has not sure if he has had a cath  . DVT (deep venous thrombosis) ([place]) 02/05/2016  . Gout   . Hypercholesteremia   . Hypertension   . PE (pulmonary embolism) 02/05/2016  . Scoliosis     Family History  Problem Relation Age of Onset  . Heart disease Mother   . Heart disease Father   . Cystic fibrosis Paternal Aunt     SOCIAL HISTORY: Social History   Tobacco Use  . Smoking status: Former Smoker    Types: Cigarettes    Quit date: 08/31/2017    Years since quitting: 3.2  . Smokeless tobacco: Never Used  Substance Use Topics  . Alcohol use: No    Allergies  Allergen Reactions  . Bee Venom     Swelling, shortness of breathe  . Shellfish Allergy Other (See Comments)    Gout, not allergy, patient doesn't eat because it causes gout  . Penicillins Rash and Other (See Comments)    Has patient had a PCN reaction causing immediate rash, facial/tongue/throat swelling, SOB or lightheadedness with hypotension: Yes Has patient had a PCN reaction causing severe rash involving mucus membranes or skin necrosis: No Has patient had a PCN reaction that required hospitalization No Has patient had a PCN reaction occurring  within the last 10 years: No If all of the above answers are "NO", then may proceed with Cephalosporin use.     Current Outpatient Medications  Medication Sig Dispense Refill  . allopurinol (ZYLOPRIM) 300 MG tablet Take 300 mg by mouth daily.     [name] Kitchen aspirin EC 81 MG tablet Take 81 mg by mouth daily.    . clindamycin (CLEOCIN) 150 MG capsule Take 150 mg by mouth as directed. Before dental appointments  3  . co-enzyme Q-10 30 MG capsule Take 100 mg by mouth daily.     . Cyanocobalamin (VITAMIN B 12 PO) Take 500 mg by mouth daily.    . enalapril (VASOTEC) 5 MG tablet Take 1 tablet (5 mg total) by mouth daily. 90 tablet 3  . guaiFENesin (MUCUS RELIEF PO) Take by mouth as directed.    . hydrochlorothiazide (MICROZIDE) 12.5 MG capsule Take 1 capsule (12.5 mg total) by mouth daily. 90 capsule 3  . MAGNESIUM PO Take 1 tablet by mouth as directed.    . nitroGLYCERIN (NITROSTAT) 0.4 MG SL tablet Place 0.4 mg under the tongue every 5 (five) minutes as needed for chest pain. Max 3 doses in 15 minutes    . Omega-3 Fatty Acids (FISH OIL) 1000 MG CAPS Take 1,000 mg by mouth daily.    . rosuvastatin (CRESTOR) 20 MG tablet Take 1 tablet (20 mg total) by mouth  daily. 90 tablet 3  . vitamin C (ASCORBIC ACID) 500 MG tablet Take 500 mg by mouth daily.    . Vitamin E 100 units TABS Take 100 Units by mouth daily.     . Zinc 50 MG CAPS Take 50 mg by mouth daily.     No current facility-administered medications for this visit.    REVIEW OF SYSTEMS:  [X]  denotes positive finding, [ ]  denotes negative finding Cardiac  Comments:  Chest pain or chest pressure:    Shortness of breath upon exertion: x   Short of breath when lying flat:    Irregular heart rhythm:        Vascular    Pain in calf, thigh, or hip brought on by ambulation:    Pain in feet at night that wakes you up from your sleep:     Blood clot in your veins:    Leg swelling:           PHYSICAL EXAM: Vitals:   11/27/20 1045  BP: 134/64   Pulse: 77  Resp: 16  Temp: 98.4 F (36.9 C)  TempSrc: Other (Comment)  SpO2: 95%  Weight: 189 lb (85.7 kg)  Height: 5\' 11"  (1.803 m)    GENERAL: The patient is a well-nourished male, in no acute distress. The vital signs are documented above. CARDIOVASCULAR: Carotid arteries without bruits bilaterally.  2+ radial and 2+ femoral pulses bilaterally.  Palpable right popliteal graft pulse. PULMONARY: There is good air exchange  MUSCULOSKELETAL: There are no major deformities or cyanosis. NEUROLOGIC: No focal weakness or paresthesias are detected. SKIN: There are no ulcers or rashes noted. PSYCHIATRIC: The patient has a normal affect.  DATA:  Noninvasive studies from 11/21/2020 were reviewed with the patient.  This shows ankle arm index 1.94 bilaterally.  Does have duplex suggesting 50 to 70% stenosis in the proximal anastomosis of the right femoral to popliteal bypass  MEDICAL ISSUES: Stable overall.  We will continue his walking program.  Recommend that we see him again with repeat duplex in 1 year.  I did discuss symptoms of occluded bypass and he knows to notify us immediately should this occur    Rosetta Posner, MD [facility] Special[facility] Tel [phone]

## 2021-01-17 DIAGNOSIS — E785 Hyperlipidemia, unspecified: Secondary | ICD-10-CM | POA: Diagnosis not present

## 2021-01-17 DIAGNOSIS — I10 Essential (primary) hypertension: Secondary | ICD-10-CM | POA: Diagnosis not present

## 2021-01-17 DIAGNOSIS — Z712 Person consulting for explanation of examination or test findings: Secondary | ICD-10-CM | POA: Diagnosis not present

## 2021-01-17 DIAGNOSIS — Z23 Encounter for immunization: Secondary | ICD-10-CM | POA: Diagnosis not present

## 2021-01-17 DIAGNOSIS — M1A00X0 Idiopathic chronic gout, unspecified site, without tophus (tophi): Secondary | ICD-10-CM | POA: Diagnosis not present

## 2021-01-17 DIAGNOSIS — K469 Unspecified abdominal hernia without obstruction or gangrene: Secondary | ICD-10-CM | POA: Diagnosis not present

## 2021-01-17 DIAGNOSIS — N1831 Chronic kidney disease, stage 3a: Secondary | ICD-10-CM | POA: Diagnosis not present

## 2021-01-17 DIAGNOSIS — I25119 Atherosclerotic heart disease of native coronary artery with unspecified angina pectoris: Secondary | ICD-10-CM | POA: Diagnosis not present

## 2021-01-17 DIAGNOSIS — R7301 Impaired fasting glucose: Secondary | ICD-10-CM | POA: Diagnosis not present

## 2021-01-22 DIAGNOSIS — I10 Essential (primary) hypertension: Secondary | ICD-10-CM | POA: Diagnosis not present

## 2021-01-22 DIAGNOSIS — Z23 Encounter for immunization: Secondary | ICD-10-CM | POA: Diagnosis not present

## 2021-01-22 DIAGNOSIS — N1831 Chronic kidney disease, stage 3a: Secondary | ICD-10-CM | POA: Diagnosis not present

## 2021-01-22 DIAGNOSIS — M1A00X0 Idiopathic chronic gout, unspecified site, without tophus (tophi): Secondary | ICD-10-CM | POA: Diagnosis not present

## 2021-01-22 DIAGNOSIS — L97511 Non-pressure chronic ulcer of other part of right foot limited to breakdown of skin: Secondary | ICD-10-CM | POA: Diagnosis not present

## 2021-01-22 DIAGNOSIS — L209 Atopic dermatitis, unspecified: Secondary | ICD-10-CM | POA: Diagnosis not present

## 2021-01-22 DIAGNOSIS — E785 Hyperlipidemia, unspecified: Secondary | ICD-10-CM | POA: Diagnosis not present

## 2021-01-22 DIAGNOSIS — Z712 Person consulting for explanation of examination or test findings: Secondary | ICD-10-CM | POA: Diagnosis not present

## 2021-03-06 ENCOUNTER — Encounter (HOSPITAL_BASED_OUTPATIENT_CLINIC_OR_DEPARTMENT_OTHER): Payer: Medicare Other | Admitting: Internal Medicine

## 2021-06-26 NOTE — Progress Notes (Signed)
06/27/2021 Dwayne Cuevas   March 07, 1951  SN:[id number]  Primary Physician Celene Squibb, MD Primary Cardiologist: Dr. Harrington Challenger  F/u of CAD    HPI: Patient si a 70 yo with a history of PVOD, COPD, PE and coronary calcifications  He had a GXT Myovue in 2017  Suspicious for variable soft tissue attenuation vs small region of apical ischemia   LVEF 70%  2D echo on 06/11/17 showed normal LVEF at 60-65% with norma l wall motion without regional abnormalities. There was mild AI. The aortic root and ascending aorta were normal in size.  I saw the patient in Sept 2021  Since seen he has followed up with [facility] He denies CP   Says he does give out with activity though there is no recent / sudden changes   Thinks he may be deconditioned   Notes occsaional dizziness  Rare Says he is going to start going to his friend's gym      Says he drinks 3 to 4 cans of Coke per day   Current Meds  Medication Sig   allopurinol (ZYLOPRIM) 300 MG tablet Take 300 mg by mouth daily.    aspirin EC 81 MG tablet Take 81 mg by mouth daily.   clindamycin (CLEOCIN) 150 MG capsule Take 150 mg by mouth as directed. Before dental appointments   co-enzyme Q-10 30 MG capsule Take 100 mg by mouth daily.    Cyanocobalamin (VITAMIN B 12 PO) Take 500 mg by mouth daily.   enalapril (VASOTEC) 5 MG tablet Take 1 tablet (5 mg total) by mouth daily.   guaiFENesin (MUCUS RELIEF PO) Take by mouth as directed.   hydrochlorothiazide (MICROZIDE) 12.5 MG capsule Take 1 capsule (12.5 mg total) by mouth daily.   MAGNESIUM PO Take 1 tablet by mouth as directed.   nitroGLYCERIN (NITROSTAT) 0.4 MG SL tablet Place 0.4 mg under the tongue every 5 (five) minutes as needed for chest pain. Max 3 doses in 15 minutes   Omega-3 Fatty Acids (FISH OIL) 1000 MG CAPS Take 1,000 mg by mouth daily.   rosuvastatin (CRESTOR) 20 MG tablet Take 1 tablet (20 mg total) by mouth daily.   vitamin C (ASCORBIC ACID) 500 MG tablet Take 500 mg by mouth daily.   Vitamin E 100  units TABS Take 100 Units by mouth daily.    Zinc 50 MG CAPS Take 50 mg by mouth daily.   Allergies  Allergen Reactions   Bee Venom     Swelling, shortness of breathe   Shellfish Allergy Other (See Comments)    Gout, not allergy, patient doesn't eat because it causes gout   Penicillins Rash and Other (See Comments)    Has patient had a PCN reaction causing immediate rash, facial/tongue/throat swelling, SOB or lightheadedness with hypotension: Yes Has patient had a PCN reaction causing severe rash involving mucus membranes or skin necrosis: No Has patient had a PCN reaction that required hospitalization No Has patient had a PCN reaction occurring within the last 10 years: No If all of the above answers are "NO", then may proceed with Cephalosporin use.    Past Medical History:  Diagnosis Date   Cancer ([place])    skin cancer removed from leg and head   COPD (chronic obstructive pulmonary disease) ([place])    Coronary artery disease    "a couple blockages on each side of my heart" pt reports this but has not sure if he has had a cath   DVT (deep  venous thrombosis) ([place]) 02/05/2016   Gout    Hypercholesteremia    Hypertension    PE (pulmonary embolism) 02/05/2016   Scoliosis    Family History  Problem Relation Age of Onset   Heart disease Mother    Heart disease Father    Cystic fibrosis Paternal Aunt    Past Surgical History:  Procedure Laterality Date   ABDOMINAL AORTOGRAM W/LOWER EXTREMITY N/A 09/04/2017   Procedure: ABDOMINAL AORTOGRAM W/LOWER EXTREMITY;  Surgeon: Conrad Sparta, MD;  Location: [place] [facility];  Service: Cardiovascular;  Laterality: N/A;   AORTA - BILATERAL FEMORAL ARTERY BYPASS GRAFT Bilateral 10/14/2017   Procedure: AORTA BIFEMORAL BYPASS GRAFT;  Surgeon: Conrad [place], MD;  Location: [place];  Service: Vascular;  Laterality: Bilateral;   FEMORAL-POPLITEAL BYPASS GRAFT Right 10/14/2017   Procedure: Right FEMORAL-POPLITEAL ARTERY Bypass graft using gortex graft;   Surgeon: Conrad [place], MD;  Location: [place];  Service: Vascular;  Laterality: Right;   FOOT SURGERY Left    "put my foot back together"    INTRAOPERATIVE ARTERIOGRAM Left 10/14/2017   Procedure: INTRA OPERATIVE ARTERIOGRAM;  Surgeon: Conrad Menno, MD;  Location: [place];  Service: Vascular;  Laterality: Left;   NECK SURGERY     discs removed, steel plate   PERIPHERAL VASCULAR INTERVENTION Left 09/04/2017   Procedure: PERIPHERAL VASCULAR INTERVENTION;  Surgeon: Conrad [place], MD;  Location: [place] [facility];  Service: Cardiovascular;  Laterality: Left;  left common iliac   SKIN CANCER EXCISION     THROMBECTOMY FEMORAL ARTERY Left 10/14/2017   Procedure: THROMBECTOMY Left Lower leg;  Surgeon: Conrad , MD;  Location: [facility] OR;  Service: Vascular;  Laterality: Left;   Social History   Socioeconomic History   Marital status: Divorced    Spouse name: Not on file   Number of children: Not on file   Years of education: Not on file   Highest education level: Not on file  Occupational History   Not on file  Tobacco Use   Smoking status: Former    Types: Cigarettes    Quit date: 08/31/2017    Years since quitting: 3.8   Smokeless tobacco: Never  Vaping Use   Vaping Use: Never used  Substance and Sexual Activity   Alcohol use: No   Drug use: No   Sexual activity: Not on file  Other Topics Concern   Not on file  Social History Narrative   Not on file   Social Determinants of Health   Financial Resource Strain: Not on file  Food Insecurity: Not on file  Transportation Needs: Not on file  Physical Activity: Not on file  Stress: Not on file  Social Connections: Not on file  Intimate Partner Violence: Not on file     Review of Systems:  All other systems reviewed and are otherwise negative except as noted above.   Physical Exam:  Blood pressure 130/60, pulse 78, height '5\' 11"'$  (1.803 m), weight 189 lb (85.7 kg).    HEENT:   NCAT   Neck:   JVP is normal  No bruits     Lungs are CTA  Some decreased airflow  No wheezes   Cardiac exam:   RRR   Normal S1, S2   No S3   No murmur audible Abd is supple  No hepatomegaly Ext are with Triv edema 1+ PT  EKG EKG is done today    SR  78 bpm  RBBB  ASSESSMENT AND PLAN:  1  CAD   Pt has evid of CAD on CT scan   Myoview in 2017 without significant ischemia    The pt deneis CP but does say he gets SOB with activity  ? Deconditioning     He is joining gym   Follow as he increases activity    Plan to follow up in Jan 2023  2   PAD  Follows with [facility]  (Early)  s/p aortobifem bypasses and R fem/pop bypass in 2018    3  HL LDL in march 2022:  87  HDL 41  Trig 230   Admits to drinkng 3 to 4 sodas per day   Stop   Will check lipomed in January whnen I see him   4  HTN   BP is fair   Follow   5  COPD Breathing is steady  No longer smokes   F/U in 10 months      Rosario Jacks, MHS [facility] 06/27/2021 1:22 PM

## 2021-06-27 ENCOUNTER — Encounter: Payer: Self-pay | Admitting: Internal Medicine

## 2021-06-27 ENCOUNTER — Ambulatory Visit (INDEPENDENT_AMBULATORY_CARE_PROVIDER_SITE_OTHER): Payer: Medicare Other | Admitting: Internal Medicine

## 2021-06-27 ENCOUNTER — Other Ambulatory Visit: Payer: Self-pay

## 2021-06-27 VITALS — BP 130/60 | HR 78 | Ht 71.0 in | Wt 189.0 lb

## 2021-06-27 DIAGNOSIS — I2510 Atherosclerotic heart disease of native coronary artery without angina pectoris: Secondary | ICD-10-CM | POA: Diagnosis not present

## 2021-06-27 NOTE — Patient Instructions (Signed)
Medication Instructions:  Your physician recommends that you continue on your current medications as directed. Please refer to the Current Medication list given to you today.  *If you need a refill on your cardiac medications before your next appointment, please call your pharmacy*   Lab Work: NONE Today   If you have labs (blood work) drawn today and your tests are completely normal, you will receive your results only by: [place] (if you have MyChart) OR A paper copy in the mail If you have any lab test that is abnormal or we need to change your treatment, we will call you to review the results.   Testing/Procedures: NONE    Follow-Up: At [facility], you and your health needs are our priority.  As part of our continuing mission to provide you with exceptional heart care, we have created designated Provider Care Teams.  These Care Teams include your primary Cardiologist (physician) and Advanced Practice Providers (APPs -  Physician Assistants and Nurse Practitioners) who all work together to provide you with the care you need, when you need it.  We recommend signing up for the patient portal called "MyChart".  Sign up information is provided on this After Visit Summary.  MyChart is used to connect with patients for Virtual Visits (Telemedicine).  Patients are able to view lab/test results, encounter notes, upcoming appointments, etc.  Non-urgent messages can be sent to your provider as well.   To learn more about what you can do with MyChart, go to [email or web address].    Your next appointment:    January   The format for your next appointment:   In Person  Provider:   Dorris Carnes, MD   Other Instructions Thank you for choosing [facility]!

## 2021-07-19 DIAGNOSIS — I10 Essential (primary) hypertension: Secondary | ICD-10-CM | POA: Diagnosis not present

## 2021-07-19 DIAGNOSIS — R7301 Impaired fasting glucose: Secondary | ICD-10-CM | POA: Diagnosis not present

## 2021-07-19 DIAGNOSIS — M109 Gout, unspecified: Secondary | ICD-10-CM | POA: Diagnosis not present

## 2021-07-19 DIAGNOSIS — E782 Mixed hyperlipidemia: Secondary | ICD-10-CM | POA: Diagnosis not present

## 2021-07-24 DIAGNOSIS — E782 Mixed hyperlipidemia: Secondary | ICD-10-CM | POA: Diagnosis not present

## 2021-07-24 DIAGNOSIS — M1A9XX1 Chronic gout, unspecified, with tophus (tophi): Secondary | ICD-10-CM | POA: Diagnosis not present

## 2021-07-24 DIAGNOSIS — Z0001 Encounter for general adult medical examination with abnormal findings: Secondary | ICD-10-CM | POA: Diagnosis not present

## 2021-07-24 DIAGNOSIS — I739 Peripheral vascular disease, unspecified: Secondary | ICD-10-CM | POA: Diagnosis not present

## 2021-07-24 DIAGNOSIS — I10 Essential (primary) hypertension: Secondary | ICD-10-CM | POA: Diagnosis not present

## 2021-07-24 DIAGNOSIS — L97509 Non-pressure chronic ulcer of other part of unspecified foot with unspecified severity: Secondary | ICD-10-CM | POA: Diagnosis not present

## 2021-07-24 DIAGNOSIS — L409 Psoriasis, unspecified: Secondary | ICD-10-CM | POA: Diagnosis not present

## 2021-07-24 DIAGNOSIS — Z125 Encounter for screening for malignant neoplasm of prostate: Secondary | ICD-10-CM | POA: Diagnosis not present

## 2021-07-24 DIAGNOSIS — Z9862 Peripheral vascular angioplasty status: Secondary | ICD-10-CM | POA: Diagnosis not present

## 2021-07-24 DIAGNOSIS — N1831 Chronic kidney disease, stage 3a: Secondary | ICD-10-CM | POA: Diagnosis not present

## 2021-07-24 DIAGNOSIS — R7301 Impaired fasting glucose: Secondary | ICD-10-CM | POA: Diagnosis not present

## 2021-07-24 DIAGNOSIS — Z86010 Personal history of colonic polyps: Secondary | ICD-10-CM | POA: Diagnosis not present

## 2021-08-22 ENCOUNTER — Other Ambulatory Visit: Payer: Self-pay

## 2021-08-22 MED ORDER — HYDROCHLOROTHIAZIDE 12.5 MG PO CAPS: 12.5000 mg | ORAL_CAPSULE | Freq: Every day | ORAL | 3 refills | Status: DC

## 2021-08-22 MED ORDER — ENALAPRIL MALEATE 5 MG PO TABS: 5.0000 mg | ORAL_TABLET | Freq: Every day | ORAL | 3 refills | Status: DC

## 2021-08-22 NOTE — Telephone Encounter (Signed)
Pt's medications were sent to pt's pharmacy as requested. Confirmation received.  

## 2021-11-09 ENCOUNTER — Encounter: Payer: Self-pay | Admitting: *Deleted

## 2021-11-09 ENCOUNTER — Other Ambulatory Visit: Payer: Self-pay

## 2021-11-09 ENCOUNTER — Encounter: Payer: Self-pay | Admitting: Internal Medicine

## 2021-11-09 ENCOUNTER — Ambulatory Visit (INDEPENDENT_AMBULATORY_CARE_PROVIDER_SITE_OTHER): Payer: Medicare Other | Admitting: Internal Medicine

## 2021-11-09 VITALS — BP 144/78 | HR 84 | Ht 71.0 in | Wt 188.0 lb

## 2021-11-09 DIAGNOSIS — E782 Mixed hyperlipidemia: Secondary | ICD-10-CM | POA: Diagnosis not present

## 2021-11-09 DIAGNOSIS — Z23 Encounter for immunization: Secondary | ICD-10-CM | POA: Diagnosis not present

## 2021-11-09 DIAGNOSIS — I2510 Atherosclerotic heart disease of native coronary artery without angina pectoris: Secondary | ICD-10-CM | POA: Diagnosis not present

## 2021-11-09 MED ORDER — ROSUVASTATIN CALCIUM 40 MG PO TABS: 40.0000 mg | ORAL_TABLET | Freq: Every day | ORAL | 3 refills | Status: DC

## 2021-11-09 NOTE — Progress Notes (Signed)
11/09/2021 Dwayne Cuevas   01/31/1951  [id number]  Primary Physician Celene Squibb, MD Primary Cardiologist: Dr. Harrington Challenger  F/u of CAD    HPI: Patient si a 70 yo with a history of PVOD, COPD, PE and coronary calcifications  He had a GXT Myovue in 2017  Suspicious for variable soft tissue attenuation vs small region of apical ischemia   LVEF 70%  2D echo on 06/11/17 showed normal LVEF at 60-65% with normal wall motion without regional abnormalities. There was mild AI. The aortic root and ascending aorta were normal in size.  I saw the pt in AUg 2022  He denies CP   Breathing is stable   Mild edema   Current Meds  Medication Sig   allopurinol (ZYLOPRIM) 300 MG tablet Take 300 mg by mouth daily.    aspirin EC 81 MG tablet Take 81 mg by mouth daily.   clindamycin (CLEOCIN) 150 MG capsule Take 150 mg by mouth as directed. Before dental appointments   co-enzyme Q-10 30 MG capsule Take 100 mg by mouth daily.    Cyanocobalamin (VITAMIN B 12 PO) Take 500 mg by mouth daily.   enalapril (VASOTEC) 5 MG tablet Take 1 tablet (5 mg total) by mouth daily.   guaiFENesin (MUCUS RELIEF PO) Take by mouth as directed.   hydrochlorothiazide (MICROZIDE) 12.5 MG capsule Take 1 capsule (12.5 mg total) by mouth daily.   MAGNESIUM PO Take 1 tablet by mouth as directed.   nitroGLYCERIN (NITROSTAT) 0.4 MG SL tablet Place 0.4 mg under the tongue every 5 (five) minutes as needed for chest pain. Max 3 doses in 15 minutes   Omega-3 Fatty Acids (FISH OIL) 1000 MG CAPS Take 1,000 mg by mouth daily.   rosuvastatin (CRESTOR) 20 MG tablet Take 1 tablet (20 mg total) by mouth daily.   vitamin C (ASCORBIC ACID) 500 MG tablet Take 500 mg by mouth daily.   Vitamin E 100 units TABS Take 100 Units by mouth daily.    Zinc 50 MG CAPS Take 50 mg by mouth daily.   Allergies  Allergen Reactions   Bee Venom     Swelling, shortness of breathe   Shellfish Allergy Other (See Comments)    Gout, not allergy, patient doesn't eat  because it causes gout   Penicillins Rash and Other (See Comments)    Has patient had a PCN reaction causing immediate rash, facial/tongue/throat swelling, SOB or lightheadedness with hypotension: Yes Has patient had a PCN reaction causing severe rash involving mucus membranes or skin necrosis: No Has patient had a PCN reaction that required hospitalization No Has patient had a PCN reaction occurring within the last 10 years: No If all of the above answers are "NO", then may proceed with Cephalosporin use.    Past Medical History:  Diagnosis Date   Cancer ([place])    skin cancer removed from leg and head   COPD (chronic obstructive pulmonary disease) ([place])    Coronary artery disease    "a couple blockages on each side of my heart" pt reports this but has not sure if he has had a cath   DVT (deep venous thrombosis) ([place]) 02/05/2016   Gout    Hypercholesteremia    Hypertension    PE (pulmonary embolism) 02/05/2016   Scoliosis    Family History  Problem Relation Age of Onset   Heart disease Mother    Heart disease Father    Cystic fibrosis Paternal Aunt    Past  Surgical History:  Procedure Laterality Date   ABDOMINAL AORTOGRAM W/LOWER EXTREMITY N/A 09/04/2017   Procedure: ABDOMINAL AORTOGRAM W/LOWER EXTREMITY;  Surgeon: Conrad Sterling, MD;  Location: [place] [facility];  Service: Cardiovascular;  Laterality: N/A;   AORTA - BILATERAL FEMORAL ARTERY BYPASS GRAFT Bilateral 10/14/2017   Procedure: AORTA BIFEMORAL BYPASS GRAFT;  Surgeon: Conrad Rawlins, MD;  Location: [place];  Service: Vascular;  Laterality: Bilateral;   FEMORAL-POPLITEAL BYPASS GRAFT Right 10/14/2017   Procedure: Right FEMORAL-POPLITEAL ARTERY Bypass graft using gortex graft;  Surgeon: Conrad Nyack, MD;  Location: [place];  Service: Vascular;  Laterality: Right;   FOOT SURGERY Left    "put my foot back together"    INTRAOPERATIVE ARTERIOGRAM Left 10/14/2017   Procedure: INTRA OPERATIVE ARTERIOGRAM;  Surgeon: Conrad Freeburn,  MD;  Location: [place];  Service: Vascular;  Laterality: Left;   NECK SURGERY     discs removed, steel plate   PERIPHERAL VASCULAR INTERVENTION Left 09/04/2017   Procedure: PERIPHERAL VASCULAR INTERVENTION;  Surgeon: Conrad [place], MD;  Location: [place] [facility];  Service: Cardiovascular;  Laterality: Left;  left common iliac   SKIN CANCER EXCISION     THROMBECTOMY FEMORAL ARTERY Left 10/14/2017   Procedure: THROMBECTOMY Left Lower leg;  Surgeon: Conrad Wamac, MD;  Location: [facility];  Service: Vascular;  Laterality: Left;   Social History   Socioeconomic History   Marital status: Divorced    Spouse name: Not on file   Number of children: Not on file   Years of education: Not on file   Highest education level: Not on file  Occupational History   Not on file  Tobacco Use   Smoking status: Former    Types: Cigarettes    Quit date: 08/31/2017    Years since quitting: 4.1   Smokeless tobacco: Never  Vaping Use   Vaping Use: Never used  Substance and Sexual Activity   Alcohol use: No   Drug use: No   Sexual activity: Not on file  Other Topics Concern   Not on file  Social History Narrative   Not on file   Social Determinants of Health   Financial Resource Strain: Not on file  Food Insecurity: Not on file  Transportation Needs: Not on file  Physical Activity: Not on file  Stress: Not on file  Social Connections: Not on file  Intimate Partner Violence: Not on file     Review of Systems:  All other systems reviewed and are otherwise negative except as noted above.   Physical Exam:  Blood pressure (!) 144/78, pulse 84, height 5\' 11"  (1.803 m), weight 188 lb (85.3 kg), SpO2 97 %.    HEENT:   NCAT   Neck:   JVP is normal  No bruits    Lungs are CTA  Cardiac exam:   RRR   Normal S1, S2   No murmurs Abd is supple  No hepatomegaly Ext are with Triv edema  EKG EKG is not done today     ASSESSMENT AND PLAN:   1  CAD   Pt has evid of CAD on CT scan   Myoview in 2017  without significant ischemia    I would recomm he continue current regimen    2   PAD  Follows with [facility]  (Early)  s/p aortobifem bypasses and R fem/pop bypass in 2018    3  HL  WOuld increase Crestor to 40 mg    4  HTN   BP is fair   Follow   5  COPD Breathing is steady    F/U in 7 months      Rosario Jacks, MHS [facility] 11/09/2021 12:58 PM

## 2021-11-09 NOTE — Patient Instructions (Signed)
Medication Instructions:   Increase Crestor to 40 mg Daily   *If you need a refill on your cardiac medications before your next appointment, please call your pharmacy*   Lab Work: Your physician recommends that you return for lab work in: [employer] March   If you have labs (blood work) drawn today and your tests are completely normal, you will receive your results only by: [employer] (if you have MyChart) OR A paper copy in the mail If you have any lab test that is abnormal or we need to change your treatment, we will call you to review the results.   Testing/Procedures: NONE    Follow-Up: At [facility], you and your health needs are our priority.  As part of our continuing mission to provide you with exceptional heart care, we have created designated Provider Care Teams.  These Care Teams include your primary Cardiologist (physician) and Advanced Practice Providers (APPs -  Physician Assistants and Nurse Practitioners) who all work together to provide you with the care you need, when you need it.  We recommend signing up for the patient portal called "MyChart".  Sign up information is provided on this After Visit Summary.  MyChart is used to connect with patients for Virtual Visits (Telemedicine).  Patients are able to view lab/test results, encounter notes, upcoming appointments, etc.  Non-urgent messages can be sent to your provider as well.   To learn more about what you can do with MyChart, go to [email or web address].    Your next appointment:   7 month(s)  The format for your next appointment:   In Person  Provider:   Dorris Carnes, MD    Other Instructions Thank you for choosing [place]!

## 2022-01-21 DIAGNOSIS — E782 Mixed hyperlipidemia: Secondary | ICD-10-CM | POA: Diagnosis not present

## 2022-01-21 DIAGNOSIS — R7301 Impaired fasting glucose: Secondary | ICD-10-CM | POA: Diagnosis not present

## 2022-01-21 DIAGNOSIS — Z125 Encounter for screening for malignant neoplasm of prostate: Secondary | ICD-10-CM | POA: Diagnosis not present

## 2022-01-23 DIAGNOSIS — Z9862 Peripheral vascular angioplasty status: Secondary | ICD-10-CM | POA: Diagnosis not present

## 2022-01-23 DIAGNOSIS — I739 Peripheral vascular disease, unspecified: Secondary | ICD-10-CM | POA: Diagnosis not present

## 2022-01-23 DIAGNOSIS — Z86010 Personal history of colonic polyps: Secondary | ICD-10-CM | POA: Diagnosis not present

## 2022-01-23 DIAGNOSIS — E782 Mixed hyperlipidemia: Secondary | ICD-10-CM | POA: Diagnosis not present

## 2022-01-23 DIAGNOSIS — I10 Essential (primary) hypertension: Secondary | ICD-10-CM | POA: Diagnosis not present

## 2022-01-23 DIAGNOSIS — N1831 Chronic kidney disease, stage 3a: Secondary | ICD-10-CM | POA: Diagnosis not present

## 2022-01-23 DIAGNOSIS — R7301 Impaired fasting glucose: Secondary | ICD-10-CM | POA: Diagnosis not present

## 2022-01-23 DIAGNOSIS — M1A9XX1 Chronic gout, unspecified, with tophus (tophi): Secondary | ICD-10-CM | POA: Diagnosis not present

## 2022-01-23 DIAGNOSIS — L97509 Non-pressure chronic ulcer of other part of unspecified foot with unspecified severity: Secondary | ICD-10-CM | POA: Diagnosis not present

## 2022-01-23 DIAGNOSIS — L409 Psoriasis, unspecified: Secondary | ICD-10-CM | POA: Diagnosis not present

## 2022-01-23 DIAGNOSIS — E875 Hyperkalemia: Secondary | ICD-10-CM | POA: Diagnosis not present

## 2022-01-23 DIAGNOSIS — Z125 Encounter for screening for malignant neoplasm of prostate: Secondary | ICD-10-CM | POA: Diagnosis not present

## 2022-01-25 ENCOUNTER — Encounter (INDEPENDENT_AMBULATORY_CARE_PROVIDER_SITE_OTHER): Payer: Self-pay | Admitting: *Deleted

## 2022-06-09 NOTE — Progress Notes (Signed)
06/10/2022 JAYMARION TROMBLY   20-Dec-1950  [id number]  Primary Physician Celene Squibb, MD Primary Cardiologist: Dr. Harrington Challenger  F/u of CAD    HPI: Patient si a 71 yo with a history of PVOD, COPD, PE and coronary calcifications  He had a GXT Myovue in 2017  Suspicious for variable soft tissue attenuation vs small region of apical ischemia   LVEF 70%  2D echo on 06/11/17 showed normal LVEF at 60-65% with normal wall motion without regional abnormalities. There was mild AI. The aortic root and ascending aorta were normal in size.  I saw the pt in Dec 2022  Since then he says he got really sick in May   Tired   Stayed in bed    No energy   Seen by [place] he had a pneumonia     Recovered  The pt denies CP   Breathing is fair/ stable   has trouble on smoky days    Has not seen T Early in over a year     Current Meds  Medication Sig   allopurinol (ZYLOPRIM) 300 MG tablet Take 300 mg by mouth daily.    aspirin EC 81 MG tablet Take 81 mg by mouth daily.   clindamycin (CLEOCIN) 150 MG capsule Take 150 mg by mouth as directed. Before dental appointments   co-enzyme Q-10 30 MG capsule Take 100 mg by mouth daily.    Cyanocobalamin (VITAMIN B 12 PO) Take 500 mg by mouth daily.   enalapril (VASOTEC) 5 MG tablet Take 1 tablet (5 mg total) by mouth daily.   guaiFENesin (MUCUS RELIEF PO) Take by mouth as directed.   hydrochlorothiazide (MICROZIDE) 12.5 MG capsule Take 1 capsule (12.5 mg total) by mouth daily.   MAGNESIUM PO Take 1 tablet by mouth as directed.   nitroGLYCERIN (NITROSTAT) 0.4 MG SL tablet Place 0.4 mg under the tongue every 5 (five) minutes as needed for chest pain. Max 3 doses in 15 minutes   Omega-3 Fatty Acids (FISH OIL) 1000 MG CAPS Take 1,000 mg by mouth daily.   rosuvastatin (CRESTOR) 40 MG tablet Take 1 tablet (40 mg total) by mouth daily.   vitamin C (ASCORBIC ACID) 500 MG tablet Take 500 mg by mouth daily.   Vitamin E 100 units TABS Take 100 Units by mouth daily.    Zinc  50 MG CAPS Take 50 mg by mouth daily.   Allergies  Allergen Reactions   Bee Venom     Swelling, shortness of breathe   Shellfish Allergy Other (See Comments)    Gout, not allergy, patient doesn't eat because it causes gout   Penicillins Rash and Other (See Comments)    Has patient had a PCN reaction causing immediate rash, facial/tongue/throat swelling, SOB or lightheadedness with hypotension: Yes Has patient had a PCN reaction causing severe rash involving mucus membranes or skin necrosis: No Has patient had a PCN reaction that required hospitalization No Has patient had a PCN reaction occurring within the last 10 years: No If all of the above answers are "NO", then may proceed with Cephalosporin use.    Past Medical History:  Diagnosis Date   Cancer ([place])    skin cancer removed from leg and head   COPD (chronic obstructive pulmonary disease) ([place])    Coronary artery disease    "a couple blockages on each side of my heart" pt reports this but has not sure if he has had a cath  DVT (deep venous thrombosis) ([place]) 02/05/2016   Gout    Hypercholesteremia    Hypertension    PE (pulmonary embolism) 02/05/2016   Scoliosis    Family History  Problem Relation Age of Onset   Heart disease Mother    Heart disease Father    Cystic fibrosis Paternal Aunt    Past Surgical History:  Procedure Laterality Date   ABDOMINAL AORTOGRAM W/LOWER EXTREMITY N/A 09/04/2017   Procedure: ABDOMINAL AORTOGRAM W/LOWER EXTREMITY;  Surgeon: Conrad Baraboo, MD;  Location: [facility];  Service: Cardiovascular;  Laterality: N/A;   AORTA - BILATERAL FEMORAL ARTERY BYPASS GRAFT Bilateral 10/14/2017   Procedure: AORTA BIFEMORAL BYPASS GRAFT;  Surgeon: Conrad Taft, MD;  Location: [place];  Service: Vascular;  Laterality: Bilateral;   FEMORAL-POPLITEAL BYPASS GRAFT Right 10/14/2017   Procedure: Right FEMORAL-POPLITEAL ARTERY Bypass graft using gortex graft;  Surgeon: Conrad Widener, MD;  Location: [facility];   Service: Vascular;  Laterality: Right;   FOOT SURGERY Left    "put my foot back together"    INTRAOPERATIVE ARTERIOGRAM Left 10/14/2017   Procedure: INTRA OPERATIVE ARTERIOGRAM;  Surgeon: Conrad [place]ville, MD;  Location: [facility];  Service: Vascular;  Laterality: Left;   NECK SURGERY     discs removed, steel plate   PERIPHERAL VASCULAR INTERVENTION Left 09/04/2017   Procedure: PERIPHERAL VASCULAR INTERVENTION;  Surgeon: Conrad Richland, MD;  Location: [facility];  Service: Cardiovascular;  Laterality: Left;  left common iliac   SKIN CANCER EXCISION     THROMBECTOMY FEMORAL ARTERY Left 10/14/2017   Procedure: THROMBECTOMY Left Lower leg;  Surgeon: Conrad Quantico, MD;  [facility];  Service: Vascular;  Laterality: Left;   Social History   Socioeconomic History   Marital status: Divorced    Spouse name: Not on file   Number of children: Not on file   Years of education: Not on file   Highest education level: Not on file  Occupational History   Not on file  Tobacco Use   Smoking status: Former    Types: Cigarettes    Quit date: 08/31/2017    Years since quitting: 4.7   Smokeless tobacco: Never  Vaping Use   Vaping Use: Never used  Substance and Sexual Activity   Alcohol use: No   Drug use: No   Sexual activity: Not on file  Other Topics Concern   Not on file  Social History Narrative   Not on file   Social Determinants of Health   Financial Resource Strain: Not on file  Food Insecurity: Not on file  Transportation Needs: Not on file  Physical Activity: Not on file  Stress: Not on file  Social Connections: Not on file  Intimate Partner Violence: Not on file     Review of Systems:  All other systems reviewed and are otherwise negative except as noted above.   Physical Exam:  Blood pressure 120/62, pulse 75, height '5\' 11"'$  (1.803 m), weight 178 lb (80.7 kg), SpO2 97 %.    HEENT:   NCAT   Neck:   JVP is normal  No bruits    Lungs are CTA but with decreased  airflow   Cardiac exam:   RRR   Normal S1, S2   No murmurs Abd is supple  No hepatomegaly Ext No LE edema     EKG EKG shows   NSR 74 bpm   Incomp RBBB  ASSESSMENT AND PLAN:   1  CAD  Pt has evid of CAD on CT scan   Myoview in 2017 without significant ischemia   Pt without symptoms of angina     2   PAD  Follows with [facility]  ([facility])  s/p aortobifem bypasses and R fem/pop bypass in 2018    Will work to set up appt for return visit    3  HL  LDL 55  HDL 47   Trig 159   COntinue currnet meds  with Crestor      4  HTN  BP is well controlled  5   ? 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

## 2022-06-10 ENCOUNTER — Ambulatory Visit (INDEPENDENT_AMBULATORY_CARE_PROVIDER_SITE_OTHER): Payer: Medicare Other | Admitting: Internal Medicine

## 2022-06-10 ENCOUNTER — Encounter: Payer: Self-pay | Admitting: Internal Medicine

## 2022-06-10 VITALS — BP 120/62 | HR 75 | Ht 71.0 in | Wt 178.0 lb

## 2022-06-10 DIAGNOSIS — I739 Peripheral vascular disease, unspecified: Secondary | ICD-10-CM

## 2022-06-10 DIAGNOSIS — R0989 Other specified symptoms and signs involving the circulatory and respiratory systems: Secondary | ICD-10-CM | POA: Diagnosis not present

## 2022-06-10 DIAGNOSIS — I2510 Atherosclerotic heart disease of native coronary artery without angina pectoris: Secondary | ICD-10-CM | POA: Diagnosis not present

## 2022-06-10 LAB — BASIC METABOLIC PANEL
BUN/Creatinine Ratio: 15 (ref 10–24)
BUN: 20 mg/dL (ref 8–27)
CO2: 22 mmol/L (ref 20–29)
Calcium: 9.3 mg/dL (ref 8.6–10.2)
Chloride: 101 mmol/L (ref 96–106)
Creatinine, Ser: 1.35 mg/dL — ABNORMAL HIGH (ref 0.76–1.27)
Glucose: 94 mg/dL (ref 70–99)
Potassium: 5.5 mmol/L — ABNORMAL HIGH (ref 3.5–5.2)
Sodium: 137 mmol/L (ref 134–144)
eGFR: 56 mL/min/{1.73_m2} — ABNORMAL LOW (ref >59)

## 2022-06-10 LAB — URIC ACID: Uric Acid: 6.7 mg/dL (ref 3.8–8.4)

## 2022-06-10 NOTE — Patient Instructions (Addendum)
Medication Instructions:   *If you need a refill on your cardiac medications before your next appointment, please call your pharmacy*   Lab Work: Uric Acid and Bmet today  If you have labs (blood work) drawn today and your tests are completely normal, you will receive your results only by: [place] (if you have MyChart) OR A paper copy in the mail If you have any lab test that is abnormal or we need to change your treatment, we will call you to review the results.   Testing/Procedures: In [place] has requested that you have a carotid duplex. This test is an ultrasound of the carotid arteries in your neck. It looks at blood flow through these arteries that supply the brain with blood. Allow one hour for this exam. There are no restrictions or special instructions.    Follow-Up: At [facility], you and your health needs are our priority.  As part of our continuing mission to provide you with exceptional heart care, we have created designated Provider Care Teams.  These Care Teams include your primary Cardiologist (physician) and Advanced Practice Providers (APPs -  Physician Assistants and Nurse Practitioners) who all work together to provide you with the care you need, when you need it.  We recommend signing up for the patient portal called "MyChart".  Sign up information is provided on this After Visit Summary.  MyChart is used to connect with patients for Virtual Visits (Telemedicine).  Patients are able to view lab/test results, encounter notes, upcoming appointments, etc.  Non-urgent messages can be sent to your provider as well.   To learn more about what you can do with MyChart, go to [email or web address].    Your next appointment:   8 month(s)  The format for your next appointment:   In Person  Provider:   Dorris Carnes, MD     Other Instructions Appt with Curt Jews MD (overdue) for his PVD    Important Information About Sugar

## 2022-06-10 NOTE — Addendum Note (Signed)
Addended by: Stephani Police on: 06/10/2022 11:54 AM   Modules accepted: Orders

## 2022-06-11 ENCOUNTER — Telehealth: Payer: Self-pay

## 2022-06-11 ENCOUNTER — Other Ambulatory Visit: Payer: Self-pay

## 2022-06-11 DIAGNOSIS — E875 Hyperkalemia: Secondary | ICD-10-CM

## 2022-06-11 NOTE — Telephone Encounter (Signed)
Pt to have at [place] in [facility] 06/12/22.

## 2022-06-11 NOTE — Telephone Encounter (Signed)
-----   Message from Dorris Carnes V, MD sent at 06/10/2022  9:33 PM EDT ----- Kidney function is stable   Cr 1.35    POtassium is a little high   5.5    It has not been high before   I would repeat in before changing meds    ? If sample slightly hemolized

## 2022-06-12 ENCOUNTER — Other Ambulatory Visit: Payer: Self-pay | Admitting: *Deleted

## 2022-06-12 DIAGNOSIS — Z95828 Presence of other vascular implants and grafts: Secondary | ICD-10-CM

## 2022-06-12 DIAGNOSIS — I739 Peripheral vascular disease, unspecified: Secondary | ICD-10-CM

## 2022-06-12 DIAGNOSIS — E875 Hyperkalemia: Secondary | ICD-10-CM | POA: Diagnosis not present

## 2022-06-13 ENCOUNTER — Telehealth: Payer: Self-pay | Admitting: Internal Medicine

## 2022-06-13 LAB — BASIC METABOLIC PANEL
BUN/Creatinine Ratio: 18 (ref 10–24)
BUN: 21 mg/dL (ref 8–27)
CO2: 21 mmol/L (ref 20–29)
Calcium: 9.4 mg/dL (ref 8.6–10.2)
Chloride: 102 mmol/L (ref 96–106)
Creatinine, Ser: 1.17 mg/dL (ref 0.76–1.27)
Glucose: 94 mg/dL (ref 70–99)
Potassium: 5.2 mmol/L (ref 3.5–5.2)
Sodium: 138 mmol/L (ref 134–144)
eGFR: 67 mL/min/{1.73_m2} (ref >59)

## 2022-06-13 MED ORDER — AMLODIPINE BESYLATE 2.5 MG PO TABS: 2.5000 mg | ORAL_TABLET | Freq: Every day | ORAL | 3 refills | Status: DC

## 2022-06-13 NOTE — Telephone Encounter (Signed)
The patient has been notified of the result and verbalized understanding.  All questions (if any) were answered.  Pt will start with the Amlodipine 2.5 mg and let us know how he is doing.

## 2022-06-13 NOTE — Telephone Encounter (Signed)
-----   Message from Fay Records, MD sent at 06/13/2022 10:34 AM EDT ----- Potassium is upper normal 5.2.  He is only on enalopril 5 mg.   I think it may be better to switch to 2.5 or 5 of amlodipine.   His K is probably high at times on current meds.     Start with 2.5 mg.   Follow BP.

## 2022-06-13 NOTE — Telephone Encounter (Signed)
Patient was returning call. Please advise ?

## 2022-06-17 ENCOUNTER — Ambulatory Visit (HOSPITAL_COMMUNITY)
Admission: RE | Admit: 2022-06-17 | Discharge: 2022-06-17 | Disposition: A | Discharge status: Home / Self Care | Payer: Medicare Other | Source: Ambulatory Visit | Attending: Internal Medicine | Admitting: Internal Medicine

## 2022-06-17 DIAGNOSIS — R0989 Other specified symptoms and signs involving the circulatory and respiratory systems: Secondary | ICD-10-CM | POA: Diagnosis not present

## 2022-06-17 DIAGNOSIS — I10 Essential (primary) hypertension: Secondary | ICD-10-CM | POA: Diagnosis not present

## 2022-06-17 DIAGNOSIS — I6523 Occlusion and stenosis of bilateral carotid arteries: Secondary | ICD-10-CM | POA: Diagnosis not present

## 2022-06-17 DIAGNOSIS — E782 Mixed hyperlipidemia: Secondary | ICD-10-CM | POA: Diagnosis not present

## 2022-06-18 ENCOUNTER — Telehealth: Payer: Self-pay

## 2022-06-18 NOTE — Telephone Encounter (Signed)
I would recomm taking 2.5 bid for now     Follow BP If good can switch to 5 daily

## 2022-06-18 NOTE — Telephone Encounter (Signed)
Called the pt to check on his BP since starting the Amlodipine 2.5 mg.. he has been taking it every night around dinner time... his readings have been up and down but he checks it sporadically.   7/28  11am 161/74  2pm 146/75  8pm 150/68  7/29 1 pm 144/75 8:30 pm 120/58  7/31 5:30pm 152/78 7pm 150/70 8:30 pm 126/71   He will try taking his Amlodipine 2.5 mg daily after he gets up in the morning instead of in the evening... he will then check his BP late morning and again later in the evening and write the readings down and call us with the readings.   Will forward to Dr Harrington Challenger to see if she wants to go ahead and increase to 5 mg or wait to see if altering the time helps to make a difference.

## 2022-06-19 MED ORDER — AMLODIPINE BESYLATE 2.5 MG PO TABS: 2.5000 mg | ORAL_TABLET | Freq: Two times a day (BID) | ORAL | 3 refills | Status: DC

## 2022-06-19 NOTE — Telephone Encounter (Signed)
Pt advised and will call when he is ready for refills... he has plenty on hand and asked that I do not send refills until he is ready.   He will monitor his BP and let us know how he is doing.

## 2022-06-19 NOTE — Addendum Note (Signed)
Addended by: Stephani Police on: 06/19/2022 08:41 AM   Modules accepted: Orders

## 2022-06-19 NOTE — Telephone Encounter (Signed)
Left a message for the pt to call back.  

## 2022-07-03 ENCOUNTER — Ambulatory Visit (INDEPENDENT_AMBULATORY_CARE_PROVIDER_SITE_OTHER): Payer: Medicare Other

## 2022-07-03 ENCOUNTER — Encounter: Payer: Self-pay | Admitting: Vascular Surgery

## 2022-07-03 ENCOUNTER — Ambulatory Visit (INDEPENDENT_AMBULATORY_CARE_PROVIDER_SITE_OTHER): Payer: Medicare Other | Admitting: Vascular Surgery

## 2022-07-03 VITALS — BP 152/71 | HR 81 | Temp 98.2°F | Resp 16 | Ht 71.0 in | Wt 178.0 lb

## 2022-07-03 DIAGNOSIS — I739 Peripheral vascular disease, unspecified: Secondary | ICD-10-CM

## 2022-07-03 DIAGNOSIS — I2510 Atherosclerotic heart disease of native coronary artery without angina pectoris: Secondary | ICD-10-CM | POA: Diagnosis not present

## 2022-07-03 DIAGNOSIS — Z95828 Presence of other vascular implants and grafts: Secondary | ICD-10-CM | POA: Diagnosis not present

## 2022-07-03 NOTE — Progress Notes (Signed)
[facility] [place]  Patient name: Dwayne Cuevas MRN: [id number] DOB: 1951/04/22 Sex: male  REASON FOR VISIT: Follow-up peripheral vascular disease  HPI: Dwayne Cuevas is a 71 y.o. male today for follow-up.  He is status post aortobifemoral and right femoral-popliteal bypass with Dr. [name] in 2018.  He had exploration of his below-knee popliteal at the same setting but no bypass.  His right femoral-popliteal bypass is propatent [employer]-Tex he reports no claudication type symptoms.  He does have some unsteady gait and walks with a cane when he is in public and not at home.  Past Medical History:  Diagnosis Date   Cancer ([place])    skin cancer removed from leg and head   COPD (chronic obstructive pulmonary disease) ([place])    Coronary artery disease    "a couple blockages on each side of my heart" pt reports this but has not sure if he has had a cath   DVT (deep venous thrombosis) ([place]) 02/05/2016   Gout    Hypercholesteremia    Hypertension    PE (pulmonary embolism) 02/05/2016   Scoliosis     Family History  Problem Relation Age of Onset   Heart disease Mother    Heart disease Father    Cystic fibrosis Paternal Aunt     SOCIAL HISTORY: Social History   Tobacco Use   Smoking status: Former    Types: Cigarettes    Quit date: 08/31/2017    Years since quitting: 4.8   Smokeless tobacco: Never  Substance Use Topics   Alcohol use: No    Allergies  Allergen Reactions   Bee Venom     Swelling, shortness of breathe   Shellfish Allergy Other (See Comments)    Gout, not allergy, patient doesn't eat because it causes gout   Penicillins Rash and Other (See Comments)    Has patient had a PCN reaction causing immediate rash, facial/tongue/throat swelling, SOB or lightheadedness with hypotension: Yes Has patient had a PCN reaction causing severe rash involving mucus membranes or skin necrosis: No Has patient had a PCN reaction  that required hospitalization No Has patient had a PCN reaction occurring within the last 10 years: No If all of the above answers are "NO", then may proceed with Cephalosporin use.     Current Outpatient Medications  Medication Sig Dispense Refill   allopurinol (ZYLOPRIM) 300 MG tablet Take 300 mg by mouth daily.      amLODipine (NORVASC) 2.5 MG tablet Take 1 tablet (2.5 mg total) by mouth 2 (two) times daily. 180 tablet 3   aspirin EC 81 MG tablet Take 81 mg by mouth daily.     clindamycin (CLEOCIN) 150 MG capsule Take 150 mg by mouth as directed. Before dental appointments  3   co-enzyme Q-10 30 MG capsule Take 100 mg by mouth daily.      Cyanocobalamin (VITAMIN B 12 PO) Take 500 mg by mouth daily.     guaiFENesin (MUCUS RELIEF PO) Take by mouth as directed.     hydrochlorothiazide (MICROZIDE) 12.5 MG capsule Take 1 capsule (12.5 mg total) by mouth daily. 90 capsule 3   MAGNESIUM PO Take 1 tablet by mouth as directed.     nitroGLYCERIN (NITROSTAT) 0.4 MG SL tablet Place 0.4 mg under the tongue every 5 (five) minutes as needed for chest pain. Max 3 doses in 15 minutes     Omega-3 Fatty Acids (FISH OIL) 1000 MG CAPS Take 1,000 mg by  mouth daily.     rosuvastatin (CRESTOR) 40 MG tablet Take 1 tablet (40 mg total) by mouth daily. 90 tablet 3   vitamin C (ASCORBIC ACID) 500 MG tablet Take 500 mg by mouth daily.     Vitamin E 100 units TABS Take 100 Units by mouth daily.      Zinc 50 MG CAPS Take 50 mg by mouth daily.     No current facility-administered medications for this visit.    REVIEW OF SYSTEMS:  '[X]'$  denotes positive finding, '[ ]'$  denotes negative finding Cardiac  Comments:  Chest pain or chest pressure:    Shortness of breath upon exertion:    Short of breath when lying flat:    Irregular heart rhythm:        Vascular    Pain in calf, thigh, or hip brought on by ambulation:    Pain in feet at night that wakes you up from your sleep:     Blood clot in your veins:    Leg  swelling:           PHYSICAL EXAM: Vitals:   07/03/22 1452  BP: (!) 152/71  Pulse: 81  Resp: 16  Temp: 98.2 F (36.8 C)  TempSrc: Temporal  SpO2: 98%  Weight: 178 lb (80.7 kg)  Height: '5\' 11"'$  (1.803 m)    GENERAL: The patient is a well-nourished male, in no acute distress. The vital signs are documented above. CARDIOVASCULAR: 2+ radial and 2+ femoral pulses bilaterally.  I do palpate a right popliteal pulse and no pulses on the left PULMONARY: There is good air exchange  MUSCULOSKELETAL: There are no major deformities or cyanosis. NEUROLOGIC: No focal weakness or paresthesias are detected. SKIN: There are no ulcers or rashes noted. PSYCHIATRIC: The patient has a normal affect.  DATA:  Duplex today shows biphasic flow throughout the right femoral to popliteal bypass.  There are some elevated velocities in the distal anastomosis which is unchanged from 1 year ago  Ankle arm index is 0.85 bilaterally  MEDICAL ISSUES: Stable follow-up.  We will continue his usual activities.  I again discussed symptoms of graft occlusion.  He will notify us should this occur.  Otherwise we will see him again in 1 year with duplex and ABI    Rosetta Posner, MD FACS [facility] [phone]  Note: Portions of this report may have been transcribed using voice recognition software.  Every effort has been made to ensure accuracy; however, inadvertent computerized transcription errors may still be present.

## 2022-07-23 DIAGNOSIS — R7301 Impaired fasting glucose: Secondary | ICD-10-CM | POA: Diagnosis not present

## 2022-07-23 DIAGNOSIS — Z125 Encounter for screening for malignant neoplasm of prostate: Secondary | ICD-10-CM | POA: Diagnosis not present

## 2022-07-23 DIAGNOSIS — M1A9XX1 Chronic gout, unspecified, with tophus (tophi): Secondary | ICD-10-CM | POA: Diagnosis not present

## 2022-07-23 DIAGNOSIS — I10 Essential (primary) hypertension: Secondary | ICD-10-CM | POA: Diagnosis not present

## 2022-07-26 ENCOUNTER — Encounter (INDEPENDENT_AMBULATORY_CARE_PROVIDER_SITE_OTHER): Payer: Self-pay | Admitting: *Deleted

## 2022-07-26 DIAGNOSIS — Z23 Encounter for immunization: Secondary | ICD-10-CM | POA: Diagnosis not present

## 2022-07-26 DIAGNOSIS — N1831 Chronic kidney disease, stage 3a: Secondary | ICD-10-CM | POA: Diagnosis not present

## 2022-07-26 DIAGNOSIS — E782 Mixed hyperlipidemia: Secondary | ICD-10-CM | POA: Diagnosis not present

## 2022-07-26 DIAGNOSIS — M1A9XX1 Chronic gout, unspecified, with tophus (tophi): Secondary | ICD-10-CM | POA: Diagnosis not present

## 2022-07-26 DIAGNOSIS — Z0001 Encounter for general adult medical examination with abnormal findings: Secondary | ICD-10-CM | POA: Diagnosis not present

## 2022-07-26 DIAGNOSIS — I739 Peripheral vascular disease, unspecified: Secondary | ICD-10-CM | POA: Diagnosis not present

## 2022-07-26 DIAGNOSIS — L409 Psoriasis, unspecified: Secondary | ICD-10-CM | POA: Diagnosis not present

## 2022-07-26 DIAGNOSIS — Z9862 Peripheral vascular angioplasty status: Secondary | ICD-10-CM | POA: Diagnosis not present

## 2022-07-26 DIAGNOSIS — Z86010 Personal history of colonic polyps: Secondary | ICD-10-CM | POA: Diagnosis not present

## 2022-07-26 DIAGNOSIS — L97509 Non-pressure chronic ulcer of other part of unspecified foot with unspecified severity: Secondary | ICD-10-CM | POA: Diagnosis not present

## 2022-07-26 DIAGNOSIS — Z125 Encounter for screening for malignant neoplasm of prostate: Secondary | ICD-10-CM | POA: Diagnosis not present

## 2022-07-26 DIAGNOSIS — I10 Essential (primary) hypertension: Secondary | ICD-10-CM | POA: Diagnosis not present

## 2022-07-30 ENCOUNTER — Telehealth: Payer: Self-pay | Admitting: *Deleted

## 2022-07-30 NOTE — Telephone Encounter (Signed)
Did not need this encounter °

## 2022-07-30 NOTE — Telephone Encounter (Signed)
Follow Up:    Patient is returning your call from today.

## 2022-07-31 ENCOUNTER — Other Ambulatory Visit: Payer: Self-pay

## 2022-07-31 ENCOUNTER — Telehealth: Payer: Self-pay

## 2022-07-31 ENCOUNTER — Encounter (INDEPENDENT_AMBULATORY_CARE_PROVIDER_SITE_OTHER): Payer: Self-pay | Admitting: *Deleted

## 2022-07-31 MED ORDER — AMLODIPINE BESYLATE 5 MG PO TABS: 5.0000 mg | ORAL_TABLET | Freq: Every day | ORAL | 3 refills | Status: DC

## 2022-07-31 MED ORDER — HYDROCHLOROTHIAZIDE 12.5 MG PO CAPS: 12.5000 mg | ORAL_CAPSULE | Freq: Every day | ORAL | 3 refills | Status: DC

## 2022-07-31 NOTE — Telephone Encounter (Signed)
Spoke with the pt to follow up with him re: his BP... he called me but no phone note was sent to me... he says he has been feeling well and his BP has been staying around 136/68.... he [place] asking to have his refills sent in for HCTZ and Amlodipine 5 mg and not 2.5 mg BID... I asked him to keep track of his BP to be sure the bottom number was not dropping too low an d he will call me in several days with some readings for Dr Harrington Challenger to review.

## 2022-08-19 ENCOUNTER — Telehealth: Payer: Self-pay | Admitting: Internal Medicine

## 2022-08-19 ENCOUNTER — Other Ambulatory Visit: Payer: Self-pay

## 2022-08-19 DIAGNOSIS — Z79899 Other long term (current) drug therapy: Secondary | ICD-10-CM

## 2022-08-19 DIAGNOSIS — I2510 Atherosclerotic heart disease of native coronary artery without angina pectoris: Secondary | ICD-10-CM

## 2022-08-19 DIAGNOSIS — M109 Gout, unspecified: Secondary | ICD-10-CM

## 2022-08-19 MED ORDER — HYDROCHLOROTHIAZIDE 12.5 MG PO CAPS: 12.5000 mg | ORAL_CAPSULE | Freq: Every day | ORAL | 3 refills | Status: DC

## 2022-08-19 NOTE — Telephone Encounter (Signed)
I spoke with the pt and he asked for his HCTZ to be refilled... he says he is having gout flare up..  in both of his knees and has been having it ion his elbow... he is asking for colchicine to be sent in... I asked if he had called his PCP Dr [place] Crane and he says he has a hard time getting someone to follow up with him... I will forward to Dr Harrington Challenger and to his PCP Dr Nevada Crane for a possible order.

## 2022-08-19 NOTE — Telephone Encounter (Signed)
Pt states he is returning call for Lelon Frohlich, RN from last week. Requesting call back.

## 2022-08-20 MED ORDER — COLCHICINE 0.6 MG PO TABS: 0.6000 mg | ORAL_TABLET | Freq: Two times a day (BID) | ORAL | 3 refills | Status: DC

## 2022-08-20 NOTE — Telephone Encounter (Signed)
Pt advised Dr Harrington Challenger' note and will have labs in [place] in 10 days.

## 2022-08-20 NOTE — Telephone Encounter (Signed)
OK to Rx with colchicine 0.6 bid  Get BMET and uric acid in 10 days Is he still taking allopurinol?

## 2022-08-30 DIAGNOSIS — I2510 Atherosclerotic heart disease of native coronary artery without angina pectoris: Secondary | ICD-10-CM | POA: Diagnosis not present

## 2022-08-30 DIAGNOSIS — Z79899 Other long term (current) drug therapy: Secondary | ICD-10-CM | POA: Diagnosis not present

## 2022-08-30 DIAGNOSIS — M109 Gout, unspecified: Secondary | ICD-10-CM | POA: Diagnosis not present

## 2022-08-31 LAB — BASIC METABOLIC PANEL
BUN/Creatinine Ratio: 14 (ref 10–24)
BUN: 22 mg/dL (ref 8–27)
CO2: 24 mmol/L (ref 20–29)
Calcium: 10 mg/dL (ref 8.6–10.2)
Chloride: 100 mmol/L (ref 96–106)
Creatinine, Ser: 1.58 mg/dL — ABNORMAL HIGH (ref 0.76–1.27)
Glucose: 87 mg/dL (ref 70–99)
Potassium: 4.6 mmol/L (ref 3.5–5.2)
Sodium: 140 mmol/L (ref 134–144)
eGFR: 47 mL/min/{1.73_m2} — ABNORMAL LOW (ref >59)

## 2022-08-31 LAB — URIC ACID: Uric Acid: 4.3 mg/dL (ref 3.8–8.4)

## 2022-09-05 ENCOUNTER — Telehealth: Payer: Self-pay

## 2022-09-05 DIAGNOSIS — Z79899 Other long term (current) drug therapy: Secondary | ICD-10-CM

## 2022-09-05 DIAGNOSIS — M109 Gout, unspecified: Secondary | ICD-10-CM

## 2022-09-05 MED ORDER — COLCHICINE 0.6 MG PO TABS: 0.6000 mg | ORAL_TABLET | Freq: Two times a day (BID) | ORAL | 3 refills | Status: DC

## 2022-09-05 NOTE — Telephone Encounter (Signed)
Pt advised and will have repeat labs 09/17/22. He is off the Colchicine.

## 2022-09-05 NOTE — Telephone Encounter (Signed)
-----   Message from [place], MD sent at 09/04/2022  9:43 PM EDT ----- Uric acid is normal  BMET:  Cr is  little high    Stop colchicine Follow BP   Stay hydrated  Repeat BMET in 10 days

## 2022-09-17 ENCOUNTER — Other Ambulatory Visit: Payer: Self-pay | Admitting: *Deleted

## 2022-09-17 DIAGNOSIS — M109 Gout, unspecified: Secondary | ICD-10-CM

## 2022-09-17 DIAGNOSIS — Z79899 Other long term (current) drug therapy: Secondary | ICD-10-CM

## 2023-01-17 ENCOUNTER — Encounter (INDEPENDENT_AMBULATORY_CARE_PROVIDER_SITE_OTHER): Payer: Self-pay | Admitting: *Deleted

## 2023-01-21 DIAGNOSIS — R7301 Impaired fasting glucose: Secondary | ICD-10-CM | POA: Diagnosis not present

## 2023-01-21 DIAGNOSIS — I10 Essential (primary) hypertension: Secondary | ICD-10-CM | POA: Diagnosis not present

## 2023-01-21 DIAGNOSIS — M1A9XX1 Chronic gout, unspecified, with tophus (tophi): Secondary | ICD-10-CM | POA: Diagnosis not present

## 2023-01-21 DIAGNOSIS — Z125 Encounter for screening for malignant neoplasm of prostate: Secondary | ICD-10-CM | POA: Diagnosis not present

## 2023-01-27 DIAGNOSIS — E875 Hyperkalemia: Secondary | ICD-10-CM | POA: Diagnosis not present

## 2023-01-27 DIAGNOSIS — Z86010 Personal history of colonic polyps: Secondary | ICD-10-CM | POA: Diagnosis not present

## 2023-01-27 DIAGNOSIS — Z9862 Peripheral vascular angioplasty status: Secondary | ICD-10-CM | POA: Diagnosis not present

## 2023-01-27 DIAGNOSIS — M1A9XX1 Chronic gout, unspecified, with tophus (tophi): Secondary | ICD-10-CM | POA: Diagnosis not present

## 2023-01-27 DIAGNOSIS — I739 Peripheral vascular disease, unspecified: Secondary | ICD-10-CM | POA: Diagnosis not present

## 2023-01-27 DIAGNOSIS — R7301 Impaired fasting glucose: Secondary | ICD-10-CM | POA: Diagnosis not present

## 2023-01-27 DIAGNOSIS — E782 Mixed hyperlipidemia: Secondary | ICD-10-CM | POA: Diagnosis not present

## 2023-01-27 DIAGNOSIS — I10 Essential (primary) hypertension: Secondary | ICD-10-CM | POA: Diagnosis not present

## 2023-01-27 DIAGNOSIS — N1831 Chronic kidney disease, stage 3a: Secondary | ICD-10-CM | POA: Diagnosis not present

## 2023-01-27 DIAGNOSIS — L409 Psoriasis, unspecified: Secondary | ICD-10-CM | POA: Diagnosis not present

## 2023-01-27 DIAGNOSIS — Z872 Personal history of diseases of the skin and subcutaneous tissue: Secondary | ICD-10-CM | POA: Diagnosis not present

## 2023-01-27 DIAGNOSIS — I129 Hypertensive chronic kidney disease with stage 1 through stage 4 chronic kidney disease, or unspecified chronic kidney disease: Secondary | ICD-10-CM | POA: Diagnosis not present

## 2023-03-24 ENCOUNTER — Encounter: Payer: Self-pay | Admitting: Internal Medicine

## 2023-03-24 ENCOUNTER — Ambulatory Visit: Payer: Medicare Other | Attending: Internal Medicine | Admitting: Internal Medicine

## 2023-03-24 VITALS — BP 126/64 | HR 78 | Ht 71.0 in | Wt 178.6 lb

## 2023-03-24 DIAGNOSIS — I2510 Atherosclerotic heart disease of native coronary artery without angina pectoris: Secondary | ICD-10-CM | POA: Diagnosis not present

## 2023-03-24 MED ORDER — ROSUVASTATIN CALCIUM 40 MG PO TABS: 40.0000 mg | ORAL_TABLET | Freq: Every day | ORAL | 3 refills | Status: DC

## 2023-03-24 NOTE — Progress Notes (Signed)
03/24/2023 Dwayne Cuevas   11-21-1950  [id number]  Primary Physician Benita Stabile, MD Primary Cardiologist: Dr. Tenny Craw  F/u of CAD    HPI: Patient si a 72 yo with a history of PVOD, COPD, PE and coronary calcifications  GXT Myovue in 2017  Suspicious for variable soft tissue attenuation vs small region of apical ischemia   LVEF 70%  2D echo on 06/11/17 showed normal LVEF at 60-65% with normal wall motion without regional abnormalities. There was mild AI. The aortic root and ascending aorta were normal in size.  I saw the pt in clinic in July 2023     Since seen the pt denies CP   Breathing  Gets SOB with flight of stairs   Stable    Denies dizziness  No palpitations  Ran out of Crestor     Current Meds  Medication Sig   amLODipine (NORVASC) 5 MG tablet Take 1 tablet (5 mg total) by mouth daily.   aspirin EC 81 MG tablet Take 81 mg by mouth daily.   clindamycin (CLEOCIN) 150 MG capsule Take 150 mg by mouth as directed. Before dental appointments   co-enzyme Q-10 30 MG capsule Take 100 mg by mouth daily.    colchicine 0.6 MG tablet Take 1 tablet (0.6 mg total) by mouth 2 (two) times daily. ON HOLD   Cyanocobalamin (VITAMIN B 12 PO) Take 500 mg by mouth daily.   guaiFENesin (MUCUS RELIEF PO) Take by mouth as directed.   hydrochlorothiazide (MICROZIDE) 12.5 MG capsule Take 1 capsule (12.5 mg total) by mouth daily.   MAGNESIUM PO Take 1 tablet by mouth as directed.   nitroGLYCERIN (NITROSTAT) 0.4 MG SL tablet Place 0.4 mg under the tongue every 5 (five) minutes as needed for chest pain. Max 3 doses in 15 minutes   Omega-3 Fatty Acids (FISH OIL) 1000 MG CAPS Take 1,000 mg by mouth daily.   rosuvastatin (CRESTOR) 40 MG tablet Take  1 tablet (40 mg total) by mouth daily.   vitamin C (ASCORBIC ACID) 500 MG tablet Take 500 mg by mouth daily.   Vitamin E 100 units TABS Take 100 Units by mouth daily.    Zinc 50 MG CAPS Take 50 mg by mouth daily.   Allergies  Allergen Reactions   Bee Venom     Swelling, shortness of breathe   Shellfish Allergy Other (See Comments)    Gout, not allergy, patient doesn't eat because it causes gout   Penicillins Rash and Other (See Comments)    Has patient had a PCN reaction causing immediate rash, facial/tongue/throat swelling, SOB or lightheadedness with hypotension: Yes Has patient had a PCN reaction causing severe rash involving mucus membranes or skin  necrosis: No Has patient had a PCN reaction that required hospitalization No Has patient had a PCN reaction occurring within the last 10 years: No If all of the above answers are "NO", then may proceed with Cephalosporin use.    Past Medical History:  Diagnosis Date   Cancer (HCC)    skin cancer removed from leg and head   COPD (chronic obstructive pulmonary disease) (HCC)    Coronary artery disease    "a couple blockages on each side of my heart" pt reports this but has not sure if he has had a cath   DVT (deep venous thrombosis) (HCC) 02/05/2016   Gout    Hypercholesteremia    Hypertension    PE (pulmonary embolism) 02/05/2016   Scoliosis    Family History  Problem Relation Age of Onset   Heart disease Mother    Heart disease Father    Cystic fibrosis Paternal Aunt    Past Surgical History:  Procedure Laterality Date   ABDOMINAL AORTOGRAM W/LOWER EXTREMITY N/A 09/04/2017   Procedure: ABDOMINAL AORTOGRAM W/LOWER EXTREMITY;  Surgeon: Fransisco Hertz, MD;  Location: [facility] INVASIVE CV LAB;  Service: Cardiovascular;  Laterality: N/A;   AORTA - BILATERAL FEMORAL ARTERY BYPASS GRAFT Bilateral 10/14/2017   Procedure: AORTA BIFEMORAL BYPASS GRAFT;  Surgeon: Fransisco Hertz, MD;  Location: [facility] OR;  Service: Vascular;  Laterality: Bilateral;    FEMORAL-POPLITEAL BYPASS GRAFT Right 10/14/2017   Procedure: Right FEMORAL-POPLITEAL ARTERY Bypass graft using gortex graft;  Surgeon: Fransisco Hertz, MD;  Location: [facility] OR;  Service: Vascular;  Laterality: Right;   FOOT SURGERY Left    "put my foot back together"    INTRAOPERATIVE ARTERIOGRAM Left 10/14/2017   Procedure: INTRA OPERATIVE ARTERIOGRAM;  Surgeon: Fransisco Hertz, MD;  Location: [facility] OR;  Service: Vascular;  Laterality: Left;   NECK SURGERY     discs removed, steel plate   PERIPHERAL VASCULAR INTERVENTION Left 09/04/2017   Procedure: PERIPHERAL VASCULAR INTERVENTION;  Surgeon: Fransisco Hertz, MD;  Location: [facility] INVASIVE CV LAB;  Service: Cardiovascular;  Laterality: Left;  left common iliac   SKIN CANCER EXCISION     THROMBECTOMY FEMORAL ARTERY Left 10/14/2017   Procedure: THROMBECTOMY Left Lower leg;  Surgeon: Fransisco Hertz, MD;  Location: [facility] OR;  Service: Vascular;  Laterality: Left;   Social History   Socioeconomic History   Marital status: Divorced    Spouse name: Not on file   Number of children: Not on file   Years of education: Not on file   Highest education level: Not on file  Occupational History   Not on file  Tobacco Use   Smoking status: Former    Types: Cigarettes    Quit date: 08/31/2017    Years since quitting: 5.5   Smokeless tobacco: Never  Vaping Use   Vaping Use: Never used  Substance and Sexual Activity   Alcohol use: No   Drug use: No   Sexual activity: Not on file  Other Topics Concern   Not on file  Social History Narrative   Not on file   Social Determinants of Health   Financial Resource Strain: Not on file  Food Insecurity: Not on file  Transportation Needs: Not on file  Physical Activity: Not on file  Stress: Not on file  Social Connections: Not on file  Intimate Partner Violence: Not on file     Review of Systems:  All other systems reviewed and are otherwise negative except as noted above.  Physical Exam:  Blood pressure  126/64, pulse 78, height 5\' 11"  (1.803 m), weight 178 lb 9.6 oz (81 kg), SpO2 95 %.    HEENT:   NCAT   Neck:   JVP normal   No carotid bruits    Lungs are CTA but with decreased airflow   Cardiac exam:   RRR   Normal S1, S2   No murmur Abd   Supple  No hepatomegaly Ext No LE edema     EKG EKG not done   ASSESSMENT AND PLAN:   1  CAD   Pt has evid of CAD on CT scan   Myoview in 2017 without significant ischemia   Some dyspnea on exertion but stable   No CP     2   PAD  Follows with [facility].   s/p aortobifem bypasses and R fem/pop bypass in 2018    3  HL  LDL elevated at 119 on last check   Off Crestor   Will renew prescription   Had been under good control      4  HTN  BP is well controlled  5  CV dz  Mod dz on R ICA, mild L ICA    Continue statin   Will need to be followed     6  hx gout    Uric acid was elevated when last checked   He is asymptomatic        7  COPD  F/U next winter   Dwayne Cuevas [place], MHS [facility] 03/24/2023 2:22 PM

## 2023-03-24 NOTE — Patient Instructions (Signed)
Medication Instructions:  Your physician recommends that you continue on your current medications as directed. Please refer to the Current Medication list given to you today.  *If you need a refill on your cardiac medications before your next appointment, please call your pharmacy*   Lab Work: NONE   If you have labs (blood work) drawn today and your tests are completely normal, you will receive your results only by: MyChart Message (if you have MyChart) OR A paper copy in the mail If you have any lab test that is abnormal or we need to change your treatment, we will call you to review the results.   Testing/Procedures: NONE    Follow-Up: At [facility], you and your health needs are our priority.  As part of our continuing mission to provide you with exceptional heart care, we have created designated Provider Care Teams.  These Care Teams include your primary Cardiologist (physician) and Advanced Practice Providers (APPs -  Physician Assistants and Nurse Practitioners) who all work together to provide you with the care you need, when you need it.  We recommend signing up for the patient portal called "MyChart".  Sign up information is provided on this After Visit Summary.  MyChart is used to connect with patients for Virtual Visits (Telemedicine).  Patients are able to view lab/test results, encounter notes, upcoming appointments, etc.  Non-urgent messages can be sent to your provider as well.   To learn more about what you can do with MyChart, go to [email or web address].    Your next appointment:    January 2025   Provider:   Dietrich Pates, MD    Other Instructions Thank you for choosing [facility]! \

## 2023-04-07 ENCOUNTER — Ambulatory Visit: Payer: Medicare Other | Admitting: Internal Medicine

## 2023-07-22 ENCOUNTER — Other Ambulatory Visit: Payer: Self-pay

## 2023-07-22 MED ORDER — HYDROCHLOROTHIAZIDE 12.5 MG PO CAPS: 12.5000 mg | ORAL_CAPSULE | Freq: Every day | ORAL | 3 refills | Status: DC

## 2023-07-22 MED ORDER — AMLODIPINE BESYLATE 5 MG PO TABS: 5.0000 mg | ORAL_TABLET | Freq: Every day | ORAL | 3 refills | Status: DC

## 2023-07-24 DIAGNOSIS — I10 Essential (primary) hypertension: Secondary | ICD-10-CM | POA: Diagnosis not present

## 2023-07-24 DIAGNOSIS — M1A9XX1 Chronic gout, unspecified, with tophus (tophi): Secondary | ICD-10-CM | POA: Diagnosis not present

## 2023-07-24 DIAGNOSIS — Z125 Encounter for screening for malignant neoplasm of prostate: Secondary | ICD-10-CM | POA: Diagnosis not present

## 2023-07-24 DIAGNOSIS — R7301 Impaired fasting glucose: Secondary | ICD-10-CM | POA: Diagnosis not present

## 2023-07-30 DIAGNOSIS — I10 Essential (primary) hypertension: Secondary | ICD-10-CM | POA: Diagnosis not present

## 2023-07-30 DIAGNOSIS — Z86010 Personal history of colonic polyps: Secondary | ICD-10-CM | POA: Diagnosis not present

## 2023-07-30 DIAGNOSIS — I739 Peripheral vascular disease, unspecified: Secondary | ICD-10-CM | POA: Diagnosis not present

## 2023-07-30 DIAGNOSIS — Z23 Encounter for immunization: Secondary | ICD-10-CM | POA: Diagnosis not present

## 2023-07-30 DIAGNOSIS — Z0001 Encounter for general adult medical examination with abnormal findings: Secondary | ICD-10-CM | POA: Diagnosis not present

## 2023-07-30 DIAGNOSIS — J019 Acute sinusitis, unspecified: Secondary | ICD-10-CM | POA: Diagnosis not present

## 2023-07-30 DIAGNOSIS — M1A9XX1 Chronic gout, unspecified, with tophus (tophi): Secondary | ICD-10-CM | POA: Diagnosis not present

## 2023-07-30 DIAGNOSIS — N1831 Chronic kidney disease, stage 3a: Secondary | ICD-10-CM | POA: Diagnosis not present

## 2023-07-30 DIAGNOSIS — Z9862 Peripheral vascular angioplasty status: Secondary | ICD-10-CM | POA: Diagnosis not present

## 2023-07-30 DIAGNOSIS — E782 Mixed hyperlipidemia: Secondary | ICD-10-CM | POA: Diagnosis not present

## 2023-07-30 DIAGNOSIS — I129 Hypertensive chronic kidney disease with stage 1 through stage 4 chronic kidney disease, or unspecified chronic kidney disease: Secondary | ICD-10-CM | POA: Diagnosis not present

## 2023-07-30 DIAGNOSIS — L409 Psoriasis, unspecified: Secondary | ICD-10-CM | POA: Diagnosis not present

## 2024-01-12 ENCOUNTER — Ambulatory Visit: Payer: Medicare Other | Attending: Internal Medicine | Admitting: Internal Medicine

## 2024-01-12 ENCOUNTER — Encounter: Payer: Self-pay | Admitting: Internal Medicine

## 2024-01-12 VITALS — BP 130/62 | HR 80 | Ht 71.0 in | Wt 184.2 lb

## 2024-01-12 DIAGNOSIS — I739 Peripheral vascular disease, unspecified: Secondary | ICD-10-CM | POA: Diagnosis not present

## 2024-01-12 DIAGNOSIS — I10 Essential (primary) hypertension: Secondary | ICD-10-CM | POA: Insufficient documentation

## 2024-01-12 NOTE — Progress Notes (Signed)
 [name] Kitchen

## 2024-01-12 NOTE — Patient Instructions (Signed)
 Medication Instructions:  Your physician recommends that you continue on your current medications as directed. Please refer to the Current Medication list given to you today.  *If you need a refill on your cardiac medications before your next appointment, please call your pharmacy*   Lab Work: NONE   If you have labs (blood work) drawn today and your tests are completely normal, you will receive your results only by: MyChart Message (if you have MyChart) OR A paper copy in the mail If you have any lab test that is abnormal or we need to change your treatment, we will call you to review the results.   Testing/Procedures: NONE    Follow-Up: At [facility], you and your health needs are our priority.  As part of our continuing mission to provide you with exceptional heart care, we have created designated Provider Care Teams.  These Care Teams include your primary Cardiologist (physician) and Advanced Practice Providers (APPs -  Physician Assistants and Nurse Practitioners) who all work together to provide you with the care you need, when you need it.  We recommend signing up for the patient portal called "MyChart".  Sign up information is provided on this After Visit Summary.  MyChart is used to connect with patients for Virtual Visits (Telemedicine).  Patients are able to view lab/test results, encounter notes, upcoming appointments, etc.  Non-urgent messages can be sent to your provider as well.   To learn more about what you can do with MyChart, go to [email or web address].    Your next appointment:   9 month(s)  Provider:   You may see Dietrich Pates, MD or one of the following Advanced Practice Providers on your designated Care Team:   Randall An, PA-C  Jacolyn Reedy, PA-C     Other Instructions Thank you for choosing [facility]!

## 2024-01-12 NOTE — Progress Notes (Signed)
 01/12/2024 Dwayne Cuevas   1951/11/08  [id number]  Primary Physician Benita Stabile, MD Primary Cardiologist: Dr. Tenny Craw  F/u of CAD    HPI: Patient is a 73 yo  with a history of PVOD, COPD, PE and coronary calcifications  GXT Myovue in 2017  Suspicious for variable soft tissue attenuation vs small region of apical ischemia   LVEF 70%  2D echo on 06/11/17 showed normal LVEF at 60-65% with normal wall motion without regional abnormalities. There was mild AI. The aortic root and ascending aorta were normal in size.  I saw the [name]  Since seen he has done OK from a cardiac standpoint   Denies CP Breathing is ok  No dizziness Does have problems with gout in hand   Took colchicine for a short bit He does have some cramping in R leg with walking  He was previously followed by [facility]  Needs follow up in [facility]  Current Meds  Medication Sig   amLODipine (NORVASC) 5 MG tablet Take 1 tablet (5 mg total) by mouth daily.   aspirin EC 81 MG tablet Take 81 mg by mouth daily.   clindamycin (CLEOCIN) 150 MG capsule Take 150 mg by mouth as directed. Before dental appointments   co-enzyme Q-10 30 MG capsule Take 100 mg by mouth daily.    colchicine 0.6 MG tablet Take 1 tablet (0.6 mg total) by mouth 2 (two) times daily. ON HOLD   Cyanocobalamin (VITAMIN B 12 PO) Take 500 mg by mouth daily.   hydrochlorothiazide (MICROZIDE) 12.5 MG capsule Take 1 capsule (12.5 mg total) by mouth daily.   MAGNESIUM PO Take 1 tablet by mouth as directed.   nitroGLYCERIN (NITROSTAT) 0.4 MG SL tablet Place 0.4 mg under the tongue every 5 (five) minutes as needed for chest pain. Max 3 doses in 15 minutes   Omega-3 Fatty Acids (FISH OIL) 1000 MG CAPS Take 1,000 mg by mouth daily.   rosuvastatin (CRESTOR) 40 MG tablet Take 1 tablet (40 mg total) by mouth daily.   vitamin C (ASCORBIC ACID) 500 MG tablet Take 500 mg by mouth daily.   Vitamin E 100 units TABS Take 100 Units by mouth daily.    Zinc 50 MG CAPS Take  50 mg by mouth daily.   Allergies  Allergen Reactions   Bee Venom     Swelling, shortness of breathe   Shellfish Allergy Other (See Comments)    Gout, not allergy, patient doesn't eat because it causes gout   Penicillins Rash and Other (See Comments)    Has patient had a PCN reaction causing immediate rash, facial/tongue/throat swelling, SOB or lightheadedness with hypotension: Yes Has patient had a PCN reaction causing severe rash involving mucus membranes or skin necrosis: No Has patient had a PCN reaction that required hospitalization No Has patient had a PCN reaction occurring within the last 10 years: No If all of the above answers are "NO", then may proceed with Cephalosporin use.    Past Medical History:  Diagnosis Date   Cancer (HCC)    skin cancer removed from leg and head   COPD (chronic obstructive pulmonary disease) (HCC)    Coronary artery disease    "a couple blockages on each side of my heart" pt reports this but has not sure if he has had a cath   DVT (deep venous thrombosis) (HCC) 02/05/2016   Gout    Hypercholesteremia  Hypertension    PE (pulmonary embolism) 02/05/2016   Scoliosis    Family History  Problem Relation Age of Onset   Heart disease Mother    Heart disease Father    Cystic fibrosis Paternal Aunt    Past Surgical History:  Procedure Laterality Date   ABDOMINAL AORTOGRAM W/LOWER EXTREMITY N/A 09/04/2017   Procedure: ABDOMINAL AORTOGRAM W/LOWER EXTREMITY;  Surgeon: Fransisco Hertz, MD;  Location: [facility] INVASIVE CV LAB;  Service: Cardiovascular;  Laterality: N/A;   AORTA - BILATERAL FEMORAL ARTERY BYPASS GRAFT Bilateral 10/14/2017   Procedure: AORTA BIFEMORAL BYPASS GRAFT;  Surgeon: Fransisco Hertz, MD;  Location: [facility] OR;  Service: Vascular;  Laterality: Bilateral;   FEMORAL-POPLITEAL BYPASS GRAFT Right 10/14/2017   Procedure: Right FEMORAL-POPLITEAL ARTERY Bypass graft using gortex graft;  Surgeon: Fransisco Hertz, MD;  Location: [facility] OR;  Service: Vascular;   Laterality: Right;   FOOT SURGERY Left    "put my foot back together"    INTRAOPERATIVE ARTERIOGRAM Left 10/14/2017   Procedure: INTRA OPERATIVE ARTERIOGRAM;  Surgeon: Fransisco Hertz, MD;  Location: [facility] OR;  Service: Vascular;  Laterality: Left;   NECK SURGERY     discs removed, steel plate   PERIPHERAL VASCULAR INTERVENTION Left 09/04/2017   Procedure: PERIPHERAL VASCULAR INTERVENTION;  Surgeon: Fransisco Hertz, MD;  Location: [facility] INVASIVE CV LAB;  Service: Cardiovascular;  Laterality: Left;  left common iliac   SKIN CANCER EXCISION     THROMBECTOMY FEMORAL ARTERY Left 10/14/2017   Procedure: THROMBECTOMY Left Lower leg;  Surgeon: Fransisco Hertz, MD;  Location: [facility] OR;  Service: Vascular;  Laterality: Left;   Social History   Socioeconomic History   Marital status: Divorced    Spouse name: Not on file   Number of children: Not on file   Years of education: Not on file   Highest education level: Not on file  Occupational History   Not on file  Tobacco Use   Smoking status: Former    Current packs/day: 0.00    Types: Cigarettes    Quit date: 08/31/2017    Years since quitting: 6.3   Smokeless tobacco: Never  Vaping Use   Vaping status: Never Used  Substance and Sexual Activity   Alcohol use: No   Drug use: No   Sexual activity: Not on file  Other Topics Concern   Not on file  Social History Narrative   Not on file   Social Drivers of Health   Financial Resource Strain: Not on file  Food Insecurity: Not on file  Transportation Needs: Not on file  Physical Activity: Not on file  Stress: Not on file  Social Connections: Not on file  Intimate Partner Violence: Not on file     Review of Systems:  All other systems reviewed and are otherwise negative except as noted above.   Physical Exam:  Blood pressure 130/62, pulse 80, height 5\' 11"  (1.803 m), weight 184 lb 3.2 oz (83.6 kg), SpO2 97%.    Pt is in NAD  HEENT:   NCAT   Neck:   JVP not elevated    No carotid bruits     Lungs Moving air OK  Cardiac exam:   RRR   Normal S1, S2   No murmurs Abd   Supple  No hepatomegaly  No masses Ext No LE edema     EKG EKG NSR  RBBB  ASSESSMENT AND PLAN:   1  CAD  Pt with CAD on CT  Myoview in 2017 without significant ischemia   Denies CP  Breathing is stable   2   PAD  Follows with [facility].   s/p aortobifem bypasses and R fem/pop bypass in 2018  Notes some claudication in R leg     Needs to have follow up in [facility] Will try to arrange  3  HL  LDL 54  HDL 46 in September 2024  4  HTN  BP is under adequate control   5  CV dz  Mod dz on R ICA, mild L ICA   Keep on statin  6  hx gout    Recent flare   DOing better         7 Renal  Cr 1.64 in Sept 2024  8  COPD  Breathing is stable   Follow up next fall in clinic   [place] PA-C, MHS [facility] 01/12/2024 1:58 PM

## 2024-01-21 DIAGNOSIS — I10 Essential (primary) hypertension: Secondary | ICD-10-CM | POA: Diagnosis not present

## 2024-01-21 DIAGNOSIS — M1A9XX1 Chronic gout, unspecified, with tophus (tophi): Secondary | ICD-10-CM | POA: Diagnosis not present

## 2024-01-21 DIAGNOSIS — R7301 Impaired fasting glucose: Secondary | ICD-10-CM | POA: Diagnosis not present

## 2024-01-27 ENCOUNTER — Other Ambulatory Visit (HOSPITAL_COMMUNITY): Payer: Self-pay | Admitting: Family Medicine

## 2024-01-27 DIAGNOSIS — I739 Peripheral vascular disease, unspecified: Secondary | ICD-10-CM | POA: Diagnosis not present

## 2024-01-27 DIAGNOSIS — E782 Mixed hyperlipidemia: Secondary | ICD-10-CM | POA: Diagnosis not present

## 2024-01-27 DIAGNOSIS — N1831 Chronic kidney disease, stage 3a: Secondary | ICD-10-CM | POA: Diagnosis not present

## 2024-01-27 DIAGNOSIS — L409 Psoriasis, unspecified: Secondary | ICD-10-CM | POA: Diagnosis not present

## 2024-01-27 DIAGNOSIS — J441 Chronic obstructive pulmonary disease with (acute) exacerbation: Secondary | ICD-10-CM

## 2024-01-27 DIAGNOSIS — I129 Hypertensive chronic kidney disease with stage 1 through stage 4 chronic kidney disease, or unspecified chronic kidney disease: Secondary | ICD-10-CM | POA: Diagnosis not present

## 2024-01-27 DIAGNOSIS — R7301 Impaired fasting glucose: Secondary | ICD-10-CM | POA: Diagnosis not present

## 2024-01-27 DIAGNOSIS — Z9862 Peripheral vascular angioplasty status: Secondary | ICD-10-CM | POA: Diagnosis not present

## 2024-01-27 DIAGNOSIS — M1A9XX1 Chronic gout, unspecified, with tophus (tophi): Secondary | ICD-10-CM | POA: Diagnosis not present

## 2024-01-27 DIAGNOSIS — E875 Hyperkalemia: Secondary | ICD-10-CM | POA: Diagnosis not present

## 2024-01-27 DIAGNOSIS — Z872 Personal history of diseases of the skin and subcutaneous tissue: Secondary | ICD-10-CM | POA: Diagnosis not present

## 2024-01-27 DIAGNOSIS — I10 Essential (primary) hypertension: Secondary | ICD-10-CM | POA: Diagnosis not present

## 2024-01-27 DIAGNOSIS — Z860100 Personal history of colon polyps, unspecified: Secondary | ICD-10-CM | POA: Diagnosis not present

## 2024-01-29 ENCOUNTER — Ambulatory Visit (HOSPITAL_COMMUNITY)
Admission: RE | Admit: 2024-01-29 | Discharge: 2024-01-29 | Disposition: A | Discharge status: Home / Self Care | Source: Ambulatory Visit | Attending: Family Medicine | Admitting: Family Medicine

## 2024-01-29 DIAGNOSIS — R918 Other nonspecific abnormal finding of lung field: Secondary | ICD-10-CM | POA: Diagnosis not present

## 2024-01-29 DIAGNOSIS — J441 Chronic obstructive pulmonary disease with (acute) exacerbation: Secondary | ICD-10-CM | POA: Insufficient documentation

## 2024-04-02 ENCOUNTER — Other Ambulatory Visit: Payer: Self-pay | Admitting: *Deleted

## 2024-04-02 DIAGNOSIS — I739 Peripheral vascular disease, unspecified: Secondary | ICD-10-CM

## 2024-04-02 DIAGNOSIS — Z95828 Presence of other vascular implants and grafts: Secondary | ICD-10-CM

## 2024-04-06 ENCOUNTER — Ambulatory Visit

## 2024-04-06 ENCOUNTER — Ambulatory Visit: Payer: Medicare Other | Admitting: Vascular Surgery

## 2024-04-06 ENCOUNTER — Other Ambulatory Visit: Payer: Medicare Other

## 2024-04-06 ENCOUNTER — Ambulatory Visit: Admitting: Vascular Surgery

## 2024-04-06 DIAGNOSIS — I739 Peripheral vascular disease, unspecified: Secondary | ICD-10-CM | POA: Diagnosis not present

## 2024-04-06 DIAGNOSIS — Z95828 Presence of other vascular implants and grafts: Secondary | ICD-10-CM | POA: Diagnosis not present

## 2024-04-06 LAB — VAS US ABI WITH/WO TBI
Left ABI: 0.73
Right ABI: 0.77

## 2024-04-08 ENCOUNTER — Ambulatory Visit (INDEPENDENT_AMBULATORY_CARE_PROVIDER_SITE_OTHER): Admitting: Vascular Surgery

## 2024-04-08 ENCOUNTER — Encounter: Payer: Self-pay | Admitting: Vascular Surgery

## 2024-04-08 VITALS — BP 155/65 | HR 73 | Ht 71.0 in | Wt 184.0 lb

## 2024-04-08 DIAGNOSIS — I739 Peripheral vascular disease, unspecified: Secondary | ICD-10-CM

## 2024-04-08 DIAGNOSIS — Z95828 Presence of other vascular implants and grafts: Secondary | ICD-10-CM | POA: Diagnosis not present

## 2024-04-08 NOTE — Progress Notes (Signed)
 [facility] [place]  Patient name: Dwayne Cuevas MRN: [id number] DOB: 1951/10/24 Sex: male  REASON FOR VISIT: Follow-up peripheral vascular disease  HPI: Dwayne Cuevas is a 73 y.o. male today for follow-up.  He is status post aortobifemoral and right femoral-popliteal bypass with Dr. Farrel Hones in 2018.  He had exploration of his below-knee popliteal at the same setting but no bypass.  His right femoral-popliteal bypass is propatent Gore-Tex he reports no claudication type symptoms.  He does have some unsteady gait and walks with a cane when he is in public and not at home.  04/08/24: Patient returns to clinic for follow-up of prior aortobifemoral bypass and right femoral-popliteal bypass.  He has no claudication symptoms.  He has no rest pain symptoms.  Has no ulcers about his feet.  He does report some chronic back pain.  He also reports dyspnea associate with COPD.  He is trying to exercise is much as he can.  He is stop smoking cigarettes.  Past Medical History:  Diagnosis Date   Cancer (HCC)    skin cancer removed from leg and head   COPD (chronic obstructive pulmonary disease) (HCC)    Coronary artery disease    "a couple blockages on each side of my heart" pt reports this but has not sure if he has had a cath   DVT (deep venous thrombosis) (HCC) 02/05/2016   Gout    Hypercholesteremia    Hypertension    PE (pulmonary embolism) 02/05/2016   Scoliosis     Family History  Problem Relation Age of Onset   Heart disease Mother    Heart disease Father    Cystic fibrosis Paternal Aunt     SOCIAL HISTORY: Social History   Tobacco Use   Smoking status: Former    Current packs/day: 0.00    Types: Cigarettes    Quit date: 08/31/2017    Years since quitting: 6.6   Smokeless tobacco: Never  Substance Use Topics   Alcohol use: No    Allergies  Allergen Reactions   Bee Venom     Swelling, shortness of breathe   Shellfish  Allergy Other (See Comments)    Gout, not allergy, patient doesn't eat because it causes gout   Penicillins Rash and Other (See Comments)    Has patient had a PCN reaction causing immediate rash, facial/tongue/throat swelling, SOB or lightheadedness with hypotension: Yes Has patient had a PCN reaction causing severe rash involving mucus membranes or skin necrosis: No Has patient had a PCN reaction that required hospitalization No Has patient had a PCN reaction occurring within the last 10 years: No If all of the above answers are "NO", then may proceed with Cephalosporin use.     Current Outpatient Medications  Medication Sig Dispense Refill   albuterol  (VENTOLIN  HFA) 108 (90 Base) MCG/ACT inhaler INHALE 2 PUFFS BY MOUTH EVERY 4 HOURS     amLODipine  (NORVASC ) 5 MG tablet Take 1 tablet (5 mg total) by mouth daily. 90 tablet 3   aspirin  EC 81 MG tablet Take 81 mg by mouth daily.     clindamycin  (CLEOCIN ) 150 MG capsule Take 150 mg by mouth as directed. Before dental appointments  3   co-enzyme Q-10 30 MG capsule Take 100 mg by mouth daily.      colchicine  0.6 MG tablet Take 1 tablet (0.6 mg total) by mouth 2 (two) times daily. ON HOLD 180 tablet 3   Cyanocobalamin (VITAMIN B 12  PO) Take 500 mg by mouth daily.     hydrochlorothiazide  (MICROZIDE ) 12.5 MG capsule Take 1 capsule (12.5 mg total) by mouth daily. 90 capsule 3   MAGNESIUM  PO Take 1 tablet by mouth as directed.     nitroGLYCERIN (NITROSTAT) 0.4 MG SL tablet Place 0.4 mg under the tongue every 5 (five) minutes as needed for chest pain. Max 3 doses in 15 minutes     Omega-3 Fatty Acids (FISH OIL) 1000 MG CAPS Take 1,000 mg by mouth daily.     vitamin C  (ASCORBIC ACID ) 500 MG tablet Take 500 mg by mouth daily.     Vitamin E  100 units TABS Take 100 Units by mouth daily.      Zinc 50 MG CAPS Take 50 mg by mouth daily.     allopurinol  (ZYLOPRIM ) 300 MG tablet Take 300 mg by mouth daily.  (Patient not taking: Reported on 03/24/2023)      guaiFENesin  (MUCUS RELIEF PO) Take by mouth as directed. (Patient not taking: Reported on 04/08/2024)     rosuvastatin  (CRESTOR ) 40 MG tablet Take 1 tablet (40 mg total) by mouth daily. 90 tablet 3   No current facility-administered medications for this visit.    REVIEW OF SYSTEMS:  [X]  denotes positive finding, [ ]  denotes negative finding Cardiac  Comments:  Chest pain or chest pressure:    Shortness of breath upon exertion:    Short of breath when lying flat:    Irregular heart rhythm:        Vascular    Pain in calf, thigh, or hip brought on by ambulation:    Pain in feet at night that wakes you up from your sleep:     Blood clot in your veins:    Leg swelling:           PHYSICAL EXAM: Vitals:   04/08/24 1357  BP: (!) 155/65  Pulse: 73  SpO2: 98%  Weight: 184 lb (83.5 kg)  Height: 5\' 11"  (1.803 m)    GENERAL: The patient is a well-nourished male, in no acute distress. The vital signs are documented above. CARDIOVASCULAR: 2+ radial and 2+ femoral pulses bilaterally.  I do palpate a right popliteal pulse and no pulses on the left PULMONARY: There is good air exchange  MUSCULOSKELETAL: There are no major deformities or cyanosis. NEUROLOGIC: No focal weakness or paresthesias are detected. SKIN: There are no ulcers or rashes noted. PSYCHIATRIC: The patient has a normal affect.  DATA:   +-------+-----------+-----------+------------+------------+  ABI/TBIToday's ABIToday's TBIPrevious ABIPrevious TBI  +-------+-----------+-----------+------------+------------+  Right 0.77       0.58       0.84        0.56          +-------+-----------+-----------+------------+------------+  Left  0.73       0.42       0.85        0.54          +-------+-----------+-----------+------------+------------+      +----------+--------+-----+--------+--------+--------+  RIGHT    PSV cm/sRatioStenosisWaveformComments   +----------+--------+-----+--------+--------+--------+  ATA Distal38                   biphasic          +----------+--------+-----+--------+--------+--------+  PTA Distal39                   biphasic          +----------+--------+-----+--------+--------+--------+     Right Graft #1: Fem-BK pop  +------------------+--------+--------+----------+--------+  PSV cm/sStenosisWaveform  Comments  +------------------+--------+--------+----------+--------+  Inflow           100             monophasic          +------------------+--------+--------+----------+--------+  Prox Anastomosis  144             biphasic            +------------------+--------+--------+----------+--------+  Proximal Graft    94              biphasic            +------------------+--------+--------+----------+--------+  Mid Graft         105             biphasic            +------------------+--------+--------+----------+--------+  Distal Graft      55              biphasic            +------------------+--------+--------+----------+--------+  Distal Anastomosis169             monophasic          +------------------+--------+--------+----------+--------+  Outflow          52              biphasic            +------------------+--------+--------+----------+--------+      Summary:  Right: Patent graft with no significant stenosis identified.   Essman/plan: COWAN PILAR is a 73 y.o. male status post aortobifemoral bypass and right femoral-popliteal bypass grafting for ischemic rest pain in 2018.  He is doing well.  Recommend:  Abstinence from all tobacco products. Blood glucose control with goal A1c < 7%. Blood pressure control with goal blood pressure < 140/90 mmHg. Lipid reduction therapy with goal LDL-C <55 mg/dL  Aspirin  81mg  PO QD.  Atorvastatin 40-80mg  PO QD (or other "high intensity" statin therapy).  Follow-up in 1 year with  ankle-brachial index  Heber Little. Edgardo Goodwill, MD [employer] [facility] Phone Number: [phone] 04/08/2024 2:51 PM

## 2024-04-17 ENCOUNTER — Other Ambulatory Visit: Payer: Self-pay | Admitting: Internal Medicine

## 2024-06-23 DIAGNOSIS — M1A9XX1 Chronic gout, unspecified, with tophus (tophi): Secondary | ICD-10-CM | POA: Diagnosis not present

## 2024-06-23 DIAGNOSIS — R7301 Impaired fasting glucose: Secondary | ICD-10-CM | POA: Diagnosis not present

## 2024-06-23 DIAGNOSIS — I10 Essential (primary) hypertension: Secondary | ICD-10-CM | POA: Diagnosis not present

## 2024-06-29 DIAGNOSIS — L97509 Non-pressure chronic ulcer of other part of unspecified foot with unspecified severity: Secondary | ICD-10-CM | POA: Diagnosis not present

## 2024-06-29 DIAGNOSIS — N1831 Chronic kidney disease, stage 3a: Secondary | ICD-10-CM | POA: Diagnosis not present

## 2024-06-29 DIAGNOSIS — E782 Mixed hyperlipidemia: Secondary | ICD-10-CM | POA: Diagnosis not present

## 2024-06-29 DIAGNOSIS — L409 Psoriasis, unspecified: Secondary | ICD-10-CM | POA: Diagnosis not present

## 2024-06-29 DIAGNOSIS — R7301 Impaired fasting glucose: Secondary | ICD-10-CM | POA: Diagnosis not present

## 2024-06-29 DIAGNOSIS — J449 Chronic obstructive pulmonary disease, unspecified: Secondary | ICD-10-CM | POA: Diagnosis not present

## 2024-06-29 DIAGNOSIS — I10 Essential (primary) hypertension: Secondary | ICD-10-CM | POA: Diagnosis not present

## 2024-06-29 DIAGNOSIS — M1A9XX1 Chronic gout, unspecified, with tophus (tophi): Secondary | ICD-10-CM | POA: Diagnosis not present

## 2024-06-29 DIAGNOSIS — I129 Hypertensive chronic kidney disease with stage 1 through stage 4 chronic kidney disease, or unspecified chronic kidney disease: Secondary | ICD-10-CM | POA: Diagnosis not present

## 2024-06-29 DIAGNOSIS — E875 Hyperkalemia: Secondary | ICD-10-CM | POA: Diagnosis not present

## 2024-06-29 DIAGNOSIS — I739 Peripheral vascular disease, unspecified: Secondary | ICD-10-CM | POA: Diagnosis not present

## 2024-06-29 DIAGNOSIS — Z860100 Personal history of colon polyps, unspecified: Secondary | ICD-10-CM | POA: Diagnosis not present

## 2024-07-01 ENCOUNTER — Encounter (INDEPENDENT_AMBULATORY_CARE_PROVIDER_SITE_OTHER): Payer: Self-pay | Admitting: *Deleted

## 2024-07-16 ENCOUNTER — Telehealth: Payer: Self-pay | Admitting: Internal Medicine

## 2024-07-16 MED ORDER — AMLODIPINE BESYLATE 5 MG PO TABS: 5.0000 mg | ORAL_TABLET | Freq: Every day | ORAL | 1 refills | Status: AC

## 2024-07-16 MED ORDER — HYDROCHLOROTHIAZIDE 12.5 MG PO CAPS: 12.5000 mg | ORAL_CAPSULE | Freq: Every day | ORAL | 1 refills | Status: DC

## 2024-07-16 NOTE — Telephone Encounter (Signed)
 Pt's medications were sent to pt's pharmacy as requested. Confirmation received.

## 2024-07-16 NOTE — Telephone Encounter (Signed)
*  STAT* If patient is at the pharmacy, call can be transferred to refill team.   1. Which medications need to be refilled? (please list name of each medication and dose if known)    amLODipine  (NORVASC ) 5 MG tablet    hydrochlorothiazide  (MICROZIDE ) 12.5 MG capsule     4. Which pharmacy/location (including street and city if local pharmacy) is medication to be sent to?  [id number] - [place], [place] - [address]     5. Do they need a 30 day or 90 day supply? 90    Pt scheduled   10/04/2024

## 2024-08-06 DIAGNOSIS — Z23 Encounter for immunization: Secondary | ICD-10-CM | POA: Diagnosis not present

## 2024-10-02 NOTE — Progress Notes (Unsigned)
 10/02/2024 Dwayne Cuevas   [birth year]  [id number]  Primary Physician Shona Norleen PEDLAR, MD Primary Cardiologist: Dr. Okey  F/u of CAD    HPI: Patient is a 73 yo  with a history of PVOD, COPD, PE and coronary calcifications  GXT Myovue in 2017  Suspicious for variable soft tissue attenuation vs small region of apical ischemia   LVEF 70%  2D echo on 06/11/17 showed normal LVEF at 60-65% with normal wall motion without regional abnormalities. There was mild AI. The aortic root and ascending aorta were normal in size.  I saw the [name]  Since seen he has done OK from a cardiac standpoint   Denies CP Breathing is ok  No dizziness Does have problems with gout in hand   Took colchicine  for a short bit He does have some cramping in R leg with walking  He was previously followed by [facility]  Needs follow up in [facility]  I saw the [name]  No outpatient medications have been marked as taking for the 10/04/24 encounter (Appointment) with Okey Vina GAILS, MD.   Allergies  Allergen Reactions   Bee Venom     Swelling, shortness of breathe   Shellfish Allergy Other (See Comments)    Gout, not allergy, patient doesn't eat because it causes gout   Penicillins Rash and Other (See Comments)    Has patient had a PCN reaction causing immediate rash, facial/tongue/throat swelling, SOB or lightheadedness with hypotension: Yes Has patient had a PCN reaction causing severe rash involving mucus membranes or skin necrosis: No Has patient had a PCN reaction that required hospitalization No Has patient had a PCN reaction occurring within the last 10 years: No If all of the above answers are NO, then may proceed with Cephalosporin use.    Past Medical History:  Diagnosis Date   Cancer (HCC)    skin cancer removed from leg and head   COPD (chronic obstructive pulmonary disease) (HCC)    Coronary artery disease    a couple blockages on each side of my heart pt reports this but has not  sure if he has had a cath   DVT (deep venous thrombosis) (HCC) 02/05/2016   Gout    Hypercholesteremia    Hypertension    PE (pulmonary embolism) 02/05/2016   Scoliosis    Family History  Problem Relation Age of Onset   Heart disease Mother    Heart disease Father    Cystic fibrosis Paternal Aunt    Past Surgical History:  Procedure Laterality Date   ABDOMINAL AORTOGRAM W/LOWER EXTREMITY N/A 09/04/2017   Procedure: ABDOMINAL AORTOGRAM W/LOWER EXTREMITY;  Surgeon: Laurence Redell CROME, MD;  Location: [facility] INVASIVE CV LAB;  Service: Cardiovascular;  Laterality: N/A;   AORTA - BILATERAL FEMORAL ARTERY BYPASS GRAFT Bilateral 10/14/2017   Procedure: AORTA BIFEMORAL BYPASS GRAFT;  Surgeon: Laurence Redell CROME, MD;  Location: [facility] OR;  Service: Vascular;  Laterality: Bilateral;   FEMORAL-POPLITEAL BYPASS GRAFT Right 10/14/2017   Procedure: Right FEMORAL-POPLITEAL ARTERY Bypass graft using gortex graft;  Surgeon: Laurence Redell CROME, MD;  Location: [facility] OR;  Service: Vascular;  Laterality: Right;   FOOT SURGERY Left    put my foot back together    INTRAOPERATIVE ARTERIOGRAM Left 10/14/2017   Procedure: INTRA OPERATIVE ARTERIOGRAM;  Surgeon: Laurence Redell CROME, MD;  Location: [facility] OR;  Service: Vascular;  Laterality: Left;   NECK SURGERY     discs  removed, steel plate   PERIPHERAL VASCULAR INTERVENTION Left 09/04/2017   Procedure: PERIPHERAL VASCULAR INTERVENTION;  Surgeon: Laurence Redell CROME, MD;  Location: [facility] INVASIVE CV LAB;  Service: Cardiovascular;  Laterality: Left;  left common iliac   SKIN CANCER EXCISION     THROMBECTOMY FEMORAL ARTERY Left 10/14/2017   Procedure: THROMBECTOMY Left Lower leg;  Surgeon: Laurence Redell CROME, MD;  Location: [facility] OR;  Service: Vascular;  Laterality: Left;   Social History   Socioeconomic History   Marital status: Divorced    Spouse name: Not on file   Number of children: Not on file   Years of education: Not on file   Highest education level: Not on file  Occupational History   Not on  file  Tobacco Use   Smoking status: Former    Current packs/day: 0.00    Types: Cigarettes    Quit date: 08/31/2017    Years since quitting: 7.0   Smokeless tobacco: Never  Vaping Use   Vaping status: Never Used  Substance and Sexual Activity   Alcohol use: No   Drug use: No   Sexual activity: Not on file  Other Topics Concern   Not on file  Social History Narrative   Not on file   Social Drivers of Health   Financial Resource Strain: Not on file  Food Insecurity: Not on file  Transportation Needs: Not on file  Physical Activity: Not on file  Stress: Not on file  Social Connections: Not on file  Intimate Partner Violence: Not on file     Review of Systems:  All other systems reviewed and are otherwise negative except as noted above.   Physical Exam:  There were no vitals taken for this visit.    Pt is in NAD  HEENT:   NCAT   Neck:   JVP not elevated    No carotid bruits    Lungs Moving air OK  Cardiac exam:   RRR   Normal S1, S2   No murmurs Abd   Supple  No hepatomegaly  No masses Ext No LE edema     EKG EKG NSR  RBBB  ASSESSMENT AND PLAN:   1  CAD  Pt with CAD on CT     Myoview  in 2017 without significant ischemia   Denies CP  Breathing is stable   2   PAD  Follows with [facility].   s/p aortobifem bypasses and R fem/pop bypass in 2018  Notes some claudication in R leg     Needs to have follow up in [facility] Will try to arrange  3  HL  LDL 54  HDL 46 in September 2024  4  HTN  BP is under adequate control   5  CV dz  Mod dz on R ICA, mild L ICA   Keep on statin  6  hx gout    Recent flare   DOing better         7 Renal  Cr 1.64 in Sept 2024  8  COPD  Breathing is stable   Follow up next fall in clinic   [place] PA-C, [facility] 10/02/2024 9:45 PM

## 2024-10-04 ENCOUNTER — Ambulatory Visit: Attending: Internal Medicine | Admitting: Internal Medicine

## 2024-10-04 VITALS — BP 147/97 | HR 76 | Ht 71.0 in | Wt 190.0 lb

## 2024-10-04 DIAGNOSIS — M1000 Idiopathic gout, unspecified site: Secondary | ICD-10-CM | POA: Insufficient documentation

## 2024-10-04 DIAGNOSIS — Z79899 Other long term (current) drug therapy: Secondary | ICD-10-CM | POA: Diagnosis not present

## 2024-10-04 MED ORDER — CARVEDILOL 3.125 MG PO TABS: 3.1250 mg | ORAL_TABLET | Freq: Two times a day (BID) | ORAL | 3 refills | Status: AC

## 2024-10-04 MED ORDER — ROSUVASTATIN CALCIUM 40 MG PO TABS
20.0000 mg | ORAL_TABLET | Freq: Every day | ORAL | 2 refills | Status: AC
Start: 2024-10-04 — End: ?

## 2024-10-04 NOTE — Patient Instructions (Addendum)
 Medication Instructions:   Stop taking Hydrochlorothiazide   Start Coreg 3.125 Two Times Daily  Decrease Crestor  to 20 mg Daily   *If you need a refill on your cardiac medications before your next appointment, please call your pharmacy*  Lab Work: Your physician recommends that you return for lab work in: 4 Weeks   If you have labs (blood work) drawn today and your tests are completely normal, you will receive your results only by: [employer] (if you have MyChart) OR A paper copy in the mail If you have any lab test that is abnormal or we need to change your treatment, we will call you to review the results.  Testing/Procedures: NONE   Follow-Up: At [facility], you and your health needs are our priority.  As part of our continuing mission to provide you with exceptional heart care, our providers are all part of one team.  This team includes your primary Cardiologist (physician) and Advanced Practice Providers or APPs (Physician Assistants and Nurse Practitioners) who all work together to provide you with the care you need, when you need it.  Your next appointment:    August 2026   Provider:   You may see Vina Gull, MD or one of the following Advanced Practice Providers on your designated Care Team:   Laymon Qua, PA-C  [place], [place] Olivia P[place], [place]     We recommend signing up for the patient portal called MyChart.  Sign up information is provided on this After Visit Summary.  MyChart is used to connect with patients for Virtual Visits (Telemedicine).  Patients are able to view lab/test results, encounter notes, upcoming appointments, etc.  Non-urgent messages can be sent to your provider as well.   To learn more about what you can do with MyChart, go to [email or web address].   Other Instructions Thank you for choosing [facility]!

## 2024-11-03 ENCOUNTER — Other Ambulatory Visit: Payer: Self-pay

## 2024-11-03 DIAGNOSIS — Z79899 Other long term (current) drug therapy: Secondary | ICD-10-CM

## 2024-11-04 ENCOUNTER — Ambulatory Visit: Payer: Self-pay | Admitting: Internal Medicine

## 2024-11-04 LAB — BASIC METABOLIC PANEL WITH GFR
BUN/Creatinine Ratio: 13 (ref 10–24)
BUN: 17 mg/dL (ref 8–27)
CO2: 26 mmol/L (ref 20–29)
Calcium: 9.8 mg/dL (ref 8.6–10.2)
Chloride: 104 mmol/L (ref 96–106)
Creatinine, Ser: 1.28 mg/dL — ABNORMAL HIGH (ref 0.76–1.27)
Glucose: 88 mg/dL (ref 70–99)
Potassium: 4.6 mmol/L (ref 3.5–5.2)
Sodium: 145 mmol/L — ABNORMAL HIGH (ref 134–144)
eGFR: 59 mL/min/1.73 — ABNORMAL LOW (ref >59)

## 2024-11-04 LAB — URIC ACID: Uric Acid: 7.5 mg/dL (ref 3.8–8.4)
